# Patient Record
Sex: Female | Born: 1960 | Race: White | Hispanic: No | Marital: Married | Smoking: Never smoker
Health system: Southern US, Community
[De-identification: ages and names within clinical notes are randomized; demographics above are authoritative.]

## PROBLEM LIST (undated history)

## (undated) DIAGNOSIS — I428 Other cardiomyopathies: Secondary | ICD-10-CM

## (undated) DIAGNOSIS — K219 Gastro-esophageal reflux disease without esophagitis: Secondary | ICD-10-CM

## (undated) DIAGNOSIS — I5041 Acute combined systolic (congestive) and diastolic (congestive) heart failure: Secondary | ICD-10-CM

## (undated) DIAGNOSIS — Z9889 Other specified postprocedural states: Secondary | ICD-10-CM

## (undated) DIAGNOSIS — I472 Ventricular tachycardia: Secondary | ICD-10-CM

## (undated) DIAGNOSIS — I509 Heart failure, unspecified: Secondary | ICD-10-CM

## (undated) DIAGNOSIS — E119 Type 2 diabetes mellitus without complications: Secondary | ICD-10-CM

## (undated) DIAGNOSIS — T7840XA Allergy, unspecified, initial encounter: Secondary | ICD-10-CM

## (undated) DIAGNOSIS — I493 Ventricular premature depolarization: Secondary | ICD-10-CM

## (undated) HISTORY — DX: Allergy, unspecified, initial encounter: T78.40XA

## (undated) HISTORY — DX: Gastro-esophageal reflux disease without esophagitis: K21.9

## (undated) HISTORY — DX: Type 2 diabetes mellitus without complications: E11.9

## (undated) HISTORY — PX: TONSILLECTOMY: SUR1361

## (undated) HISTORY — DX: Ventricular premature depolarization: I49.3

---

## 1998-05-29 ENCOUNTER — Other Ambulatory Visit: Admission: RE | Admit: 1998-05-29 | Discharge: 1998-05-29 | Payer: Self-pay | Admitting: Obstetrics & Gynecology

## 1998-07-20 ENCOUNTER — Ambulatory Visit (HOSPITAL_COMMUNITY): Admission: RE | Admit: 1998-07-20 | Discharge: 1998-07-20 | Payer: Self-pay | Admitting: Obstetrics and Gynecology

## 1998-10-05 ENCOUNTER — Inpatient Hospital Stay (HOSPITAL_COMMUNITY): Admission: AD | Admit: 1998-10-05 | Discharge: 1998-10-05 | Payer: Self-pay | Admitting: Obstetrics and Gynecology

## 1998-11-06 ENCOUNTER — Encounter: Admission: RE | Admit: 1998-11-06 | Discharge: 1999-02-04 | Payer: Self-pay | Admitting: *Deleted

## 1998-11-18 ENCOUNTER — Ambulatory Visit (HOSPITAL_COMMUNITY): Admission: RE | Admit: 1998-11-18 | Discharge: 1998-11-18 | Payer: Self-pay | Admitting: Obstetrics and Gynecology

## 1998-12-16 ENCOUNTER — Inpatient Hospital Stay (HOSPITAL_COMMUNITY): Admission: AD | Admit: 1998-12-16 | Discharge: 1998-12-19 | Payer: Self-pay | Admitting: Obstetrics and Gynecology

## 2011-07-28 ENCOUNTER — Ambulatory Visit (INDEPENDENT_AMBULATORY_CARE_PROVIDER_SITE_OTHER): Payer: 59

## 2011-07-28 DIAGNOSIS — Z0000 Encounter for general adult medical examination without abnormal findings: Secondary | ICD-10-CM

## 2011-07-28 DIAGNOSIS — E559 Vitamin D deficiency, unspecified: Secondary | ICD-10-CM

## 2011-07-28 DIAGNOSIS — Z833 Family history of diabetes mellitus: Secondary | ICD-10-CM

## 2011-10-05 ENCOUNTER — Telehealth: Payer: Self-pay

## 2011-10-05 NOTE — Telephone Encounter (Signed)
CPE from Jul 28, 2011 mailed to patient.

## 2011-10-05 NOTE — Telephone Encounter (Signed)
Pt is requesting a copy of her complete pe be mailed to her for her records

## 2012-02-09 LAB — HM MAMMOGRAPHY

## 2012-04-10 ENCOUNTER — Telehealth: Payer: Self-pay | Admitting: Radiology

## 2012-04-10 DIAGNOSIS — R928 Other abnormal and inconclusive findings on diagnostic imaging of breast: Secondary | ICD-10-CM

## 2012-04-10 NOTE — Telephone Encounter (Signed)
Patient has failed to follow up with her abnormal mammogram. I have spoken to patient her insurance company has told her the mammogram she had was covered at 100% this was done through her husbands work on a mobile unit. Now she has gotten a bill for out of network charges from the mammogram. I have advised her to call insurance company about this find out who is in network so we can proceed with a diagnostic mammogram for her. Patient also needs to get copy of mammogram that was done and report. I will call her back about this, or she will call me. I have told her I will give her time to find out what her insurance company will cover.

## 2012-04-18 NOTE — Telephone Encounter (Signed)
I called patient again, about this and she wants me to proceed with the order for diagnostic Mammogram. She still has not worked out everything with [employer]. I have put in order for this, want to let you know. I ordered as diagnostic mammogram with [facility] if something is seen.

## 2012-04-23 ENCOUNTER — Telehealth: Payer: Self-pay

## 2012-04-23 NOTE — Telephone Encounter (Signed)
The patient called to request that [facility] be on the look out for her paperwork from [facility].  The patient would like to be notified when the paperwork comes through and would like a copy of the paperwork as well.  Please call the patient at [phone].

## 2012-04-23 NOTE — Telephone Encounter (Signed)
Spoke with patient. We have the mammogram report from [facility]. Will go in [place] [facility]'s box for review. [facility] will be coming in the mail some time this week as well.

## 2012-04-24 ENCOUNTER — Encounter: Payer: Self-pay | Admitting: Physician Assistant

## 2012-04-24 NOTE — Telephone Encounter (Signed)
It is fine to go ahead and give the patient a copy of her MMG report and then please put it back in my box.  Thanks, angela

## 2012-04-25 NOTE — Telephone Encounter (Signed)
Notified pt that we received the MMG report from [facility] and mailed pt a copy. Pt requests call when we receive the disc as well so that she can p/up and take to [facility].

## 2012-04-29 ENCOUNTER — Encounter: Payer: Self-pay | Admitting: Physician Assistant

## 2012-04-29 NOTE — Telephone Encounter (Signed)
We won't get a disc of it.  She will need to request that from [facility] where she got the mammogram done.

## 2012-04-30 NOTE — Telephone Encounter (Signed)
Called patient, we have gotten report and mailed to her. I told her normally we do not get discs but if we do I will let her know. She is going to call them back and have disc mailed to her.

## 2012-05-02 ENCOUNTER — Encounter: Payer: Self-pay | Admitting: Family Medicine

## 2012-05-03 ENCOUNTER — Encounter: Payer: Self-pay | Admitting: Physician Assistant

## 2012-05-04 ENCOUNTER — Other Ambulatory Visit: Payer: Self-pay | Admitting: Physician Assistant

## 2012-05-04 ENCOUNTER — Ambulatory Visit
Admission: RE | Admit: 2012-05-04 | Discharge: 2012-05-04 | Disposition: A | Discharge status: Home / Self Care | Payer: 59 | Source: Ambulatory Visit | Attending: Physician Assistant | Admitting: Physician Assistant

## 2012-05-04 DIAGNOSIS — R928 Other abnormal and inconclusive findings on diagnostic imaging of breast: Secondary | ICD-10-CM

## 2012-06-27 ENCOUNTER — Encounter: Payer: 59 | Admitting: Family Medicine

## 2012-07-27 ENCOUNTER — Ambulatory Visit (INDEPENDENT_AMBULATORY_CARE_PROVIDER_SITE_OTHER): Payer: 59 | Admitting: Family Medicine

## 2012-07-27 ENCOUNTER — Encounter: Payer: Self-pay | Admitting: Family Medicine

## 2012-07-27 VITALS — BP 120/76 | HR 85 | Temp 98.5°F | Resp 16 | Ht 59.5 in | Wt 154.0 lb

## 2012-07-27 DIAGNOSIS — E7800 Pure hypercholesterolemia, unspecified: Secondary | ICD-10-CM

## 2012-07-27 DIAGNOSIS — Z0000 Encounter for general adult medical examination without abnormal findings: Secondary | ICD-10-CM

## 2012-07-27 DIAGNOSIS — Z8249 Family history of ischemic heart disease and other diseases of the circulatory system: Secondary | ICD-10-CM

## 2012-07-27 DIAGNOSIS — Z8349 Family history of other endocrine, nutritional and metabolic diseases: Secondary | ICD-10-CM

## 2012-07-27 LAB — POCT URINALYSIS DIPSTICK
Bilirubin, UA: NEGATIVE
Blood, UA: NEGATIVE
Glucose, UA: NEGATIVE
Nitrite, UA: NEGATIVE
Urobilinogen, UA: 0.2

## 2012-07-27 LAB — BASIC METABOLIC PANEL
BUN: 12 mg/dL (ref 6–23)
Chloride: 103 mEq/L (ref 96–112)
Potassium: 4.1 mEq/L (ref 3.5–5.3)

## 2012-07-27 LAB — LDL CHOLESTEROL, DIRECT: Direct LDL: 129 mg/dL — ABNORMAL HIGH

## 2012-07-27 LAB — TSH: TSH: 0.938 u[IU]/mL (ref 0.350–4.500)

## 2012-07-27 LAB — T3, FREE: T3, Free: 3.2 pg/mL (ref 2.3–4.2)

## 2012-07-27 NOTE — Patient Instructions (Signed)
Keeping You Healthy  Get These Tests  Blood Pressure- Have your blood pressure checked by your healthcare provider at least once a year.  Normal blood pressure is 120/80.  Weight- Have your body mass index (BMI) calculated to screen for obesity.  BMI is a measure of body fat based on height and weight.  You can calculate your own BMI at https://www.west-esparza.com/  Cholesterol- Have your cholesterol checked every year.  Diabetes- Have your blood sugar checked every year if you have high blood pressure, high cholesterol, a family history of diabetes or if you are overweight.  Pap Smear- Have a pap smear every 1 to 3 years if you have been sexually active.  If you are older than 65 and recent pap smears have been normal you may not need additional pap smears.  In addition, if you have had a hysterectomy  For benign disease additional pap smears are not necessary.  Mammogram-Yearly mammograms are essential for early detection of breast cancer  Screening for Colon Cancer- Colonoscopy starting at age 67. Screening may begin sooner depending on your family history and other health conditions.  Follow up colonoscopy as directed by your Gastroenterologist.  Screening for Osteoporosis- Screening begins at age 58 with bone density scanning, sooner if you are at higher risk for developing Osteoporosis.  Get these medicines  Calcium with Vitamin D- Your body requires 1200-1500 mg of Calcium a day and [phone] IU of Vitamin D a day.  You can only absorb 500 mg of Calcium at a time therefore Calcium must be taken in 2 or 3 separate doses throughout the day.  Hormones- Hormone therapy has been associated with increased risk for certain cancers and heart disease.  Talk to your healthcare provider about if you need relief from menopausal symptoms.  Aspirin- Ask your healthcare provider about taking Aspirin to prevent Heart Disease and Stroke.  Get these Immuniztions  Flu shot- Every fall  Pneumonia  shot- Once after the age of 91; if you are younger ask your healthcare provider if you need a pneumonia shot.  Tetanus- Every ten years. You are allergic to this vaccine.  Zostavax- Once after the age of 26 to prevent shingles.  Take these steps  Don't smoke- Your healthcare provider can help you quit. For tips on how to quit, ask your healthcare provider or go to www.smokefree.gov or call 1-800 QUIT-NOW.  Be physically active- Exercise 5 days a week for a minimum of 30 minutes.  If you are not already physically active, start slow and gradually work up to 30 minutes of moderate physical activity.  Try walking, dancing, bike riding, swimming, etc.  Eat a healthy diet- Eat a variety of healthy foods such as fruits, vegetables, whole grains, low fat milk, low fat cheeses, yogurt, lean meats, chicken, fish, eggs, dried beans, tofu, etc.  For more information go to www.thenutritionsource.org  Dental visit- Brush and floss teeth twice daily; visit your dentist twice a year.  Eye exam- Visit your Optometrist or Ophthalmologist yearly.  Drink alcohol in moderation- Limit alcohol intake to one drink or less a day.  Never drink and drive.  Depression- Your emotional health is as important as your physical health.  If you're feeling down or losing interest in things you normally enjoy, please talk to your healthcare provider.  Seat Belts- can save your life; always wear one  Smoke/Carbon Monoxide detectors- These detectors need to be installed on the appropriate level of your home.  Replace batteries at least once  a year.  Violence- If anyone is threatening or hurting you, please tell your healthcare provider.  Living Will/ Health care power of attorney- Discuss with your healthcare provider and family.   ECG (EKG) today is normal.

## 2012-08-01 ENCOUNTER — Encounter: Payer: Self-pay | Admitting: Family Medicine

## 2012-08-01 DIAGNOSIS — E7800 Pure hypercholesterolemia, unspecified: Secondary | ICD-10-CM | POA: Insufficient documentation

## 2012-08-01 DIAGNOSIS — Z8349 Family history of other endocrine, nutritional and metabolic diseases: Secondary | ICD-10-CM | POA: Insufficient documentation

## 2012-08-01 DIAGNOSIS — E119 Type 2 diabetes mellitus without complications: Secondary | ICD-10-CM | POA: Insufficient documentation

## 2012-08-01 DIAGNOSIS — Z8249 Family history of ischemic heart disease and other diseases of the circulatory system: Secondary | ICD-10-CM | POA: Insufficient documentation

## 2012-08-01 NOTE — Progress Notes (Signed)
  Subjective:    Patient ID: Emily [name], female    DOB: 1960/11/08, 52 y.o.   MRN: [id number]  HPI This 52 y.o. Cauc female is here for CPE. She takes not prescription medications. Last PAP  was 2 years ago. MMG was 02/08/2012 (normal). She had Bone Density study > 12 years ago.   PMHx is notable for Gestational Diabetes.     Review of Systems  HENT: Positive for neck pain and sinus pressure.   All other systems reviewed and are negative.       Objective:   Physical Exam  Nursing note and vitals reviewed. Constitutional: She is oriented to person, place, and time. Vital signs are normal. She appears well-developed and well-nourished.  HENT:  Head: Normocephalic and atraumatic.  Right Ear: Hearing, tympanic membrane, external ear and ear canal normal.  Left Ear: Hearing, tympanic membrane, external ear and ear canal normal.  Nose: No nasal deformity or septal deviation. Right sinus exhibits no maxillary sinus tenderness and no frontal sinus tenderness. Left sinus exhibits no maxillary sinus tenderness and no frontal sinus tenderness.  Mouth/Throat: Uvula is midline, oropharynx is clear and moist and mucous membranes are normal. No oral lesions. Normal dentition. No dental caries.  Eyes: Conjunctivae normal, EOM and lids are normal. Pupils are equal, round, and reactive to light. No scleral icterus.  Fundoscopic exam:      The right eye shows no arteriolar narrowing, no AV nicking and no papilledema. The right eye shows red reflex.      The left eye shows no arteriolar narrowing, no AV nicking and no papilledema. The left eye shows red reflex. Neck: Normal range of motion. Neck supple. No thyromegaly present.  Cardiovascular: Normal rate, regular rhythm, normal heart sounds and intact distal pulses.  Exam reveals no gallop and no friction rub.   No murmur heard. Pulmonary/Chest: Effort normal and breath sounds normal. No respiratory distress. She exhibits no tenderness.  Abdominal:  Soft. Normal appearance and bowel sounds are normal. She exhibits no distension, no pulsatile liver, no abdominal bruit, no pulsatile midline mass and no mass. There is no hepatosplenomegaly. There is no tenderness. There is no guarding and no CVA tenderness.  Genitourinary: Rectum normal. Rectal exam shows no fissure, no mass, no tenderness and anal tone normal. Guaiac negative stool. No breast swelling, tenderness, discharge or bleeding. There is no rash or lesion on the right labia. There is no rash or lesion on the left labia.       NEFG; Pelvic/PAP not performed.  Musculoskeletal: Normal range of motion. She exhibits no edema.  Lymphadenopathy:    She has no cervical adenopathy.  Neurological: She is alert and oriented to person, place, and time. She has normal reflexes. No cranial nerve deficit. She exhibits normal muscle tone. Coordination normal.  Skin: Skin is warm and dry. No rash noted. No erythema. No pallor.  Psychiatric: She has a normal mood and affect. Her behavior is normal. Judgment and thought content normal.    ECG: NSR; normal ECG.      Assessment & Plan:   1. Annual physical exam   Basic metabolic panel, IFOBT POC (occult bld, rslt in office)  2. Family history of early CAD  EKG 12-Lead  3. Elevated LDL cholesterol level  LDL Cholesterol, Direct  4. Family history of thyroid disorder  TSH, T3, Free

## 2012-08-06 ENCOUNTER — Encounter: Payer: Self-pay | Admitting: Family Medicine

## 2012-09-15 ENCOUNTER — Other Ambulatory Visit: Payer: Self-pay

## 2013-06-06 ENCOUNTER — Other Ambulatory Visit: Payer: Self-pay

## 2013-07-30 ENCOUNTER — Ambulatory Visit (INDEPENDENT_AMBULATORY_CARE_PROVIDER_SITE_OTHER): Payer: 59 | Admitting: Family Medicine

## 2013-07-30 ENCOUNTER — Encounter: Payer: Self-pay | Admitting: Family Medicine

## 2013-07-30 VITALS — BP 120/82 | HR 85 | Temp 98.3°F | Resp 16 | Ht 59.5 in | Wt 144.2 lb

## 2013-07-30 DIAGNOSIS — E559 Vitamin D deficiency, unspecified: Secondary | ICD-10-CM

## 2013-07-30 DIAGNOSIS — Z0000 Encounter for general adult medical examination without abnormal findings: Secondary | ICD-10-CM

## 2013-07-30 DIAGNOSIS — L989 Disorder of the skin and subcutaneous tissue, unspecified: Secondary | ICD-10-CM

## 2013-07-30 DIAGNOSIS — Z01419 Encounter for gynecological examination (general) (routine) without abnormal findings: Secondary | ICD-10-CM

## 2013-07-30 LAB — LIPID PANEL
Cholesterol: 193 mg/dL (ref 0–200)
LDL Cholesterol: 117 mg/dL — ABNORMAL HIGH (ref 0–99)
Total CHOL/HDL Ratio: 3.9 Ratio
VLDL: 27 mg/dL (ref 0–40)

## 2013-07-30 LAB — COMPLETE METABOLIC PANEL WITH GFR
ALT: 18 U/L (ref 0–35)
AST: 19 U/L (ref 0–37)
Albumin: 4.5 g/dL (ref 3.5–5.2)
Alkaline Phosphatase: 58 U/L (ref 39–117)
BUN: 16 mg/dL (ref 6–23)
Calcium: 9.4 mg/dL (ref 8.4–10.5)
Chloride: 105 mEq/L (ref 96–112)
Potassium: 4.9 mEq/L (ref 3.5–5.3)
Sodium: 142 mEq/L (ref 135–145)
Total Bilirubin: 0.4 mg/dL (ref 0.3–1.2)
Total Protein: 6.9 g/dL (ref 6.0–8.3)

## 2013-07-30 LAB — CBC WITH DIFFERENTIAL/PLATELET
Eosinophils Absolute: 0.1 10*3/uL (ref 0.0–0.7)
Lymphocytes Relative: 34 % (ref 12–46)
Lymphs Abs: 1.7 10*3/uL (ref 0.7–4.0)
MCH: 30 pg (ref 26.0–34.0)
MCV: 85.5 fL (ref 78.0–100.0)
Monocytes Absolute: 0.3 10*3/uL (ref 0.1–1.0)
Neutro Abs: 2.9 10*3/uL (ref 1.7–7.7)
Neutrophils Relative %: 56 % (ref 43–77)
Platelets: 293 10*3/uL (ref 150–400)
RBC: 4.96 MIL/uL (ref 3.87–5.11)
WBC: 5.1 10*3/uL (ref 4.0–10.5)

## 2013-07-30 LAB — IFOBT (OCCULT BLOOD): IFOBT: NEGATIVE

## 2013-07-30 NOTE — Patient Instructions (Addendum)
Keeping You Healthy  Get These Tests  Blood Pressure- Have your blood pressure checked by your healthcare provider at least once a year.  Normal blood pressure is 120/80.  Weight- Have your body mass index (BMI) calculated to screen for obesity.  BMI is a measure of body fat based on height and weight.  You can calculate your own BMI at https://www.west-esparza.com/  Cholesterol- Have your cholesterol checked every year.  Diabetes- Have your blood sugar checked every year if you have high blood pressure, high cholesterol, a family history of diabetes or if you are overweight.  Pap Smear- Have a pap smear every 1 to 3 years if you have been sexually active.  If you are older than 65 and recent pap smears have been normal you may not need additional pap smears.  In addition, if you have had a hysterectomy  For benign disease additional pap smears are not necessary.  Mammogram-Yearly mammograms are essential for early detection of breast cancer  Screening for Colon Cancer- Colonoscopy starting at age 72. Screening may begin sooner depending on your family history and other health conditions.  Follow up colonoscopy as directed by your Gastroenterologist. Your stool specimen today was "negative" but I encourage you to have a colonoscopy; the initial office visit is for consultation with the GI specialist.  Screening for Osteoporosis- Screening begins at age 14 with bone density scanning, sooner if you are at higher risk for developing Osteoporosis.  Get these medicines  Calcium with Vitamin D- Your body requires 1200-1500 mg of Calcium a day and [phone] IU of Vitamin D a day.  You can only absorb 500 mg of Calcium at a time therefore Calcium must be taken in 2 or 3 separate doses throughout the day.  Hormones- Hormone therapy has been associated with increased risk for certain cancers and heart disease.  Talk to your healthcare provider about if you need relief from menopausal symptoms.  Aspirin-  Ask your healthcare provider about taking Aspirin to prevent Heart Disease and Stroke.  Get these Immuniztions  Flu shot- Every fall  Pneumonia shot- Once after the age of 70; if you are younger ask your healthcare provider if you need a pneumonia shot.  Tetanus- Every ten years. You are allergic to Tetanus.  Zostavax- Once after the age of 27 to prevent shingles.  Take these steps  Don't smoke- Your healthcare provider can help you quit. For tips on how to quit, ask your healthcare provider or go to www.smokefree.gov or call 1-800 QUIT-NOW.  Be physically active- Exercise 5 days a week for a minimum of 30 minutes.  If you are not already physically active, start slow and gradually work up to 30 minutes of moderate physical activity.  Try walking, dancing, bike riding, swimming, etc.  Eat a healthy diet- Eat a variety of healthy foods such as fruits, vegetables, whole grains, low fat milk, low fat cheeses, yogurt, lean meats, chicken, fish, eggs, dried beans, tofu, etc.  For more information go to www.thenutritionsource.org  Dental visit- Brush and floss teeth twice daily; visit your dentist twice a year.  Eye exam- Visit your Optometrist or Ophthalmologist yearly.  Drink alcohol in moderation- Limit alcohol intake to one drink or less a day.  Never drink and drive.  Depression- Your emotional health is as important as your physical health.  If you're feeling down or losing interest in things you normally enjoy, please talk to your healthcare provider.  Seat Belts- can save your life; always wear  one  Smoke/Carbon Clinical cytogeneticist- These detectors need to be installed on the appropriate level of your home.  Replace batteries at least once a year.  Violence- If anyone is threatening or hurting you, please tell your healthcare provider.  Living Will/ Health care power of attorney- Discuss with your healthcare provider and family.     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 Although the condition is often called tennis or golfer's elbow, it is caused by any repetitive action performed by your elbow. HOME CARE INSTRUCTIONS  If the condition has been short lived, rest may be the only treatment required. Using your opposite hand or arm to perform the task may help. Even changing your grip may help rest the extremity. These may even prevent the condition from recurring.  Longer standing problems, however, will often be relieved faster by:  Using anti-inflammatory agents.  Applying ice packs for 30 minutes at the end of the working day, at bed time, or when activities are finished.  Your caregiver may also have you wear a splint or sling. This will allow the inflamed tendon to heal. At times, steroid injections aided with a local anesthetic will be required along with splinting for 1 to 2 weeks. Two to three steroid injections will often solve the problem. In some long standing cases, the inflamed tendon does not respond to conservative (non-surgical) therapy. Then surgery may be required to repair it. MAKE SURE YOU:   Understand these instructions.  Will watch your condition.  Will get help right away if you are not doing well or get worse. Document Released: 07/18/2005 Document Revised: 10/10/2011 Document Reviewed: 03/05/2008 [facility] Patient Information 2014 [place], [place].   I have placed an order for refferal to Dr. Theotis Barrio office - [facility]. A staff member will contact you with details about the appointment which may be sometime in late January or February 2015.  As I discussed with you, there was an abnormality seen on the cervix; if your PAP results show any irregularities or abnormalities, then I will suggest you see a GYN specialist for further  evaluation.

## 2013-07-30 NOTE — Progress Notes (Signed)
Subjective:    Patient ID: Emily [name], female    DOB: [birth year], 52 y.o.   MRN: [id number]  HPI  This 52 y.o. Cauc female is here for CPE w/ PAP. She is having perimenopausal symptoms with irregular menses, night sweats and mental fogginess.  She has some R elbow pain w/ distant hx of a fall earlier this year; she is R-handed.  HCM:  MMG- Current.            Colonoscopy- Declined at this time.            Vision- Current; wears corrective lenses.             IMM- Current but allergic to Tetanus.  Patient Active Problem List   Diagnosis Date Noted  . Family history of early CAD 08/01/2012  . Elevated LDL cholesterol level 08/01/2012  . Family history of thyroid disorder 08/01/2012  . History of gestational diabetes mellitus, not pregnant 08/01/2012   PMHx, Soc and Fam Hx reviewed. Pt takes no prescription medications.   Review of Systems  Constitutional: Positive for diaphoresis and fatigue. Negative for activity change, appetite change and unexpected weight change.  HENT: Positive for sinus pressure.   Eyes: Negative.   Respiratory: Negative.   Cardiovascular: Negative.   Gastrointestinal: Negative.   Endocrine: Positive for polyuria. Negative for polydipsia and polyphagia.  Genitourinary: Negative.   Musculoskeletal: Positive for arthralgias.  Skin: Negative.   Allergic/Immunologic: Positive for environmental allergies.  Neurological: Negative.   Hematological: Negative.   Psychiatric/Behavioral: Negative.        Objective:   Physical Exam  Nursing note and vitals reviewed. Constitutional: She is oriented to person, place, and time. Vital signs are normal. She appears well-developed and well-nourished. No distress.  HENT:  Head: Normocephalic and atraumatic.  Right Ear: Hearing, tympanic membrane, external ear and ear canal normal.  Left Ear: Hearing, tympanic membrane, external ear and ear canal normal.  Nose: Nose normal. No mucosal edema, nasal deformity or  septal deviation.  Mouth/Throat: Uvula is midline, oropharynx is clear and moist and mucous membranes are normal. She does not have dentures. No oral lesions. No uvula swelling or dental caries.  Eyes: Conjunctivae, EOM and lids are normal. Pupils are equal, round, and reactive to light. No scleral icterus.  Fundoscopic exam:      The right eye shows no arteriolar narrowing, no AV nicking and no papilledema. The right eye shows red reflex.       The left eye shows no arteriolar narrowing, no AV nicking and no papilledema. The left eye shows red reflex.  Neck: Trachea normal, normal range of motion and full passive range of motion without pain. Neck supple. No JVD present. No spinous process tenderness and no muscular tenderness present. No mass and no thyromegaly present.  Cardiovascular: Normal rate, regular rhythm, S1 normal, S2 normal, normal heart sounds, intact distal pulses and normal pulses.   No extrasystoles are present. PMI is not displaced.  Exam reveals no gallop and no friction rub.   No murmur heard. Pulmonary/Chest: Effort normal and breath sounds normal. No respiratory distress. She has no decreased breath sounds. She has no wheezes. Right breast exhibits no inverted nipple, no mass, no nipple discharge, no skin change and no tenderness. Left breast exhibits no inverted nipple, no mass, no nipple discharge, no skin change and no tenderness. Breasts are symmetrical.  Abdominal: Soft. Normal appearance and bowel sounds are normal. She exhibits no distension, no pulsatile midline mass  and no mass. There is no hepatosplenomegaly. There is no tenderness. There is no guarding and no CVA tenderness. No hernia.  Genitourinary: Rectum normal, vagina normal and uterus normal. Rectal exam shows no external hemorrhoid, no fissure, no mass, no tenderness and anal tone normal. Guaiac negative stool. There is no rash, tenderness or lesion on the right labia. There is no rash, tenderness or lesion on the  left labia. Uterus is not deviated, not enlarged and not fixed. Cervix exhibits no discharge and no friability. Right adnexum displays no mass, no tenderness and no fullness. Left adnexum displays tenderness. Left adnexum displays no mass and no fullness.  Cervical discoloration and lesions @ 11-12 o'clock- beige cystic lesions; polypoid mass @ L side of os.  Musculoskeletal: Normal range of motion. She exhibits no edema and no tenderness.       Right elbow: She exhibits normal range of motion, no swelling, no effusion and no deformity. No radial head and no medial epicondyle tenderness noted.  Lymphadenopathy:       Head (right side): No submental, no submandibular, no tonsillar, no posterior auricular and no occipital adenopathy present.       Head (left side): No submental, no submandibular, no tonsillar, no posterior auricular and no occipital adenopathy present.    She has no cervical adenopathy.    She has no axillary adenopathy.       Right: No inguinal, no supraclavicular and no epitrochlear adenopathy present.       Left: No inguinal, no supraclavicular and no epitrochlear adenopathy present.  Neurological: She is alert and oriented to person, place, and time. She has normal strength and normal reflexes. She displays no atrophy and no tremor. No cranial nerve deficit or sensory deficit. She exhibits normal muscle tone. Coordination and gait normal. She displays no Babinski's sign on the right side. She displays no Babinski's sign on the left side.  Skin: Skin is warm, dry and intact. Lesion noted. No bruising, no ecchymosis and no rash noted. She is not diaphoretic. No cyanosis. No pallor. Nails show no clubbing.  Multiple lesions- skin tags, seb k's, nevi (red and brown).  Psychiatric: She has a normal mood and affect. Her speech is normal and behavior is normal. Judgment and thought content normal. Cognition and memory are normal.    Results for orders placed in visit on 07/30/13  IFOBT  (OCCULT BLOOD)      Result Value Range   IFOBT Negative        Assessment & Plan:  Routine general medical examination at a health care facility - Plan: IFOBT POC (occult bld, rslt in office), COMPLETE METABOLIC PANEL WITH GFR, Lipid panel, CBC with Differential  Encounter for cervical Pap smear with pelvic exam - Plan: Pap IG w/ reflex to HPV when ASC-U Pt advised that cervical lesions may warrant GYN evaluation.  Unspecified vitamin D deficiency - Plan: Vit D  25 hydroxy (rtn osteoporosis monitoring)  Skin lesions, generalized - Plan: Ambulatory referral to Dermatology  (Pt would like copy of lab results mailed to her).

## 2013-07-30 NOTE — Progress Notes (Deleted)
   Subjective:    Patient ID: Emily [name], female    DOB: 08-24-1960, 52 y.o.   MRN: [id number]  HPI    Review of Systems  Constitutional: Positive for diaphoresis and fatigue.  HENT: Positive for sinus pressure.   Eyes: Negative.   Respiratory: Negative.   Cardiovascular: Negative.   Gastrointestinal: Negative.   Endocrine: Positive for polyuria.  Genitourinary: Negative.   Musculoskeletal: Positive for arthralgias.  Skin: Negative.   Allergic/Immunologic: Positive for environmental allergies.  Neurological: Negative.   Hematological: Negative.   Psychiatric/Behavioral: Negative.        Objective:   Physical Exam        Assessment & Plan:

## 2013-07-31 LAB — PAP IG W/ RFLX HPV ASCU

## 2013-07-31 LAB — VITAMIN D 25 HYDROXY (VIT D DEFICIENCY, FRACTURES): Vit D, 25-Hydroxy: 35 ng/mL (ref 30–89)

## 2013-08-04 ENCOUNTER — Encounter: Payer: Self-pay | Admitting: Family Medicine

## 2014-07-04 ENCOUNTER — Encounter: Payer: Self-pay | Admitting: Family Medicine

## 2014-07-04 ENCOUNTER — Ambulatory Visit (INDEPENDENT_AMBULATORY_CARE_PROVIDER_SITE_OTHER): Payer: 59 | Admitting: Family Medicine

## 2014-07-04 VITALS — BP 130/74 | HR 88 | Temp 98.5°F | Resp 16 | Ht 60.25 in | Wt 153.0 lb

## 2014-07-04 DIAGNOSIS — G5602 Carpal tunnel syndrome, left upper limb: Secondary | ICD-10-CM

## 2014-07-04 DIAGNOSIS — G5601 Carpal tunnel syndrome, right upper limb: Secondary | ICD-10-CM

## 2014-07-04 DIAGNOSIS — R7309 Other abnormal glucose: Secondary | ICD-10-CM

## 2014-07-04 DIAGNOSIS — Z1383 Encounter for screening for respiratory disorder NEC: Secondary | ICD-10-CM

## 2014-07-04 DIAGNOSIS — Z130 Encounter for screening for diseases of the blood and blood-forming organs and certain disorders involving the immune mechanism: Secondary | ICD-10-CM

## 2014-07-04 DIAGNOSIS — Z1239 Encounter for other screening for malignant neoplasm of breast: Secondary | ICD-10-CM

## 2014-07-04 DIAGNOSIS — E559 Vitamin D deficiency, unspecified: Secondary | ICD-10-CM

## 2014-07-04 DIAGNOSIS — Z1389 Encounter for screening for other disorder: Secondary | ICD-10-CM

## 2014-07-04 DIAGNOSIS — R7303 Prediabetes: Secondary | ICD-10-CM

## 2014-07-04 DIAGNOSIS — G5603 Carpal tunnel syndrome, bilateral upper limbs: Secondary | ICD-10-CM

## 2014-07-04 DIAGNOSIS — Z0000 Encounter for general adult medical examination without abnormal findings: Secondary | ICD-10-CM

## 2014-07-04 DIAGNOSIS — Z136 Encounter for screening for cardiovascular disorders: Secondary | ICD-10-CM

## 2014-07-04 DIAGNOSIS — Z1329 Encounter for screening for other suspected endocrine disorder: Secondary | ICD-10-CM

## 2014-07-04 DIAGNOSIS — Z1211 Encounter for screening for malignant neoplasm of colon: Secondary | ICD-10-CM

## 2014-07-04 LAB — COMPREHENSIVE METABOLIC PANEL
ALBUMIN: 4.3 g/dL (ref 3.5–5.2)
ALT: 26 U/L (ref 0–35)
AST: 21 U/L (ref 0–37)
Alkaline Phosphatase: 65 U/L (ref 39–117)
BUN: 16 mg/dL (ref 6–23)
CHLORIDE: 102 meq/L (ref 96–112)
CO2: 28 meq/L (ref 19–32)
Calcium: 9.2 mg/dL (ref 8.4–10.5)
Creat: 0.66 mg/dL (ref 0.50–1.10)
Glucose, Bld: 119 mg/dL — ABNORMAL HIGH (ref 70–99)
Potassium: 4 mEq/L (ref 3.5–5.3)
Sodium: 137 mEq/L (ref 135–145)
TOTAL PROTEIN: 7 g/dL (ref 6.0–8.3)
Total Bilirubin: 0.8 mg/dL (ref 0.2–1.2)

## 2014-07-04 LAB — CBC WITH DIFFERENTIAL/PLATELET
BASOS ABS: 0 10*3/uL (ref 0.0–0.1)
BASOS PCT: 0 % (ref 0–1)
Eosinophils Absolute: 0.1 10*3/uL (ref 0.0–0.7)
Eosinophils Relative: 2 % (ref 0–5)
HCT: 41 % (ref 36.0–46.0)
HEMOGLOBIN: 14.3 g/dL (ref 12.0–15.0)
LYMPHS ABS: 1.7 10*3/uL (ref 0.7–4.0)
Lymphocytes Relative: 28 % (ref 12–46)
MCH: 29.7 pg (ref 26.0–34.0)
MCHC: 34.9 g/dL (ref 30.0–36.0)
MCV: 85.1 fL (ref 78.0–100.0)
MPV: 8.6 fL — AB (ref 9.4–12.4)
Monocytes Absolute: 0.4 10*3/uL (ref 0.1–1.0)
Monocytes Relative: 7 % (ref 3–12)
NEUTROS PCT: 63 % (ref 43–77)
Neutro Abs: 3.7 10*3/uL (ref 1.7–7.7)
PLATELETS: 260 10*3/uL (ref 150–400)
RBC: 4.82 MIL/uL (ref 3.87–5.11)
RDW: 13.4 % (ref 11.5–15.5)
WBC: 5.9 10*3/uL (ref 4.0–10.5)

## 2014-07-04 LAB — LIPID PANEL
Cholesterol: 210 mg/dL — ABNORMAL HIGH (ref 0–200)
HDL: 57 mg/dL (ref >39)
LDL CALC: 134 mg/dL — AB (ref 0–99)
Total CHOL/HDL Ratio: 3.7 Ratio
Triglycerides: 97 mg/dL (ref <150)
VLDL: 19 mg/dL (ref 0–40)

## 2014-07-04 LAB — VITAMIN D 25 HYDROXY (VIT D DEFICIENCY, FRACTURES): VIT D 25 HYDROXY: 36 ng/mL (ref 30–100)

## 2014-07-04 LAB — TSH: TSH: 1.018 u[IU]/mL (ref 0.350–4.500)

## 2014-07-04 LAB — POCT GLYCOSYLATED HEMOGLOBIN (HGB A1C): HEMOGLOBIN A1C: 5.9

## 2014-07-04 NOTE — Progress Notes (Addendum)
Subjective:  This chart was scribed for Emily Knapp, MD by Lowella Petties, ED Scribe. The patient was seen in room 26. Patient's care was started at 9:39 AM.   Patient ID: Emily [name], female    DOB: Dec 26, 1960, 53 y.o.   MRN: [id number] Chief Complaint  Patient presents with  . Annual Exam    no pap    HPI HPI Comments: Emily [name] is a 53 y.o. female who presents to the [facility] for a routine checkup. She was seen here one year previously for a routine physical. She is allergic to Tetanus vaccine.   She had a pap smear done at that time and it was normal. She will have her pap smear repeated in December 2017.   Her last mammogram was in October 2013 and was normal. She agrees to have another one done when her insurance will cover it.  She had routine lab work done last year with normal lipid panel, vitamin D, CBC, and negative hemacult. However, her glucose was mildly elevated at 118. She states that she takes a complete multi-vitamin which includes vitamin B. She states that she takes 2000 mg of vitamin D, which was increased from 1000 mg last year.  She has not had a prior colonoscopy, but negative Hemosures annually. She denies colonoscopy and agrees to repeat a Hemosure at home.   Today she reports a new concern about tingling in her hands bilaterally that is exacerbated by typing or writing and is worst at night. She states that she occasionally drops things as well, and that this might be due to increased hand weakness bilaterally. She reports taking Tylenol with minimal relief.    She reports a new allergy to wasp stings that does not include difficulty breathing. She reports concern about her family history stating that her sister has a history of graves disease. She states that her mother died of heart failure at age 76 and her sister had a postpartum MI at age 83 due to a postpartum. She agrees to try MegaRed at home.   No past medical history on  file.   Current Outpatient Prescriptions on File Prior to Visit  Medication Sig Dispense Refill  . aspirin 81 MG tablet Take 81 mg by mouth daily.    . cholecalciferol (VITAMIN D) 1000 UNITS tablet Take 1,000 Units by mouth daily.    . Multiple Vitamins-Minerals (MULTIVITAMIN WITH MINERALS) tablet Take 1 tablet by mouth daily.    Marland Kitchen thiamine (VITAMIN B-1) 100 MG tablet Take 100 mg by mouth daily.     No current facility-administered medications on file prior to visit.   Allergies  Allergen Reactions  . Tetanus Toxoids     Big red rash and swelling in the muscle   Past Surgical History  Procedure Laterality Date  . Tonsillectomy      when she was 3   History   Social History  . Marital Status: Married    Spouse Name: N/A    Number of Children: N/A  . Years of Education: N/A   Social History Main Topics  . Smoking status: Never Smoker   . Smokeless tobacco: Never Used  . Alcohol Use: No  . Drug Use: No  . Sexual Activity: None   Other Topics Concern  . None   Social History Narrative   Married.  Education: [employer].         Family History  Problem Relation Age of Onset  .  Heart disease Mother   . COPD Mother   . Heart disease Father   . Diabetes Father   . COPD Father   . Vision loss Father   . Cancer Father     skin  . Hyperlipidemia Father   . Heart disease Sister   . Graves' disease Sister   . Diabetes Sister   . Hyperlipidemia Sister   . Heart disease Brother   . Hyperlipidemia Brother   . Cancer Paternal Grandmother      Review of Systems  Constitutional: Negative for fever, chills, appetite change and fatigue.  HENT: Positive for congestion.   Respiratory: Negative for chest tightness.   Cardiovascular: Negative for chest pain.  Gastrointestinal: Negative for nausea, vomiting, abdominal pain and diarrhea.  Genitourinary: Negative for dysuria, urgency, frequency and vaginal discharge.  Neurological: Positive for numbness (tingling in hands  bilaterally).    BP 130/74 mmHg  Pulse 88  Temp(Src) 98.5 F (36.9 C) (Oral)  Resp 16  Ht 5' 0.25" (1.53 m)  Wt 153 lb (69.4 kg)  BMI 29.65 kg/m2  SpO2 98%  Objective:  Physical Exam  Constitutional: She is oriented to person, place, and time. She appears well-developed and well-nourished. No distress.  HENT:  Head: Normocephalic and atraumatic.  Left Ear: Tympanic membrane normal.  Mouth/Throat: Oropharynx is clear and moist. No oropharyngeal exudate.  Right TM w/ cerumen  Eyes: Conjunctivae and EOM are normal. Pupils are equal, round, and reactive to light.  Neck: Neck supple. No tracheal deviation present. No thyroid mass and no thyromegaly present.  Cardiovascular: Normal rate, regular rhythm and normal heart sounds.   No murmur heard. Pulmonary/Chest: Effort normal and breath sounds normal. No respiratory distress. She has no wheezes. She has no rales.  Abdominal: Soft. Bowel sounds are normal. There is no tenderness.  Musculoskeletal: Normal range of motion.  Negative Tinel's sign on the left and positive on the right. Positive Phalen's and reverse Phalen's bilaterally, right worse than left. Strength reduced to 4+/5 on the right.   Neurological: She is alert and oriented to person, place, and time.  Skin: Skin is warm and dry.  Psychiatric: She has a normal mood and affect. Her behavior is normal.  Nursing note and vitals reviewed.  [facility] reading (PRIMARY) by  Dr. Brigitte Pulse. EKG: NSR, no acute ischemic changes. Assessment & Plan:  Routine medical exam - Plan: EKG 12-Lead, IFOBT POC (occult bld, rslt in office)  Elevated glucose - Plan: POCT glycosylated hemoglobin (Hb A1C)  Screening for cardiovascular, respiratory, and genitourinary diseases - Plan: Comprehensive metabolic panel, Lipid panel, EKG 12-Lead - LDL mildly elev but HDL excellent so ASCVD risk1.9% - cont tlc to control cholesterol.  Screening for deficiency anemia - Plan: CBC with Differential  Screening for  thyroid disorder - Plan: TSH  Bilateral carpal tunnel syndrome - start wrist braces at night - if no sig improvement after sev wks will refer to hand surg  Vitamin D deficiency - Plan: Vit D  25 hydroxy (rtn osteoporosis monitoring)  Special screening for malignant neoplasms, colon  Screening for breast cancer - Plan: MM Digital Screening  Prediabetes - a1c 5.9   I personally performed the services described in this documentation, which was scribed in my presence. The recorded information has been reviewed and considered, and addended by me as needed.  Delman Cheadle, MD MPH  Results for orders placed or performed in visit on 07/04/14  CBC with Differential  Result Value Ref Range   WBC 5.9  4.0 - 10.5 K/uL   RBC 4.82 3.87 - 5.11 MIL/uL   Hemoglobin 14.3 12.0 - 15.0 g/dL   HCT 41.0 36.0 - 46.0 %   MCV 85.1 78.0 - 100.0 fL   MCH 29.7 26.0 - 34.0 pg   MCHC 34.9 30.0 - 36.0 g/dL   RDW 13.4 11.5 - 15.5 %   Platelets 260 150 - 400 K/uL   MPV 8.6 (L) 9.4 - 12.4 fL   Neutrophils Relative % 63 43 - 77 %   Neutro Abs 3.7 1.7 - 7.7 K/uL   Lymphocytes Relative 28 12 - 46 %   Lymphs Abs 1.7 0.7 - 4.0 K/uL   Monocytes Relative 7 3 - 12 %   Monocytes Absolute 0.4 0.1 - 1.0 K/uL   Eosinophils Relative 2 0 - 5 %   Eosinophils Absolute 0.1 0.0 - 0.7 K/uL   Basophils Relative 0 0 - 1 %   Basophils Absolute 0.0 0.0 - 0.1 K/uL   Smear Review Criteria for review not met   Comprehensive metabolic panel  Result Value Ref Range   Sodium 137 135 - 145 mEq/L   Potassium 4.0 3.5 - 5.3 mEq/L   Chloride 102 96 - 112 mEq/L   CO2 28 19 - 32 mEq/L   Glucose, Bld 119 (H) 70 - 99 mg/dL   BUN 16 6 - 23 mg/dL   Creat 0.66 0.50 - 1.10 mg/dL   Total Bilirubin 0.8 0.2 - 1.2 mg/dL   Alkaline Phosphatase 65 39 - 117 U/L   AST 21 0 - 37 U/L   ALT 26 0 - 35 U/L   Total Protein 7.0 6.0 - 8.3 g/dL   Albumin 4.3 3.5 - 5.2 g/dL   Calcium 9.2 8.4 - 10.5 mg/dL  TSH  Result Value Ref Range   TSH 1.018 0.350 - 4.500  uIU/mL  Lipid panel  Result Value Ref Range   Cholesterol 210 (H) 0 - 200 mg/dL   Triglycerides 97 <150 mg/dL   HDL 57 >39 mg/dL   Total CHOL/HDL Ratio 3.7 Ratio   VLDL 19 0 - 40 mg/dL   LDL Cholesterol 134 (H) 0 - 99 mg/dL  Vit D  25 hydroxy (rtn osteoporosis monitoring)  Result Value Ref Range   Vit D, 25-Hydroxy 36 30 - 100 ng/mL  POCT glycosylated hemoglobin (Hb A1C)  Result Value Ref Range   Hemoglobin A1C 5.9   IFOBT POC (occult bld, rslt in office)  Result Value Ref Range   IFOBT Negative

## 2014-07-04 NOTE — Patient Instructions (Addendum)
Carpal Tunnel Syndrome The carpal tunnel is a narrow area located on the palm side of your wrist. The tunnel is formed by the wrist bones and ligaments. Nerves, blood vessels, and tendons pass through the carpal tunnel. Repeated wrist motion or certain diseases may cause swelling within the tunnel. This swelling pinches the main nerve in the wrist (median nerve) and causes the painful hand and arm condition called carpal tunnel syndrome. CAUSES   Repeated wrist motions.  Wrist injuries.  Certain diseases like arthritis, diabetes, alcoholism, hyperthyroidism, and kidney failure.  Obesity.  Pregnancy. SYMPTOMS   A "pins and needles" feeling in your fingers or hand, especially in your thumb, index and middle fingers.  Tingling or numbness in your fingers or hand.  An aching feeling in your entire arm, especially when your wrist and elbow are bent for long periods of time.  Wrist pain that goes up your arm to your shoulder.  Pain that goes down into your palm or fingers.  A weak feeling in your hands. DIAGNOSIS  Your health care provider will take your history and perform a physical exam. An electromyography test may be needed. This test measures electrical signals sent out by your nerves into the muscles. The electrical signals are usually slowed by carpal tunnel syndrome. You may also need X-rays. TREATMENT  Carpal tunnel syndrome may clear up by itself. Your health care provider may recommend a wrist splint or medicine such as a nonsteroidal anti-inflammatory medicine. Cortisone injections may help. Sometimes, surgery may be needed to free the pinched nerve.  HOME CARE INSTRUCTIONS   Take all medicine as directed by your health care provider. Only take over-the-counter or prescription medicines for pain, discomfort, or fever as directed by your health care provider.  If you were given a splint to keep your wrist from bending, wear it as directed. It is important to wear the splint at  night. Wear the splint for as long as you have pain or numbness in your hand, arm, or wrist. This may take 1 to 2 months.  Rest your wrist from any activity that may be causing your pain. If your symptoms are work-related, you may need to talk to your employer about changing to a job that does not require using your wrist.  Put ice on your wrist after long periods of wrist activity.  Put ice in a plastic bag.  Place a towel between your skin and the bag.  Leave the ice on for 15-20 minutes, 03-04 times a day.  Keep all follow-up visits as directed by your health care provider. This includes any orthopedic referrals, physical therapy, and rehabilitation. Any delay in getting necessary care could result in a delay or failure of your condition to heal. SEEK IMMEDIATE MEDICAL CARE IF:   You have new, unexplained symptoms.  Your symptoms get worse and are not helped or controlled with medicines. MAKE SURE YOU:   Understand these instructions.  Will watch your condition.  Will get help right away if you are not doing well or get worse. Document Released: 07/15/2000 Document Revised: 12/02/2013 Document Reviewed: 06/03/2011 [facility] Patient Information 2015 [place], [place]. This information is not intended to replace advice given to you by your health care provider. Make sure you discuss any questions you have with your health care provider. Carpal Tunnel Syndrome Carpal tunnel syndrome is a disorder of the nervous system in the wrist that causes pain, hand weakness, and/or loss of feeling. Carpal tunnel syndrome is caused by the compression,  stretching, or irritation of the median nerve at the wrist joint. Athletes who experience carpal tunnel syndrome may notice a decrease in their performance to the condition, especially for sports that require strong hand or wrist action.  SYMPTOMS   Tingling, numbness, or burning pain in the hand or fingers.  Inability to sleep due to pain in the  hand.  Sharp pains that shoot from the wrist up the arm or to the fingers, especially at night.  Morning stiffness or cramping of the hand.  Thumb weakness, resulting in difficulty holding objects or making a fist.  Shiny, dry skin on the hand.  Reduced performance in any sport requiring a strong grip. CAUSES   Median nerve damage at the wrist is caused by pressure due to swelling, inflammation, or scarred tissue.  Sources of pressure include:  Repetitive gripping or squeezing that causes inflammation of the tendon sheaths.  Scarring or shortening of the ligament that covers the median nerve.  Traumatic injury to the wrist or forearm such as fracture, sprain, or dislocation.  Prolonged hyperextension (wrist bent backward) or hyperflexion (wrist bent downward) of the wrist. RISK INCREASES WITH:  Diabetes mellitus.  Menopause or amenorrhea.  Rheumatoid arthritis.  Raynaud disease.  Pregnancy.  Gout.  Kidney disease.  Ganglion cyst.  Repetitive hand or wrist action.  Hypothyroidism (underactive thyroid gland).  Repetitive jolting or shaking of the hands or wrist.  Prolonged forceful weight-bearing on the hands. PREVENTION  Bracing the hand and wrist straight during activities that involve repetitive grasping.  For activities that require prolonged extension of the wrist (bending towards the top of the forearm) periodically change the position of your wrists.  Learn and use proper technique in activities that result in the wrist position in neutral to slight extension.  Avoid bending the wrist into full extension or flexion (up or down).  Keep the wrist in a straight (neutral) position. To keep the wrist in this position, wear a splint.  Avoid repetitive hand and wrist motions.  When possible avoid prolonged grasping of items (steering wheel of a car, a pen, a vacuum cleaner, or a rake).  Loosen your grip for activities that require prolonged grasping of  items.  Place keyboards and writing surfaces at the correct height as to decrease strain on the wrist and hand.  Alternate work tasks to avoid prolonged wrist flexion.  Avoid pinching activities (needlework and writing) as they may irritate your carpal tunnel syndrome.  If these activities are necessary, complete them for shorter periods of time.  When writing, use a felt tip or rollerball pen and/or build up the grip on a pen to decrease the forces required for writing. PROGNOSIS  Carpal tunnel syndrome is usually curable with appropriate conservative treatment and sometimes resolves spontaneously. For some cases, surgery is necessary, especially if muscle wasting or nerve changes have developed.  RELATED COMPLICATIONS   Permanent numbness and a weak thumb or fingers in the affected hand.  Permanent paralysis of a portion of the hand and fingers. TREATMENT  Treatment initially consists of stopping activities that aggravate the symptoms as well as medication and ice to reduce inflammation. A wrist splint is often recommended for wear during activities of repetitive motion as well as at night. It is also important to learn and use proper technique when performing activities that typically cause pain. On occasion, a corticosteroid injection may be given. If symptoms persist despite conservative treatment, surgery may be an option. Surgical techniques free the pinched or compressed  nerve. Carpal tunnel surgery is usually performed on an outpatient basis, meaning you go home the same day as surgery. These procedures provide almost complete relief of all symptoms in 95% of patients. Expect at least 2 weeks for healing after surgery. For cases that are the result of repeated jolting or shaking of the hand or wrist or prolonged hyperextension, surgery is not usually recommended because stretching of the median nerve, not compression, is usually the cause of carpal tunnel syndrome in these  cases. MEDICATION   If pain medication is necessary, nonsteroidal anti-inflammatory medications, such as aspirin and ibuprofen, or other minor pain relievers, such as acetaminophen, are often recommended.  Do not take pain medication for 7 days before surgery.  Prescription pain relievers are usually only prescribed after surgery. Use only as directed and only as much as you need.  Corticosteroid injections may be given to reduce inflammation. However, they are not always recommended.  Vitamin B6 (pyridoxine) may reduce symptoms; use only if prescribed for your disorder. SEEK MEDICAL CARE IF:   Symptoms get worse or do not improve in 2 weeks despite treatment.  You also have a current or recent history of neck or shoulder injury that has resulted in pain or tingling elsewhere in your arm. Document Released: 07/18/2005 Document Revised: 12/02/2013 Document Reviewed: 10/30/2008 [facility] Patient Information 2015 [place], [place]. This information is not intended to replace advice given to you by your health care provider. Make sure you discuss any questions you have with your health care provider.  Keeping You Healthy  Get These Tests  Blood Pressure- Have your blood pressure checked by your healthcare provider at least once a year.  Normal blood pressure is 120/80.  Weight- Have your body mass index (BMI) calculated to screen for obesity.  BMI is a measure of body fat based on height and weight.  You can calculate your own BMI at [employer]  Cholesterol- Have your cholesterol checked every year.  Diabetes- Have your blood sugar checked every year if you have high blood pressure, high cholesterol, a family history of diabetes or if you are overweight.  Pap Smear- Have a pap smear every 1 to 3 years if you have been sexually active.  If you are older than 65 and recent pap smears have been normal you may not need additional pap smears.  In addition, if you have had a  hysterectomy  For benign disease additional pap smears are not necessary.  Mammogram-Yearly mammograms are essential for early detection of breast cancer  Screening for Colon Cancer- Colonoscopy starting at age 39. Screening may begin sooner depending on your family history and other health conditions.  Follow up colonoscopy as directed by your Gastroenterologist.  Screening for Osteoporosis- Screening begins at age 10 with bone density scanning, sooner if you are at higher risk for developing Osteoporosis.  Get these medicines  Calcium with Vitamin D- Your body requires 1200-1500 mg of Calcium a day and [phone] IU of Vitamin D a day.  You can only absorb 500 mg of Calcium at a time therefore Calcium must be taken in 2 or 3 separate doses throughout the day.  Hormones- Hormone therapy has been associated with increased risk for certain cancers and heart disease.  Talk to your healthcare provider about if you need relief from menopausal symptoms.  Aspirin- Ask your healthcare provider about taking Aspirin to prevent Heart Disease and Stroke.  Get these Immuniztions  Flu shot- Every fall  Pneumonia shot- Once after the age  of 33; if you are younger ask your healthcare provider if you need a pneumonia shot.  Tetanus- Every ten years.  Zostavax- Once after the age of 68 to prevent shingles.  Take these steps  Don't smoke- Your healthcare provider can help you quit. For tips on how to quit, ask your healthcare provider or go to www.smokefree.gov or call 1-800 QUIT-NOW.  Be physically active- Exercise 5 days a week for a minimum of 30 minutes.  If you are not already physically active, start slow and gradually work up to 30 minutes of moderate physical activity.  Try walking, dancing, bike riding, swimming, etc.  Eat a healthy diet- Eat a variety of healthy foods such as fruits, vegetables, whole grains, low fat milk, low fat cheeses, yogurt, lean meats, chicken, fish, eggs, dried beans,  tofu, etc.  For more information go to www.thenutritionsource.org  Dental visit- Brush and floss teeth twice daily; visit your dentist twice a year.  Eye exam- Visit your Optometrist or Ophthalmologist yearly.  Drink alcohol in moderation- Limit alcohol intake to one drink or less a day.  Never drink and drive.  Depression- Your emotional health is as important as your physical health.  If you're feeling down or losing interest in things you normally enjoy, please talk to your healthcare provider.  Seat Belts- can save your life; always wear one  Smoke/Carbon Monoxide detectors- These detectors need to be installed on the appropriate level of your home.  Replace batteries at least once a year.  Violence- If anyone is threatening or hurting you, please tell your healthcare provider.  Living Will/ Health care power of attorney- Discuss with your healthcare provider and family.  Diabetes Mellitus and Food It is important for you to manage your blood sugar (glucose) level. Your blood glucose level can be greatly affected by what you eat. Eating healthier foods in the appropriate amounts throughout the day at about the same time each day will help you control your blood glucose level. It can also help slow or prevent worsening of your diabetes mellitus. Healthy eating may even help you improve the level of your blood pressure and reach or maintain a healthy weight.  HOW CAN FOOD AFFECT ME? Carbohydrates Carbohydrates affect your blood glucose level more than any other type of food. Your dietitian will help you determine how many carbohydrates to eat at each meal and teach you how to count carbohydrates. Counting carbohydrates is important to keep your blood glucose at a healthy level, especially if you are using insulin or taking certain medicines for diabetes mellitus. Alcohol Alcohol can cause sudden decreases in blood glucose (hypoglycemia), especially if you use insulin or take certain  medicines for diabetes mellitus. Hypoglycemia can be a life-threatening condition. Symptoms of hypoglycemia (sleepiness, dizziness, and disorientation) are similar to symptoms of having too much alcohol.  If your health care provider has given you approval to drink alcohol, do so in moderation and use the following guidelines:  Women should not have more than one drink per day, and men should not have more than two drinks per day. One drink is equal to:  12 oz of beer.  5 oz of wine.  1 oz of hard liquor.  Do not drink on an empty stomach.  Keep yourself hydrated. Have water, diet soda, or unsweetened iced tea.  Regular soda, juice, and other mixers might contain a lot of carbohydrates and should be counted. WHAT FOODS ARE NOT RECOMMENDED? As you make food choices, it is  important to remember that all foods are not the same. Some foods have fewer nutrients per serving than other foods, even though they might have the same number of calories or carbohydrates. It is difficult to get your body what it needs when you eat foods with fewer nutrients. Examples of foods that you should avoid that are high in calories and carbohydrates but low in nutrients include:  Trans fats (most processed foods list trans fats on the Nutrition Facts label).  Regular soda.  Juice.  Candy.  Sweets, such as cake, pie, doughnuts, and cookies.  Fried foods. WHAT FOODS CAN I EAT? Have nutrient-rich foods, which will nourish your body and keep you healthy. The food you should eat also will depend on several factors, including:  The calories you need.  The medicines you take.  Your weight.  Your blood glucose level.  Your blood pressure level.  Your cholesterol level. You also should eat a variety of foods, including:  Protein, such as meat, poultry, fish, tofu, nuts, and seeds (lean animal proteins are best).  Fruits.  Vegetables.  Dairy products, such as milk, cheese, and yogurt (low fat is  best).  Breads, grains, pasta, cereal, rice, and beans.  Fats such as olive oil, trans fat-free margarine, canola oil, avocado, and olives. DOES EVERYONE WITH DIABETES MELLITUS HAVE THE SAME MEAL PLAN? Because every person with diabetes mellitus is different, there is not one meal plan that works for everyone. It is very important that you meet with a dietitian who will help you create a meal plan that is just right for you. Document Released: 04/14/2005 Document Revised: 07/23/2013 Document Reviewed: 06/14/2013 [facility] Patient Information 2015 [place], [place]. This information is not intended to replace advice given to you by your health care provider. Make sure you discuss any questions you have with your health care provider. Diabetes, Eating Away From Home Sometimes, you might eat in a restaurant or have meals that are prepared by someone else. You can enjoy eating out. However, the portions in restaurants may be much larger than needed. Listed below are some ideas to help you choose foods that will keep your blood glucose (sugar) in better control.  TIPS FOR EATING OUT  Know your meal plan and how many carbohydrate servings you should have at each meal. You may wish to carry a copy of your meal plan in your purse or wallet. Learn the foods included in each food group.  Make a list of restaurants near you that offer healthy choices. Take a copy of the carry-out menus to see what they offer. Then, you can plan what you will order ahead of time.  Become familiar with serving sizes by practicing them at home using measuring cups and spoons. Once you learn to recognize portion sizes, you will be able to correctly estimate the amount of total carbohydrate you are allowed to eat at the restaurant. Ask for a takeout box if the portion is more than you should have. When your food comes, leave the amount you should have on the plate, and put the rest in the takeout box before you start eating.  Plan  ahead if your mealtime will be different from usual. Check with your caregiver to find out how to time meals and medicine if you are taking insulin.  Avoid high-fat foods, such as fried foods, cream sauces, high-fat salad dressings, or any added butter or margarine.  Do not be afraid to ask questions. Ask your server about the portion  size, cooking methods, ingredients and if items can be substituted. Restaurants do not list all available items on the menu. You can ask for your main entree to be prepared using skim milk, oil instead of butter or margarine, and without gravy or sauces. Ask your waiter or waitress to serve salad dressings, gravy, sauces, margarine, and sour cream on the side. You can then add the amount your meal plan suggests.  Add more vegetables whenever possible.  Avoid items that are labeled "jumbo," "giant," "deluxe," or "supersized."  You may want to split an entre with someone and order an extra side salad.  Watch for hidden calories in foods like croutons, bacon, or cheese.  Ask your server to take away the bread basket or chips from your table.  Order a dinner salad as an appetizer. You can eat most foods served in a restaurant. Some foods are better choices than others. Breads and Starches  Recommended: All kinds of bread (wheat, rye, white, oatmeal, [place], [place], raisin), hard or soft dinner rolls, frankfurter or hamburger buns, small bagels, small corn or whole-wheat flour tortillas.  Avoid: Frosted or glazed breads, butter rolls, egg or cheese breads, croissants, sweet rolls, pastries, coffee cake, glazed or frosted doughnuts, muffins. Crackers  Recommended: Animal crackers, graham, rye, saltine, oyster, and matzoth crackers. Bread sticks, melba toast, rusks, pretzels, popcorn (without fat), zwieback toast.  Avoid: High-fat snack crackers or chips. Buttered popcorn. Cereals  Recommended: Hot and cold cereals. Whole grains such as oatmeal or shredded  wheat are good choices.  Avoid: Sugar-coated or granola type cereals. Potatoes/Pasta/Rice/Beans  Recommended: Order baked, boiled, or mashed potatoes, rice or noodles without added fat, whole beans. Order gravies, butter, margarine, or sauces on the side so you can control the amount you add.  Avoid: Hash browns or fried potatoes. Potatoes, pasta, or rice prepared with cream or cheese sauce. Potato or pasta salads prepared with large amounts of dressing. Fried beans or fried rice. Vegetables  Recommended: Order steamed, baked, boiled, or stewed vegetables without sauces or extra fat. Ask that sauce be served on the side. If vegetables are not listed on the menu, ask what is available.  Avoid: Vegetables prepared with cream, butter, or cheese sauce. Fried vegetables. Salad Bars  Recommended: Many of the vegetables at a salad bar are considered "free." Use lemon juice, vinegar, or low-calorie salad dressing (fewer than 20 calories per serving) as "free" dressings for your salad. Look for salad bar ingredients that have no added fat or sugar such as tomatoes, lettuce, cucumbers, broccoli, carrots, onions, and mushrooms.  Avoid: Prepared salads with large amounts of dressing, such as coleslaw, caesar salad, macaroni salad, bean salad, or carrot salad. Fruit  Recommended: Eat fresh fruit or fresh fruit salad without added dressing. A salad bar often offers fresh fruit choices, but canned fruit at a restaurant is usually packed in sugar or syrup.  Avoid: Sweetened canned or frozen fruits, plain or sweetened fruit juice. Fruit salads with dressing, sour cream, or sugar added to them. Meat and Meat Substitutes  Recommended: Order broiled, baked, roasted, or grilled meat, poultry, or fish. Trim off all visible fat. Do not eat the skin of poultry. The size stated on the menu is the raw weight. Meat shrinks by  in cooking (for example, 4 oz raw equals 3 oz cooked meat).  Avoid: Deep-fat fried meat,  poultry, or fish. Breaded meats. Eggs  Recommended: Order soft, hard-cooked, poached, or scrambled eggs. Omelets may be okay, depending on what  ingredients are added. Egg substitutes are also a good choice.  Avoid: Fried eggs, eggs prepared with cream or cheese sauce. Milk  Recommended: Order low-fat or fat-free milk according to your meal plan. Plain, nonfat yogurt or flavored yogurt with no sugar added may be used as a substitute for milk. Soy milk may also be used.  Avoid: Milk shakes or sweetened milk beverages. Soups and Combination Foods  Recommended: Clear broth or consomm are "free" foods and may be used as an appetizer. Broth-based soups with fat removed count as a starch serving and are preferred over cream soups. Soups made with beans or split peas may be eaten but count as a starch.  Avoid: Fatty soups, soup made with cream, cheese soup. Combination foods prepared with excessive amounts of fat or with cream or cheese sauces. Desserts and Sweets  Recommended: Ask for fresh fruit. Sponge or angel food cake without icing, ice milk, no sugar added ice cream, sherbet, or frozen yogurt may fit into your meal plan occasionally.  Avoid: Pastries, puddings, pies, cakes with icing, custard, gelatin desserts. Fats and Oils  Recommended: Choose healthy fats such as olive oil, canola oil, or tub margarine, reduced fat or fat-free sour cream, cream cheese, avocado, or nuts.  Avoid: Any fats in excess of your allowed portion. Deep-fried foods or any food with a large amount of fat. Note: Ask for all fats to be served on the side, and limit your portion sizes according to your meal plan. Document Released: 07/18/2005 Document Revised: 10/10/2011 Document Reviewed: 10/15/2013 [facility] Patient Information 2015 [place], [place]. This information is not intended to replace advice given to you by your health care provider. Make sure you discuss any questions you have with your health care  provider.

## 2014-07-08 LAB — IFOBT (OCCULT BLOOD): IFOBT: NEGATIVE

## 2014-07-29 ENCOUNTER — Other Ambulatory Visit: Payer: Self-pay | Admitting: Family Medicine

## 2014-07-29 ENCOUNTER — Ambulatory Visit: Payer: 59

## 2014-07-29 ENCOUNTER — Ambulatory Visit
Admission: RE | Admit: 2014-07-29 | Discharge: 2014-07-29 | Disposition: A | Discharge status: Home / Self Care | Payer: 59 | Source: Ambulatory Visit | Attending: Family Medicine | Admitting: Family Medicine

## 2014-07-29 DIAGNOSIS — Z1231 Encounter for screening mammogram for malignant neoplasm of breast: Secondary | ICD-10-CM

## 2014-07-31 ENCOUNTER — Other Ambulatory Visit: Payer: Self-pay | Admitting: Family Medicine

## 2014-07-31 DIAGNOSIS — R928 Other abnormal and inconclusive findings on diagnostic imaging of breast: Secondary | ICD-10-CM

## 2014-08-13 ENCOUNTER — Other Ambulatory Visit: Payer: Self-pay | Admitting: Family Medicine

## 2014-08-13 ENCOUNTER — Ambulatory Visit
Admission: RE | Admit: 2014-08-13 | Discharge: 2014-08-13 | Disposition: A | Discharge status: Home / Self Care | Payer: 59 | Source: Ambulatory Visit | Attending: Family Medicine | Admitting: Family Medicine

## 2014-08-13 DIAGNOSIS — R928 Other abnormal and inconclusive findings on diagnostic imaging of breast: Secondary | ICD-10-CM

## 2015-03-23 ENCOUNTER — Ambulatory Visit (INDEPENDENT_AMBULATORY_CARE_PROVIDER_SITE_OTHER): Payer: 59

## 2015-03-23 ENCOUNTER — Ambulatory Visit (INDEPENDENT_AMBULATORY_CARE_PROVIDER_SITE_OTHER): Payer: 59 | Admitting: Internal Medicine

## 2015-03-23 VITALS — BP 138/78 | HR 86 | Temp 98.1°F | Resp 16 | Ht 60.0 in | Wt 153.6 lb

## 2015-03-23 DIAGNOSIS — R5383 Other fatigue: Secondary | ICD-10-CM

## 2015-03-23 DIAGNOSIS — R002 Palpitations: Secondary | ICD-10-CM

## 2015-03-23 DIAGNOSIS — R700 Elevated erythrocyte sedimentation rate: Secondary | ICD-10-CM | POA: Diagnosis not present

## 2015-03-23 DIAGNOSIS — R9431 Abnormal electrocardiogram [ECG] [EKG]: Secondary | ICD-10-CM | POA: Diagnosis not present

## 2015-03-23 DIAGNOSIS — R059 Cough, unspecified: Secondary | ICD-10-CM

## 2015-03-23 DIAGNOSIS — R1013 Epigastric pain: Secondary | ICD-10-CM

## 2015-03-23 DIAGNOSIS — Z5329 Procedure and treatment not carried out because of patient's decision for other reasons: Secondary | ICD-10-CM

## 2015-03-23 DIAGNOSIS — R05 Cough: Secondary | ICD-10-CM | POA: Diagnosis not present

## 2015-03-23 DIAGNOSIS — Z5320 Procedure and treatment not carried out because of patient's decision for unspecified reasons: Secondary | ICD-10-CM

## 2015-03-23 LAB — COMPREHENSIVE METABOLIC PANEL
ALBUMIN: 4.4 g/dL (ref 3.6–5.1)
ALT: 21 U/L (ref 6–29)
AST: 21 U/L (ref 10–35)
Alkaline Phosphatase: 69 U/L (ref 33–130)
BUN: 19 mg/dL (ref 7–25)
CO2: 26 mmol/L (ref 20–31)
Calcium: 9.4 mg/dL (ref 8.6–10.4)
Chloride: 104 mmol/L (ref 98–110)
Creat: 0.7 mg/dL (ref 0.50–1.05)
GLUCOSE: 117 mg/dL — AB (ref 65–99)
POTASSIUM: 5.1 mmol/L (ref 3.5–5.3)
Sodium: 142 mmol/L (ref 135–146)
Total Bilirubin: 0.5 mg/dL (ref 0.2–1.2)
Total Protein: 7.3 g/dL (ref 6.1–8.1)

## 2015-03-23 LAB — POCT CBC
Granulocyte percent: 68.6 %G (ref 37–80)
HCT, POC: 44.1 % (ref 37.7–47.9)
HEMOGLOBIN: 14.5 g/dL (ref 12.2–16.2)
Lymph, poc: 1.8 (ref 0.6–3.4)
MCH: 28.4 pg (ref 27–31.2)
MCHC: 32.9 g/dL (ref 31.8–35.4)
MCV: 86.4 fL (ref 80–97)
MID (CBC): 0.5 (ref 0–0.9)
MPV: 6 fL (ref 0–99.8)
POC GRANULOCYTE: 4.9 (ref 2–6.9)
POC LYMPH PERCENT: 24.8 %L (ref 10–50)
POC MID %: 6.6 % (ref 0–12)
Platelet Count, POC: 317 10*3/uL (ref 142–424)
RBC: 5.1 M/uL (ref 4.04–5.48)
RDW, POC: 13.5 %
WBC: 7.2 10*3/uL (ref 4.6–10.2)

## 2015-03-23 LAB — TSH: TSH: 1.103 u[IU]/mL (ref 0.350–4.500)

## 2015-03-23 LAB — POCT SEDIMENTATION RATE: POCT SED RATE: 60 mm/hr — AB (ref 0–22)

## 2015-03-23 LAB — T4, FREE: FREE T4: 1.1 ng/dL (ref 0.80–1.80)

## 2015-03-23 NOTE — Patient Instructions (Signed)
Zyrtec 10 daily for 2 weeks Zantac 150(ranitidine) twice a day for 2 weeks

## 2015-03-23 NOTE — Progress Notes (Signed)
Subjective:    Patient ID: [name], female    DOB: [birth year], 54 y.o.   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 Negative for fever, chills, appetite change and unexpected weight change.  HENT: Positive for rhinorrhea. Negative for trouble swallowing.   Respiratory: Positive for cough.        [place]/nonproduc  Gastrointestinal: Negative for diarrhea and constipation.       Quaesy//occas indiges-reflux  Genitourinary: Negative for difficulty urinating.  Musculoskeletal: Positive for myalgias. Negative for back pain, joint swelling, gait problem, neck pain and neck stiffness.  Neurological: Positive for headaches.       Bitemp/freq--no assoc sxt  Psychiatric/Behavioral: Positive for sleep disturbance. The patient is not nervous/anxious.        No snoring or gasping for air at night       Objective:   Physical Exam BP 138/78 mmHg  Pulse 86  Temp(Src) 98.1 F (36.7 C) (Oral)  Resp 16  Ht 5' (1.524 m)  Wt 153 lb 9.6 oz (69.673 kg)  BMI 30.00 kg/m2  SpO2 99%  LMP 10/14/2014 Alert in no acute distress HEENT ENT clear except mild rhinorrhea No thyromegaly or nodules Chest clear to auscultation Heart regular without murmur click Abdomen soft nontenderwithout organomegaly or masses Spine straight with negative straight leg raise Skin clear Hair is not course Extremities without edema/full peripheral pulses Cranial nerves II through XII intact Deep tendon reflexes symmetrical Gait normal She does cough intermittently throughout the exam  Results for orders placed or performed in visit on 03/23/15  POCT CBC   Result Value Ref Range   WBC 7.2 4.6 - 10.2 K/uL   Lymph, poc 1.8 0.6 - 3.4   POC LYMPH PERCENT 24.8 10 - 50 %L   MID (cbc) 0.5 0 - 0.9   POC MID % 6.6 0 - 12 %M   POC Granulocyte 4.9 2 - 6.9   Granulocyte percent 68.6 37 - 80 %G   RBC 5.10 4.04 - 5.48 M/uL   Hemoglobin 14.5 12.2 - 16.2 g/dL   HCT, POC 44.1 37.7 - 47.9 %   MCV 86.4 80 - 97 fL   MCH, POC 28.4 27 - 31.2 pg   MCHC 32.9 31.8 - 35.4 g/dL   RDW, POC 13.5 %   Platelet Count, POC 317 142 - 424 K/uL   MPV 6.0 0 - 99.8 fL     [facility] reading (PRIMARY) by  Dr.Stevi Hollinshead=NAD--- radiology review suggest mild bibasilar atelectasis low lung volumes. EKG=low volt--borderline pr(11.5)  Assessment & Plan:  Other fatigue -w/ daytime somnolence  Cough -   Palpitations - ? Short PR syndrome  Rhinorrhea  occas dyspepsia  Unclear etiology. Will call with lab results and plan.   Addendum lab results 03/24/2015 with everything normal septae very slightly decreased PR interval and an elevated sedimentation rate with by basilar atelectasis, her symptoms would suggest cardiology evaluation the next step--then pulmonary. He was also reported that she has not had a colonoscopy or endoscopy and should have her routine care for age with colonoscopy, consideration of endoscopy because of her dyspepsia  Results for orders  placed or performed in visit on 03/23/15  Comprehensive metabolic panel  Result Value Ref Range   Sodium 142 135 - 146 mmol/L   Potassium 5.1 3.5 - 5.3 mmol/L   Chloride 104 98 - 110 mmol/L   CO2 26 20 - 31 mmol/L   Glucose, Bld 117 (H) 65 - 99 mg/dL   BUN 19 7 - 25 mg/dL   Creat 0.70 0.50 - 1.05 mg/dL   Total Bilirubin 0.5 0.2 - 1.2 mg/dL   Alkaline Phosphatase 69 33 - 130 U/L   AST 21 10 - 35 U/L   ALT 21 6 - 29 U/L   Total Protein 7.3 6.1 - 8.1 g/dL   Albumin 4.4 3.6 - 5.1 g/dL   Calcium 9.4 8.6 - 10.4 mg/dL  TSH  Result Value Ref Range   TSH 1.103 0.350 - 4.500 uIU/mL  T4, free  Result Value Ref Range    Free T4 1.10 0.80 - 1.80 ng/dL  POCT CBC  Result Value Ref Range   WBC 7.2 4.6 - 10.2 K/uL   Lymph, poc 1.8 0.6 - 3.4   POC LYMPH PERCENT 24.8 10 - 50 %L   MID (cbc) 0.5 0 - 0.9   POC MID % 6.6 0 - 12 %M   POC Granulocyte 4.9 2 - 6.9   Granulocyte percent 68.6 37 - 80 %G   RBC 5.10 4.04 - 5.48 M/uL   Hemoglobin 14.5 12.2 - 16.2 g/dL   HCT, POC 44.1 37.7 - 47.9 %   MCV 86.4 80 - 97 fL   MCH, POC 28.4 27 - 31.2 pg   MCHC 32.9 31.8 - 35.4 g/dL   RDW, POC 13.5 %   Platelet Count, POC 317 142 - 424 K/uL   MPV 6.0 0 - 99.8 fL  POCT SEDIMENTATION RATE  Result Value Ref Range   POCT SED RATE 60 (A) 0 - 22 mm/hr   she will start Zantac 150 twice a day in Zyrtec 10 daily

## 2015-04-01 ENCOUNTER — Other Ambulatory Visit: Payer: Self-pay | Admitting: Family Medicine

## 2015-04-01 ENCOUNTER — Encounter: Payer: Self-pay | Admitting: Gastroenterology

## 2015-04-01 DIAGNOSIS — N6489 Other specified disorders of breast: Secondary | ICD-10-CM

## 2015-04-09 ENCOUNTER — Ambulatory Visit
Admission: RE | Admit: 2015-04-09 | Discharge: 2015-04-09 | Disposition: A | Discharge status: Home / Self Care | Payer: 59 | Source: Ambulatory Visit | Attending: Family Medicine | Admitting: Family Medicine

## 2015-04-09 DIAGNOSIS — N6489 Other specified disorders of breast: Secondary | ICD-10-CM

## 2015-04-27 ENCOUNTER — Ambulatory Visit: Payer: 59 | Admitting: Cardiovascular Disease

## 2015-05-03 NOTE — Progress Notes (Signed)
Cardiology Office Note   Date:  05/04/2015   ID:  Emily [name], DOB Aug 10, 1960, MRN [id number]  PCP:  Delman Cheadle, MD  Cardiologist:   Sharol Harness, MD   Chief Complaint  Patient presents with  . New Evaluation    pt c/o heart racing       History of Present Illness: Emily [name] is a 54 y.o. female with hyperlipidemia and family history of premature CAD who presents for an evaluation of palpitations.  These symptoms have been ongoing for several months. She reports chest congestion and heaviness that occurs almost daily. She cannot associated with any particular activity. It occurs both at rest and with exertion. The symptoms last up to 2 days at a time and INR of 3-4 out of 10 in severity. There is no radiation occurs substernally. There is associated shortness of breath, nausea, and diaphoresis, though she is unable to determine if it's worse when she's having chest pressure. She notes that she is also perimenopausal and is unsure whether the symptoms may be related to this.  [name] denies lower extremity edema, orthopnea, or PND. She does note palpitations and that her resting heart rate has been over 100 at times. This is different, as in the past her heart rate was in the 70s to 80's. This occurs almost daily and is associated with chest pressure and lightheadedness. She denies syncope. [name] also notes extreme fatigue and feeling as though she needs to lay down at the end of the day. These symptoms have improved somewhat over the last month, but still persist.  [name] was evaluated by her PCP, Dr. Laney Pastor, on 03/23/15. At that appointment ECG showed sinus rhythm with borderline short PR interval.  Basic metabolic panel, CBC, and thyroid function were within normal limits. She was noted to have a elevated sedimentation rate at 60.  Dr. Laney Pastor recommended that she take allergy and stomach medicine, which helped somewhat, but did not alleviate the symptoms  completely.  Ms. Schwake mother died of a myocardial infarction at age 42. Her sister had peripartum cardiopathy at age 57.  She does not get any formal exercise but she cares for her 50 and 68 month old grandchildren all day.  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  She cooks most of her meals but notes that her diet could be better.  She is trying to increase her fiber and vegetable intake.  She drinks water, unsweetened tea and one cup of coffee daily.    No past medical history on file.  Past Surgical History  Procedure Laterality Date  . Tonsillectomy      when she was 3     Current Outpatient Prescriptions  Medication Sig Dispense Refill  . acetaminophen (TYLENOL) 500 MG tablet Take 500 mg by mouth every 6 (six) hours as needed for moderate pain.    Marland Kitchen aspirin 81 MG tablet Take 81 mg by mouth daily.    . cholecalciferol (VITAMIN D) 1000 UNITS tablet Take 1,000 Units by mouth daily.    Marland Kitchen ibuprofen (ADVIL,MOTRIN) 200 MG tablet Take 200 mg by mouth every 6 (six) hours as needed for mild pain.    . Multiple Vitamins-Minerals (MULTIVITAMIN WITH MINERALS) tablet Take 1 tablet by mouth daily.    Marland Kitchen thiamine (VITAMIN B-1) 100 MG tablet Take 100 mg by mouth daily.     No current facility-administered medications for this visit.    Allergies:   Tetanus toxoids  Social History:  The patient  reports that she has never smoked. She has never used smokeless tobacco. She reports that she does not drink alcohol or use illicit drugs.   Family History:  [name] family history includes COPD in her father and mother; Cancer in her father and paternal grandmother; Diabetes in her father and sister; [name]' disease in her sister; Heart disease in her brother, father, mother, and sister; Hyperlipidemia in her brother, father, and sister; Vision loss in her father.    ROS:  Please see the history of present illness.   Otherwise, review of systems are positive for none.   All other  systems are reviewed and negative.    PHYSICAL EXAM: VS:  BP 126/74 mmHg  Pulse 91  Ht 4' 11.5" (1.511 m)  Wt 70.353 kg (155 lb 1.6 oz)  BMI 30.81 kg/m2 , BMI Body mass index is 30.81 kg/(m^2). GENERAL:  Well appearing HEENT:  Pupils equal round and reactive, fundi not visualized, oral mucosa unremarkable NECK:  No jugular venous distention, waveform within normal limits, carotid upstroke brisk and symmetric, no bruits, no thyromegaly LYMPHATICS:  No cervical adenopathy LUNGS:  Clear to auscultation bilaterally HEART:  RRR.  PMI not displaced or sustained,S1 and S2 within normal limits, no S3, no S4, no clicks, no rubs, no murmurs ABD:  Flat, positive bowel sounds normal in frequency in pitch, no bruits, no rebound, no guarding, no midline pulsatile mass, no hepatomegaly, no splenomegaly EXT:  2 plus pulses throughout, no edema, no cyanosis no clubbing SKIN:  No rashes no nodules NEURO:  Cranial nerves II through XII grossly intact, motor grossly intact throughout PSYCH:  Cognitively intact, oriented to person place and time    EKG:  EKG is ordered today. The ekg ordered today demonstrates sinus rhythm at 91 bpm.   Recent Labs: 07/04/2014: Platelets 260 03/23/2015: ALT 21; BUN 19; Creat 0.70; Hemoglobin 14.5; Potassium 5.1; Sodium 142; TSH 1.103    Lipid Panel    Component Value Date/Time   CHOL 210* 07/04/2014 1022   TRIG 97 07/04/2014 1022   HDL 57 07/04/2014 1022   CHOLHDL 3.7 07/04/2014 1022   VLDL 19 07/04/2014 1022   LDLCALC 134* 07/04/2014 1022   LDLDIRECT 129* 07/27/2012 0809      Wt Readings from Last 3 Encounters:  05/04/15 70.353 kg (155 lb 1.6 oz)  03/23/15 69.673 kg (153 lb 9.6 oz)  07/04/14 69.4 kg (153 lb)      Other studies Reviewed: Additional studies/ records that were reviewed today include: . Review of the above records demonstrates:  Please see elsewhere in the note.     ASSESSMENT AND PLAN:  # Atypical chest pain: Ms. was chest pain is  atypical in that it lasts for days at a time and is not associated with exertion. However she does have a family history of cardiac disease. Also, given her known to present atypically. We will obtain a treadmill stress test to evaluate for ischemia. She is to continue aspirin 81 mg daily for now. Her ASCVD 10 year risk is 2%.  Therefore, we do not recommend statin therapy at this time.  # Palpitations: Ms. Carlucci reports palpitations and a fast resting heart rate.  Thyroid function, CBC and BMP were unremarkable.  We will obtain a 48 hour Holter to determine the underlying rhythm associated with these symptoms.      Current medicines are reviewed at length with the patient today.  The patient does not have concerns  regarding medicines.  The following changes have been made:  no change  Labs/ tests ordered today include:   Orders Placed This Encounter  Procedures  . Exercise Tolerance Test  . Holter monitor - 48 hour  . EKG 12-Lead     Disposition:   FU with Netanya Yazdani C. Oval Linsey, MD in 1 year.    Signed, Sharol Harness, MD  05/04/2015 8:35 AM    [facility]

## 2015-05-04 ENCOUNTER — Encounter: Payer: Self-pay | Admitting: Cardiovascular Disease

## 2015-05-04 ENCOUNTER — Ambulatory Visit (INDEPENDENT_AMBULATORY_CARE_PROVIDER_SITE_OTHER): Payer: 59 | Admitting: Cardiovascular Disease

## 2015-05-04 ENCOUNTER — Encounter (INDEPENDENT_AMBULATORY_CARE_PROVIDER_SITE_OTHER): Payer: 59

## 2015-05-04 VITALS — BP 126/74 | HR 91 | Ht 59.5 in | Wt 155.1 lb

## 2015-05-04 DIAGNOSIS — R002 Palpitations: Secondary | ICD-10-CM

## 2015-05-04 DIAGNOSIS — R072 Precordial pain: Secondary | ICD-10-CM

## 2015-05-04 NOTE — Patient Instructions (Signed)
Your physician has recommended that you wear a holter monitor. Holter monitors are medical devices that record the heart's electrical activity. Doctors most often use these monitors to diagnose arrhythmias. Arrhythmias are problems with the speed or rhythm of the heartbeat. The monitor is a small, portable device. You can wear one while you do your normal daily activities. This is usually used to diagnose what is causing palpitations/syncope (passing out).  Your physician has requested that you have an exercise tolerance test. For further information please visit [email or web address]. Please also follow instruction sheet, as given.  Dr Oval Linsey recommends that you schedule a follow-up appointment in 1 year. You will receive a reminder letter in the mail two months in advance. If you don't receive a letter, please call our office to schedule the follow-up appointment.

## 2015-05-08 ENCOUNTER — Telehealth: Payer: Self-pay | Admitting: Cardiovascular Disease

## 2015-05-08 NOTE — Telephone Encounter (Signed)
Pt would like to know if her monitor results are ready? °

## 2015-05-08 NOTE — Telephone Encounter (Signed)
Spoke with pt, aware monitor results not available yet.

## 2015-05-12 ENCOUNTER — Telehealth: Payer: Self-pay | Admitting: Cardiovascular Disease

## 2015-05-12 NOTE — Telephone Encounter (Signed)
Pt called in stating that she had a 48 hours monitor placed on 10/3 and she would like to know if those results were available . Please f/u with her  Thanks

## 2015-05-12 NOTE — Telephone Encounter (Signed)
Patient called and notified that report is in our system and the MD will review and she will be notified of monitor results - likely within next 2 days. She voiced understanding.

## 2015-05-14 ENCOUNTER — Telehealth: Payer: Self-pay | Admitting: *Deleted

## 2015-05-14 DIAGNOSIS — R9431 Abnormal electrocardiogram [ECG] [EKG]: Secondary | ICD-10-CM

## 2015-05-14 MED ORDER — NITROGLYCERIN 0.4 MG SL SUBL: 0.4000 mg | SUBLINGUAL_TABLET | SUBLINGUAL | Status: DC | PRN

## 2015-05-14 NOTE — Telephone Encounter (Signed)
LEFT MESSAGE WITH FAMILY MEMBER TO CALL BACK

## 2015-05-14 NOTE — Telephone Encounter (Signed)
Spoke to patient. Result given . Verbalized understanding Prescription -e-sent  instruction given to patient on how to take NTG. ECHO  ORDER. SCHEDULER AWARE TO DO BOTH TESTS SOONER

## 2015-05-14 NOTE — Telephone Encounter (Signed)
-----   Message from Skeet Latch, MD sent at 05/12/2015 10:39 PM EDT ----- Monitor showed frequent extra beats from the bottom chamber of the heart.  This can be a sign that areas of the heart are not getting adequate blood flow.  Please order an echo.  Also, can we get her stress test sooner than 10/27?

## 2015-05-15 ENCOUNTER — Ambulatory Visit (HOSPITAL_COMMUNITY)
Admission: RE | Admit: 2015-05-15 | Discharge: 2015-05-15 | Disposition: A | Discharge status: Home / Self Care | Payer: 59 | Source: Ambulatory Visit | Attending: Cardiovascular Disease | Admitting: Cardiovascular Disease

## 2015-05-15 DIAGNOSIS — R072 Precordial pain: Secondary | ICD-10-CM

## 2015-05-22 ENCOUNTER — Telehealth: Payer: Self-pay | Admitting: *Deleted

## 2015-05-22 ENCOUNTER — Telehealth: Payer: Self-pay | Admitting: Cardiovascular Disease

## 2015-05-22 LAB — EXERCISE TOLERANCE TEST
CHL CUP MPHR: 165 {beats}/min
CSEPED: 6 min
CSEPPHR: 171 {beats}/min
Estimated workload: 7.7 METS
Exercise duration (sec): 30 s
Percent HR: 103 %
RPE: 17
Rest HR: 106 {beats}/min

## 2015-05-22 NOTE — Telephone Encounter (Signed)
Spoke to patient. Result given . Verbalized understanding  

## 2015-05-22 NOTE — Telephone Encounter (Signed)
Explained results still pending signature by provider - pt voiced understanding.

## 2015-05-22 NOTE — Telephone Encounter (Signed)
Pt called in stating that she had a Stress test done on 10/14 and she wanted to know if those results were available. Please f/u with her  Thanks

## 2015-05-22 NOTE — Telephone Encounter (Signed)
-----   Message from Skeet Latch, MD sent at 05/22/2015  5:56 PM EDT ----- Normal stress test.

## 2015-05-25 ENCOUNTER — Ambulatory Visit (INDEPENDENT_AMBULATORY_CARE_PROVIDER_SITE_OTHER): Payer: 59 | Admitting: Gastroenterology

## 2015-05-25 ENCOUNTER — Encounter: Payer: Self-pay | Admitting: Gastroenterology

## 2015-05-25 VITALS — BP 110/74 | HR 80 | Wt 153.0 lb

## 2015-05-25 DIAGNOSIS — R059 Cough, unspecified: Secondary | ICD-10-CM

## 2015-05-25 DIAGNOSIS — K219 Gastro-esophageal reflux disease without esophagitis: Secondary | ICD-10-CM | POA: Diagnosis not present

## 2015-05-25 DIAGNOSIS — R05 Cough: Secondary | ICD-10-CM

## 2015-05-25 MED ORDER — OMEPRAZOLE 40 MG PO CPDR: 40.0000 mg | DELAYED_RELEASE_CAPSULE | Freq: Every day | ORAL | Status: DC

## 2015-05-25 NOTE — Progress Notes (Signed)
HPI: This is a   very pleasant 54 year old woman   who was referred to me by Leandrew Koyanagi, MD  to evaluate  cough, GERD .    Chief complaint is cough, GERD  Has had elevated HR, hacking cough.  Generally fatigued.  Has felt queazy; awoke with burning in throat several weeks ago.  She was asked to take zantac.  Has never really had GERD issues.  Overall her weight has been stable.  No vomiting.  NO overt GI bleeding.  PRN tylenol, PRN NSAIDs not even daily.  No vomiting.  No abd pains.  Her dad had ulcers.  No colon cancer in the family.  Labs 03/2015: cbc, cmet were normal FOBT negative 2015.  Has been negative for several years per patient  General queziness is new for past several months.  No real bowel changes.   Review of systems: Pertinent positive and negative review of systems were noted in the above HPI section. Complete review of systems was performed and was otherwise normal.   Past Medical History  Diagnosis Date  . GERD (gastroesophageal reflux disease)     Past Surgical History  Procedure Laterality Date  . Tonsillectomy      when she was 3    Current Outpatient Prescriptions  Medication Sig Dispense Refill  . acetaminophen (TYLENOL) 500 MG tablet Take 500 mg by mouth every 6 (six) hours as needed for moderate pain.    Marland Kitchen aspirin 81 MG tablet Take 81 mg by mouth daily.    . cholecalciferol (VITAMIN D) 1000 UNITS tablet Take 1,000 Units by mouth daily.    Marland Kitchen ibuprofen (ADVIL,MOTRIN) 200 MG tablet Take 200 mg by mouth every 6 (six) hours as needed for mild pain.    . Multiple Vitamins-Minerals (MULTIVITAMIN WITH MINERALS) tablet Take 1 tablet by mouth daily.    . nitroGLYCERIN (NITROSTAT) 0.4 MG SL tablet Place 1 tablet (0.4 mg total) under the tongue every 5 (five) minutes as needed for chest pain. 25 tablet 6  . ranitidine (ZANTAC) 75 MG tablet Take 75 mg by mouth once.    . thiamine (VITAMIN B-1) 100 MG tablet Take 100 mg by mouth daily.     No  current facility-administered medications for this visit.    Allergies as of 05/25/2015 - Review Complete 05/25/2015  Allergen Reaction Noted  . Tetanus toxoids Other (See Comments) 07/27/2012    Family History  Problem Relation Age of Onset  . Heart disease Mother   . COPD Mother   . Emphysema Mother   . Heart disease Father   . Diabetes Father   . COPD Father   . Vision loss Father   . Skin cancer Father     skin  . Hyperlipidemia Father   . Hypertension Father   . Heart disease Sister   . Graves' disease Sister   . Diabetes Sister   . Hyperlipidemia Sister   . Heart failure Sister 15  . Heart disease Brother   . Hyperlipidemia Brother   . Cancer Paternal Grandmother     unknown  . Prostate cancer Paternal Grandfather     Prostate cancer  . Cancer Mother     oral    Social History   Social History  . Marital Status: Married    Spouse Name: N/A  . Number of Children: 5  . Years of Education: N/A   Occupational History  . mother    Social History Main Topics  . Smoking status: Never  Smoker   . Smokeless tobacco: Never Used  . Alcohol Use: No  . Drug Use: No  . Sexual Activity: Not on file   Other Topics Concern  . Not on file   Social History Narrative   Married.  Education: [employer].           Physical Exam: BP 110/74 mmHg  Pulse 80  Wt 153 lb (69.4 kg) Constitutional: generally well-appearing Psychiatric: alert and oriented x3 Eyes: extraocular movements intact Mouth: oral pharynx moist, no lesions Neck: supple no lymphadenopathy Cardiovascular: heart regular rate and rhythm Lungs: clear to auscultation bilaterally Abdomen: soft, nontender, nondistended, no obvious ascites, no peritoneal signs, normal bowel sounds Extremities: no lower extremity edema bilaterally Skin: no lesions on visible extremities   Assessment and plan: 54 y.o. female with  cough, mild GERD  Cough can be caused, exacerbated by GERD at times. She really has mild  typical GERD symptoms however. She has no alarm symptoms that warrant upper endoscopy. I recommended she change from H2 blocker to daily proton pump inhibitor and she will return after 3 months of therapy. I did explain to her that if her cough is in part due to GERD it can take 2-3 months of good antiacid therapy to notice improvement.  She prefers annual Hemoccult testing for her colon cancer screening and she knows to continue that.   Owens Loffler, MD [facility] 05/25/2015, 9:23 AM  Cc: Leandrew Koyanagi, MD

## 2015-05-25 NOTE — Patient Instructions (Signed)
Start omeprazole (40mg  pill, take 20-30 min before BF meal). Please return to see Dr. Ardis Hughs in 3 months. Continue colon cancer screening as you have been doing.

## 2015-05-27 ENCOUNTER — Other Ambulatory Visit: Payer: Self-pay

## 2015-05-27 ENCOUNTER — Ambulatory Visit (HOSPITAL_COMMUNITY): Payer: 59 | Attending: Cardiology

## 2015-05-27 DIAGNOSIS — I358 Other nonrheumatic aortic valve disorders: Secondary | ICD-10-CM | POA: Diagnosis not present

## 2015-05-27 DIAGNOSIS — I340 Nonrheumatic mitral (valve) insufficiency: Secondary | ICD-10-CM | POA: Diagnosis not present

## 2015-05-27 DIAGNOSIS — R9431 Abnormal electrocardiogram [ECG] [EKG]: Secondary | ICD-10-CM | POA: Insufficient documentation

## 2015-05-27 DIAGNOSIS — Z8249 Family history of ischemic heart disease and other diseases of the circulatory system: Secondary | ICD-10-CM | POA: Insufficient documentation

## 2015-05-28 ENCOUNTER — Telehealth: Payer: Self-pay | Admitting: *Deleted

## 2015-05-28 ENCOUNTER — Inpatient Hospital Stay (HOSPITAL_COMMUNITY): Admission: RE | Admit: 2015-05-28 | Payer: 59 | Source: Ambulatory Visit

## 2015-05-28 NOTE — Telephone Encounter (Signed)
-----   Message from Skeet Latch, MD sent at 05/28/2015  8:05 AM EDT ----- Echo was mildly abnormal.  Please schedule follow up to discuss.

## 2015-05-28 NOTE — Telephone Encounter (Signed)
Spoke to patient. Result given . Verbalized understanding appt schedule 06/11/15 4 pm

## 2015-06-11 ENCOUNTER — Ambulatory Visit (INDEPENDENT_AMBULATORY_CARE_PROVIDER_SITE_OTHER): Payer: 59 | Admitting: Family Medicine

## 2015-06-11 ENCOUNTER — Encounter: Payer: Self-pay | Admitting: Cardiovascular Disease

## 2015-06-11 ENCOUNTER — Encounter: Payer: Self-pay | Admitting: Family Medicine

## 2015-06-11 ENCOUNTER — Ambulatory Visit (INDEPENDENT_AMBULATORY_CARE_PROVIDER_SITE_OTHER): Payer: 59 | Admitting: Cardiovascular Disease

## 2015-06-11 VITALS — BP 135/87 | HR 89 | Ht 59.5 in | Wt 152.8 lb

## 2015-06-11 VITALS — BP 137/84 | HR 99 | Temp 97.9°F | Resp 16 | Ht 60.5 in | Wt 152.0 lb

## 2015-06-11 DIAGNOSIS — Z113 Encounter for screening for infections with a predominantly sexual mode of transmission: Secondary | ICD-10-CM

## 2015-06-11 DIAGNOSIS — Z1389 Encounter for screening for other disorder: Secondary | ICD-10-CM | POA: Diagnosis not present

## 2015-06-11 DIAGNOSIS — R700 Elevated erythrocyte sedimentation rate: Secondary | ICD-10-CM | POA: Diagnosis not present

## 2015-06-11 DIAGNOSIS — R931 Abnormal findings on diagnostic imaging of heart and coronary circulation: Secondary | ICD-10-CM | POA: Diagnosis not present

## 2015-06-11 DIAGNOSIS — Z1211 Encounter for screening for malignant neoplasm of colon: Secondary | ICD-10-CM | POA: Diagnosis not present

## 2015-06-11 DIAGNOSIS — Z0000 Encounter for general adult medical examination without abnormal findings: Secondary | ICD-10-CM

## 2015-06-11 DIAGNOSIS — Z8249 Family history of ischemic heart disease and other diseases of the circulatory system: Secondary | ICD-10-CM | POA: Diagnosis not present

## 2015-06-11 DIAGNOSIS — I493 Ventricular premature depolarization: Secondary | ICD-10-CM

## 2015-06-11 DIAGNOSIS — R350 Frequency of micturition: Secondary | ICD-10-CM

## 2015-06-11 DIAGNOSIS — R5383 Other fatigue: Secondary | ICD-10-CM | POA: Diagnosis not present

## 2015-06-11 DIAGNOSIS — Z01818 Encounter for other preprocedural examination: Secondary | ICD-10-CM

## 2015-06-11 DIAGNOSIS — D689 Coagulation defect, unspecified: Secondary | ICD-10-CM | POA: Diagnosis not present

## 2015-06-11 DIAGNOSIS — Z136 Encounter for screening for cardiovascular disorders: Secondary | ICD-10-CM

## 2015-06-11 DIAGNOSIS — E7800 Pure hypercholesterolemia, unspecified: Secondary | ICD-10-CM

## 2015-06-11 DIAGNOSIS — R195 Other fecal abnormalities: Secondary | ICD-10-CM

## 2015-06-11 DIAGNOSIS — Z8349 Family history of other endocrine, nutritional and metabolic diseases: Secondary | ICD-10-CM

## 2015-06-11 DIAGNOSIS — Z1383 Encounter for screening for respiratory disorder NEC: Secondary | ICD-10-CM | POA: Diagnosis not present

## 2015-06-11 DIAGNOSIS — R7303 Prediabetes: Secondary | ICD-10-CM | POA: Diagnosis not present

## 2015-06-11 DIAGNOSIS — I5032 Chronic diastolic (congestive) heart failure: Secondary | ICD-10-CM

## 2015-06-11 HISTORY — DX: Ventricular premature depolarization: I49.3

## 2015-06-11 LAB — HIV ANTIBODY (ROUTINE TESTING W REFLEX): HIV: NONREACTIVE

## 2015-06-11 LAB — POCT GLYCOSYLATED HEMOGLOBIN (HGB A1C): HEMOGLOBIN A1C: 6.1

## 2015-06-11 LAB — POC MICROSCOPIC URINALYSIS (UMFC): Mucus: ABSENT

## 2015-06-11 LAB — POCT URINALYSIS DIP (MANUAL ENTRY)
Bilirubin, UA: NEGATIVE
Glucose, UA: NEGATIVE
Ketones, POC UA: NEGATIVE
Leukocytes, UA: NEGATIVE
Nitrite, UA: NEGATIVE
PROTEIN UA: NEGATIVE
Spec Grav, UA: 1.02
UROBILINOGEN UA: 0.2
pH, UA: 5.5

## 2015-06-11 LAB — LIPID PANEL
Cholesterol: 221 mg/dL — ABNORMAL HIGH (ref 125–200)
HDL: 53 mg/dL (ref >=46)
LDL Cholesterol: 139 mg/dL — ABNORMAL HIGH (ref <130)
TRIGLYCERIDES: 144 mg/dL (ref <150)
Total CHOL/HDL Ratio: 4.2 Ratio (ref <=5.0)
VLDL: 29 mg/dL (ref <30)

## 2015-06-11 LAB — GLUCOSE, POCT (MANUAL RESULT ENTRY): POC Glucose: 132 mg/dl — AB (ref 70–99)

## 2015-06-11 LAB — HEMOGLOBIN A1C: HEMOGLOBIN A1C: 6.1 % — AB (ref 4.0–6.0)

## 2015-06-11 LAB — VITAMIN B12: VITAMIN B 12: 1051 pg/mL — AB (ref 211–911)

## 2015-06-11 LAB — HEPATITIS C ANTIBODY: HCV Ab: NEGATIVE

## 2015-06-11 LAB — C-REACTIVE PROTEIN

## 2015-06-11 MED ORDER — METOPROLOL TARTRATE 25 MG PO TABS: 12.5000 mg | ORAL_TABLET | Freq: Two times a day (BID) | ORAL | Status: DC

## 2015-06-11 NOTE — Progress Notes (Signed)
Subjective:     Patient ID: Emily [name], female    DOB: 05-14-1961, 54 y.o.   MRN: [id number] Chief Complaint  Patient presents with  . Annual Exam     HPI  HPI Comments: Emily [name] is a 54 y.o. female who presents to the [facility] for a completed physical. I last saw her 1 yr prev for a routine physical..   Pt saw my partner Dr. Laney Pastor 10 wks prior for fatigue and episodic palpitations/chest pressure for several weeks prior. She was referred to cardiology for further workup and had a 48 hr Holter monitor and and exercise tolerance test last mo. ETT was normal though echo was mildly abnml and pt has appt sched with cardiology this afternoon to review. She was instructed to cont asa 41m/d. She had an elev ESR at 60 at that time but nml cbc, cmp nml other than elev gluc 117-119 over past 2 yrs, nml tsh and free t4.  She was also referred to GI - saw Dr. JArdis Hughs- who recommended that pt step up therapy to ppi from H2 blocker to see if improvement in cough and CP and recheck after 3 mos with GO   Imm:  She is allergic to Tetanus vaccine. flu shot - pt declines  PAP: UTD. Pt reports no h/o abnormal pap.  Normal pap here 07/2013 - due for repeat in December 2017. No vaginal bleeding since March 2016  Mam: UTD. done 07/2014 with Rt breast asymetry so had [place] then repeated in f/u 6 mos later in 04/2015 which was unchanged so pt is recommended to have bilateral diagnostic mammogram in Jan 2017 to show 1 yr of stability in the abnormality  Colonoscopy: she has refused prior.  She has not had a prior colonoscopy, but negative Hemosures annually. Discussed this with GI last mo.  Labs: Chol elevated prior but good HDL so work on tlc as ASCVD risk was 1.9% last yr. Pt did have pre-DM with a1c 5.9. Nml vit D 1 yr prior at 357 362the yr prior. Is taking vit D 108mqd and vit B complex but no calcium.  She does get an adequat amount of calcium in her diet.   Vitamins/Supp:  She states that she takes a complete multi-vitamin which includes vitamin B. She states that she takes 2000 mg of vitamin D.  Fish oil upsets her stomach. Uses olive oil and canola oil but not a lot of other good sources of HDL.   family history is significant for her sister with a history of graves disease. She states that her mother died of heart failure at age 6447nd her sister had a postpartum MI at age 37108ue to a postpartum.   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  Past Medical History  Diagnosis Date  . GERD (gastroesophageal reflux disease)      Current Outpatient Prescriptions on File Prior to Visit  Medication Sig Dispense Refill  . acetaminophen (TYLENOL) 500 MG tablet Take 500 mg by mouth every 6 (six) hours as needed for moderate pain.    . Marland Kitchenspirin 81 MG tablet Take 81 mg by mouth daily.    . cholecalciferol (VITAMIN D) 1000 UNITS tablet Take 1,000 Units by mouth daily.    . Marland Kitchenbuprofen (ADVIL,MOTRIN) 200 MG tablet Take 200 mg by mouth every 6 (six) hours as needed for mild pain.    . Multiple Vitamins-Minerals (MULTIVITAMIN  WITH MINERALS) tablet Take 1 tablet by mouth daily.    . nitroGLYCERIN (NITROSTAT) 0.4 MG SL tablet Place 1 tablet (0.4 mg total) under the tongue every 5 (five) minutes as needed for chest pain. 25 tablet 6  . omeprazole (PRILOSEC) 40 MG capsule Take 1 capsule (40 mg total) by mouth daily. 90 capsule 3  . thiamine (VITAMIN B-1) 100 MG tablet Take 100 mg by mouth daily.     No current facility-administered medications on file prior to visit.   Allergies  Allergen Reactions  . Tetanus Toxoids Other (See Comments)    Big red rash and swelling in the muscle   Past Surgical History  Procedure Laterality Date  . Tonsillectomy      when she was 3   Social History   Social History  . Marital Status: Married    Spouse Name: N/A  . Number of Children: 5  . 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 Smoking status: Never Smoker   . Smokeless tobacco: Never Used  . Alcohol Use: No  . Drug Use: No  . Sexual Activity: Not on file   Other Topics Concern  . Not on file   Social History Narrative   Married.  Education: [employer].         Family History  Problem Relation Age of Onset  . Heart disease Mother   . COPD Mother   . Emphysema Mother   . Heart disease Father   . Diabetes Father   . COPD Father   . Vision loss Father   . Skin cancer Father     skin  . Hyperlipidemia Father   . Hypertension Father   . Heart disease Sister   . Graves' disease Sister   . Diabetes Sister   . Hyperlipidemia Sister   . Heart failure Sister 11  . Heart disease Brother   . Hyperlipidemia Brother   . Cancer Paternal Grandmother     unknown  . Prostate cancer Paternal Grandfather     Prostate cancer  . Cancer Mother     oral     Review of Systems  Constitutional: Negative for fever, chills, appetite change and fatigue.  Respiratory: Negative for chest tightness.   Cardiovascular: Negative for chest pain.  Gastrointestinal: Negative for nausea, vomiting, abdominal pain and diarrhea.  Genitourinary: Negative for dysuria, urgency, frequency and vaginal discharge.  All other systems reviewed and are negative.   Objective:  Physical Exam  Constitutional: She is oriented to person, place, and time. She appears well-developed and well-nourished. No distress.  HENT:  Head: Normocephalic and atraumatic.  Right Ear: Tympanic membrane, external ear and ear canal normal.  Left Ear: Tympanic membrane, external ear and ear canal normal.  Nose: Nose normal. No mucosal edema or rhinorrhea.  Mouth/Throat: Uvula is midline, oropharynx is clear and moist and mucous membranes are normal. No posterior oropharyngeal erythema.  Eyes: Conjunctivae and EOM are normal. Pupils are equal, round, and reactive to light. Right eye exhibits no discharge. Left eye exhibits no discharge. No  scleral icterus.  Neck: Normal range of motion. Neck supple. No thyromegaly present.  Cardiovascular: Normal rate, regular rhythm, normal heart sounds and intact distal pulses.   Pulmonary/Chest: Effort normal and breath sounds normal. No respiratory distress.  Abdominal: Soft. Bowel sounds are normal. There is no tenderness.  Genitourinary: No breast swelling, tenderness, discharge or bleeding.  Musculoskeletal: She exhibits no  edema.  Lymphadenopathy:    She has no cervical adenopathy.  Neurological: She is alert and oriented to person, place, and time. She has normal reflexes.  Skin: Skin is warm and dry. She is not diaphoretic. No erythema.  Psychiatric: She has a normal mood and affect. Her behavior is normal.   BP 137/84 mmHg  Pulse 99  Temp(Src) 97.9 F (36.6 C)  Resp 16  Ht 5' 0.5" (1.537 m)  Wt 152 lb (68.947 kg)  BMI 29.19 kg/m2   Assessment & Plan:   1. Annual physical exam   2. Family history of early CAD   3. Family history of thyroid disorder   4. Elevated LDL cholesterol level   5. Prediabetes - 5.9 -> 6.1 today  6. Routine screening for STI (sexually transmitted infection)   7. Screening for cardiovascular, respiratory, and genitourinary diseases   8. Other fatigue   9. Elevated sedimentation rate - while feeling fatigued and tachycardic - appears to have now resolved.  10. Special screening for malignant neoplasms, colon - pt elects to do annual FOBT so home hemasure given.  11. Urinary frequency     Orders Placed This Encounter  Procedures  . Urine culture  . HIV antibody  . Hepatitis C Antibody  . Lipid panel    Order Specific Question:  Has the patient fasted?    Answer:  Yes  . C-reactive protein  . Sedimentation rate  . Vitamin B12  . POCT glycosylated hemoglobin (Hb A1C)  . IFOBT POC (occult bld, rslt in office)  . POCT urinalysis dipstick  . POCT Microscopic Urinalysis ([facility])  . POCT glucose (manual entry)     Delman Cheadle, MD  [facility]  Results for orders placed or performed in visit on 06/11/15  HIV antibody  Result Value Ref Range   HIV 1&2 Ab, 4th Generation NONREACTIVE NONREACTIVE  Hepatitis C Antibody  Result Value Ref Range   HCV Ab NEGATIVE NEGATIVE  Lipid panel  Result Value Ref Range   Cholesterol 221 (H) 125 - 200 mg/dL   Triglycerides 144 <150 mg/dL   HDL 53 >=46 mg/dL   Total CHOL/HDL Ratio 4.2 <=5.0 Ratio   VLDL 29 <30 mg/dL   LDL Cholesterol 139 (H) <130 mg/dL  C-reactive protein  Result Value Ref Range   CRP <0.5 <0.60 mg/dL  Sedimentation rate  Result Value Ref Range   Sed Rate 7 0 - 30 mm/hr  Vitamin B12  Result Value Ref Range   Vitamin B-12 1051 (H) 211 - 911 pg/mL  POCT glycosylated hemoglobin (Hb A1C)  Result Value Ref Range   Hemoglobin A1C 6.1   POCT urinalysis dipstick  Result Value Ref Range   Color, UA other (A) yellow   Clarity, UA cloudy (A) clear   Glucose, UA negative negative   Bilirubin, UA negative negative   Ketones, POC UA negative negative   Spec Grav, UA 1.020    Blood, UA trace-intact (A) negative   pH, UA 5.5    Protein Ur, POC negative negative   Urobilinogen, UA 0.2    Nitrite, UA Negative Negative   Leukocytes, UA Negative Negative  POCT Microscopic Urinalysis ([facility])  Result Value Ref Range   WBC,UR,HPF,POC None None WBC/hpf   RBC,UR,HPF,POC Few (A) None RBC/hpf   Bacteria None None, Too numerous to count   Mucus Absent Absent   Epithelial Cells, UR Per Microscopy Few (A) None, Too numerous to count cells/hpf  POCT glucose (manual entry)  Result Value Ref  Range   POC Glucose 132 (A) 70 - 99 mg/dl

## 2015-06-11 NOTE — Progress Notes (Signed)
Cardiology Office Note   Date:  06/11/2015   ID:  Emily [name], DOB [birth year], MRN [id number]  PCP:  Emily Cheadle, MD  Cardiologist:   Emily Harness, MD   Chief Complaint  Patient presents with  . Follow-up    mildly abnormal echo// pt c/o licht chest discomfort and SOB/wheezing     Patient ID: Emily [name] is a 54 y.o. female with hyperlipidemia and family history of premature CAD here for follow up on chest pain, shortness of breath and palpitations.  Fore the last couple of day she has been feeling tired.  She has some chest tightness now  She had some wheezingduring some family stress.  No LE edema  L foot pain Coughing today  Stacking pillows at least 2 pillows Wakes up coughing  She hasn't increased her exercise.  She take care of her 2 grandchildren each day.  10 mo and 2 years.  Extra steps  Lipids pending   History of Present Illness hese symptoms have been ongoing for several months. She reports chest congestion and heaviness that occurs almost daily. She cannot associated with any particular activity. It occurs both at rest and with exertion. The symptoms last up to 2 days at a time and INR of 3-4 out of 10 in severity. There is no radiation occurs substernally. There is associated shortness of breath, nausea, and diaphoresis, though she is unable to determine if it's worse when she's having chest pressure. She notes that she is also perimenopausal and is unsure whether the symptoms may be related to this.  Emily [name] denies lower extremity edema, orthopnea, or PND. She does note palpitations and that her resting heart rate has been over 100 at times. This is different, as in the past her heart rate was in the 70s to 80's. This occurs almost daily and is associated with chest pressure and lightheadedness. She denies syncope. Emily [name] also notes extreme fatigue and feeling as though she needs to lay down at the end of the day. These symptoms have improved somewhat  over the last month, but still persist.  Emily [name] was evaluated by her PCP, Dr. Laney [name], on 03/23/15. At that appointment ECG showed sinus rhythm with borderline short PR interval.  Basic metabolic panel, CBC, and thyroid function were within normal limits. She was noted to have a elevated sedimentation rate at 60.  Dr. Laney [name] recommended that she take allergy and stomach medicine, which helped somewhat, but did not alleviate the symptoms completely.  Emily [name] mother died of a myocardial infarction at age 38. Her sister had peripartum cardiopathy at age 28.  She does not get any formal exercise but she cares for her 59 and 61 month old grandchildren all day.  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  She cooks most of her meals but notes that her diet could be better.  She is trying to increase her fiber and vegetable intake.  She drinks water, unsweetened tea and one cup of coffee daily.    Past Medical History  Diagnosis Date  . GERD (gastroesophageal reflux disease)     Past Surgical History  Procedure Laterality Date  . Tonsillectomy      when she was 3     Current Outpatient Prescriptions  Medication Sig Dispense Refill  . acetaminophen (TYLENOL) 500 MG tablet Take 500 mg by mouth every 6 (six) hours as needed for moderate pain.    Marland Kitchen aspirin 81 MG tablet Take  81 mg by mouth daily.    . cholecalciferol (VITAMIN D) 1000 UNITS tablet Take 1,000 Units by mouth daily.    Marland Kitchen ibuprofen (ADVIL,MOTRIN) 200 MG tablet Take 200 mg by mouth every 6 (six) hours as needed for mild pain.    . Multiple Vitamins-Minerals (MULTIVITAMIN WITH MINERALS) tablet Take 1 tablet by mouth daily.    . nitroGLYCERIN (NITROSTAT) 0.4 MG SL tablet Place 1 tablet (0.4 mg total) under the tongue every 5 (five) minutes as needed for chest pain. 25 tablet 6  . omeprazole (PRILOSEC) 40 MG capsule Take 1 capsule (40 mg total) by mouth daily. 90 capsule 3  . thiamine (VITAMIN B-1) 100 MG tablet Take 100 mg by  mouth daily.     No current facility-administered medications for this visit.    Allergies:   Hornet venom and Tetanus toxoids    Social History:  The patient  reports that she has never smoked. She has never used smokeless tobacco. She reports that she does not drink alcohol or use illicit drugs.   Family History:  [name] family history includes COPD in her father and mother; Cancer in her mother and paternal grandmother; Diabetes in her father and sister; Emphysema in her mother; [name]' disease in her sister; Heart disease in her brother, father, mother, and sister; Heart failure (age of onset: 27) in her sister; Hyperlipidemia in her brother, father, and sister; Hypertension in her father; Prostate cancer in her paternal grandfather; Skin cancer in her father; Vision loss in her father.    ROS:  Please see the history of present illness.   Otherwise, review of systems are positive for none.   All other systems are reviewed and negative.    PHYSICAL EXAM: VS:  BP 135/87 mmHg  Pulse 89  Ht 4' 11.5" (1.511 m)  Wt 69.31 kg (152 lb 12.8 oz)  BMI 30.36 kg/m2 , BMI Body mass index is 30.36 kg/(m^2). GENERAL:  Well appearing HEENT:  Pupils equal round and reactive, fundi not visualized, oral mucosa unremarkable NECK:  No jugular venous distention, waveform within normal limits, carotid upstroke brisk and symmetric, no bruits, no thyromegaly LYMPHATICS:  No cervical adenopathy LUNGS:  Clear to auscultation bilaterally HEART:  RRR.  PMI not displaced or sustained,S1 and S2 within normal limits, no S3, no S4, no clicks, no rubs, no murmurs ABD:  Flat, positive bowel sounds normal in frequency in pitch, no bruits, no rebound, no guarding, no midline pulsatile mass, no hepatomegaly, no splenomegaly EXT:  2 plus pulses throughout, no edema, no cyanosis no clubbing SKIN:  No rashes no nodules NEURO:  Cranial nerves II through XII grossly intact, motor grossly intact throughout PSYCH:   Cognitively intact, oriented to person place and time    EKG:  EKG is ordered today. The ekg ordered today demonstrates sinus rhythm at 91 bpm.   Recent Labs: 07/04/2014: Platelets 260 03/23/2015: ALT 21; BUN 19; Creat 0.70; Hemoglobin 14.5; Potassium 5.1; Sodium 142; TSH 1.103    Lipid Panel    Component Value Date/Time   CHOL 221* 06/11/2015 1054   TRIG 144 06/11/2015 1054   HDL 53 06/11/2015 1054   CHOLHDL 4.2 06/11/2015 1054   VLDL 29 06/11/2015 1054   LDLCALC 139* 06/11/2015 1054   LDLDIRECT 129* 07/27/2012 0809      Wt Readings from Last 3 Encounters:  06/11/15 69.31 kg (152 lb 12.8 oz)  06/11/15 68.947 kg (152 lb)  05/25/15 69.4 kg (153 lb)  Other studies Reviewed: Additional studies/ records that were reviewed today include: . Review of the above records demonstrates:  Please see elsewhere in the note.     ASSESSMENT AND PLAN:  # Chest pain concerning for angina: Emily [name]'s chest pain is atypical.  However, her Holter showed frequent PVCs and short runs of NSVT. Her treadmill stress test was negative for ischemia. However her echo showed inferior and inferolateral hypokinesis with this constellation of findings, we'll pursue cardiac catheterization to rule out obstructive coronary disease. Reviewed the option of cardiac CT angiography. However she would prefer a more definitive and possibly therapeutic strategy.    # CV Disease Prevention: Her ASCVD 10 year risk is 2%.  Therefore, we do not recommend statin therapy at this time.  This could change pending the results of her cardiac catheterization.  # PVCs: Heart catheterization pending as above.  Thyroid function, CBC and basic metabolic panel have been unremarkable.  # Chronic diastolic dysfunction: Emily [name] echo revealed grade 1 diastolic dysfunction.  She may benefit from thank you lowering her heart rate to improve ventricular filling. Hopefully this will also improve her shortness of breath.     Current medicines are reviewed at length with the patient today.  The patient does not have concerns regarding medicines.  The following changes have been made: metoprolol 12.5 mg every 12 hours.   Labs/ tests ordered today include:   No orders of the defined types were placed in this encounter.     Disposition:   FU with Emily Herrada C. Oval Linsey, MD  after cardiac catheterization   Signed, Emily Harness, MD  06/11/2015 4:35 PM    [place]

## 2015-06-11 NOTE — Patient Instructions (Addendum)
Your physician recommends that you schedule a follow-up appointment  After cath--  2 weeks Start metoprolol tart 12.5 mg ( 1/2 tablet to of 25 mg ) take twice a day.  Schedule with Dr Ellyn Hack- RIGHT RADIAL--LEFT HEART CATHYour physician has requested that you have a cardiac catheterization. Cardiac catheterization is used to diagnose and/or treat various heart conditions. Doctors may recommend this procedure for a number of different reasons. The most common reason is to evaluate chest pain. Chest pain can be a symptom of coronary artery disease (CAD), and cardiac catheterization can show whether plaque is narrowing or blocking your heart's arteries. This procedure is also used to evaluate the valves, as well as measure the blood flow and oxygen levels in different parts of your heart. For further information please visit [email or web address]. Please follow instruction sheet, as given.  Labs- cbc,bmp ,pt,ptt   Coronary Angiogram (Heart catheterization)  A coronary angiogram, also called coronary angiography, is an X-ray procedure used to look at the arteries in the heart. In this procedure, a dye (contrast dye) is injected through a long, hollow tube (catheter). The catheter is about the size of a piece of cooked spaghetti and is inserted through your groin, wrist, or arm. The dye is injected into each artery, and X-rays are then taken to show if there is a blockage in the arteries of your heart. LET [facility] CARE PROVIDER KNOW ABOUT:  Any allergies you have, including allergies to shellfish or contrast dye.   All medicines you are taking, including vitamins, herbs, eye drops, creams, and over-the-counter medicines.   Previous problems you or members of your family have had with the use of anesthetics.   Any blood disorders you have.   Previous surgeries you have had.  History of kidney problems or failure.   Other medical conditions you have. RISKS AND COMPLICATIONS  Generally, a  coronary angiogram is a safe procedure. However, problems can occur and include:  Allergic reaction to the dye.  Bleeding from the access site or other locations.  Kidney injury, especially in people with impaired kidney function.  Stroke (rare).  Heart attack (rare). BEFORE THE PROCEDURE   Do not eat or drink anything after midnight the night before the procedure or as directed by your health care provider.   Ask your health care provider about changing or stopping your regular medicines. This is especially important if you are taking diabetes medicines or blood thinners. PROCEDURE  You may be given a medicine to help you relax (sedative) before the procedure. This medicine is given through an intravenous (IV) access tube that is inserted into one of your veins.   The area where the catheter will be inserted will be washed and shaved. This is usually done in the groin but may be done in the fold of your arm (near your elbow) or in the wrist.   A medicine will be given to numb the area where the catheter will be inserted (local anesthetic).   The health care provider will insert the catheter into an artery. The catheter will be guided by using a special type of X-ray (fluoroscopy) of the blood vessel being examined.   A special dye will then be injected into the catheter, and X-rays will be taken. The dye will help to show where any narrowing or blockages are located in the heart arteries.  AFTER THE PROCEDURE   If the procedure is done through the leg, you will be kept in bed lying flat for  several hours. You will be instructed to not bend or cross your legs.  The insertion site will be checked frequently.   The pulse in your feet or wrist will be checked frequently.   Additional blood tests, X-rays, and an electrocardiogram may be done.    This information is not intended to replace advice given to you by your health care provider. Make sure you discuss any questions  you have with your health care provider.   Document Released: 01/22/2003 Document Revised: 08/08/2014 Document Reviewed: 12/10/2012 Elsevier Interactive Patient Education [employer].

## 2015-06-11 NOTE — Patient Instructions (Signed)
Health Maintenance, Female Adopting a healthy lifestyle and getting preventive care can go a long way to promote health and wellness. Talk with your health care provider about what schedule of regular examinations is right for you. This is a good chance for you to check in with your provider about disease prevention and staying healthy. In between checkups, there are plenty of things you can do on your own. Experts have done a lot of research about which lifestyle changes and preventive measures are most likely to keep you healthy. Ask your health care provider for more information. WEIGHT AND DIET  Eat a healthy diet  Be sure to include plenty of vegetables, fruits, low-fat dairy products, and lean protein.  Do not eat a lot of foods high in solid fats, added sugars, or salt.  Get regular exercise. This is one of the most important things you can do for your health.  Most adults should exercise for at least 150 minutes each week. The exercise should increase your heart rate and make you sweat (moderate-intensity exercise).  Most adults should also do strengthening exercises at least twice a week. This is in addition to the moderate-intensity exercise.  Maintain a healthy weight  Body mass index (BMI) is a measurement that can be used to identify possible weight problems. It estimates body fat based on height and weight. Your health care provider can help determine your BMI and help you achieve or maintain a healthy weight.  For females 20 years of age and older:   A BMI below 18.5 is considered underweight.  A BMI of 18.5 to 24.9 is normal.  A BMI of 25 to 29.9 is considered overweight.  A BMI of 30 and above is considered obese.  Watch levels of cholesterol and blood lipids  You should start having your blood tested for lipids and cholesterol at 55 years of age, then have this test every 5 years.  You may need to have your cholesterol levels checked more often if:  Your lipid  or cholesterol levels are high.  You are older than 55 years of age.  You are at high risk for heart disease.  CANCER SCREENING   Lung Cancer  Lung cancer screening is recommended for adults 55-80 years old who are at high risk for lung cancer because of a history of smoking.  A yearly low-dose CT scan of the lungs is recommended for people who:  Currently smoke.  Have quit within the past 15 years.  Have at least a 30-pack-year history of smoking. A pack year is smoking an average of one pack of cigarettes a day for 1 year.  Yearly screening should continue until it has been 15 years since you quit.  Yearly screening should stop if you develop a health problem that would prevent you from having lung cancer treatment.  Breast Cancer  Practice breast self-awareness. This means understanding how your breasts normally appear and feel.  It also means doing regular breast self-exams. Let your health care provider know about any changes, no matter how small.  If you are in your 20s or 30s, you should have a clinical breast exam (CBE) by a health care provider every 1-3 years as part of a regular health exam.  If you are 40 or older, have a CBE every year. Also consider having a breast X-ray (mammogram) every year.  If you have a family history of breast cancer, talk to your health care provider about genetic screening.  If you   are at high risk for breast cancer, talk to your health care provider about having an MRI and a mammogram every year.  Breast cancer gene (BRCA) assessment is recommended for women who have family members with BRCA-related cancers. BRCA-related cancers include:  Breast.  Ovarian.  Tubal.  Peritoneal cancers.  Results of the assessment will determine the need for genetic counseling and BRCA1 and BRCA2 testing. Cervical Cancer Your health care provider may recommend that you be screened regularly for cancer of the pelvic organs (ovaries, uterus, and  vagina). This screening involves a pelvic examination, including checking for microscopic changes to the surface of your cervix (Pap test). You may be encouraged to have this screening done every 3 years, beginning at age 21.  For women ages 30-65, health care providers may recommend pelvic exams and Pap testing every 3 years, or they may recommend the Pap and pelvic exam, combined with testing for human papilloma virus (HPV), every 5 years. Some types of HPV increase your risk of cervical cancer. Testing for HPV may also be done on women of any age with unclear Pap test results.  Other health care providers may not recommend any screening for nonpregnant women who are considered low risk for pelvic cancer and who do not have symptoms. Ask your health care provider if a screening pelvic exam is right for you.  If you have had past treatment for cervical cancer or a condition that could lead to cancer, you need Pap tests and screening for cancer for at least 20 years after your treatment. If Pap tests have been discontinued, your risk factors (such as having a new sexual partner) need to be reassessed to determine if screening should resume. Some women have medical problems that increase the chance of getting cervical cancer. In these cases, your health care provider may recommend more frequent screening and Pap tests. Colorectal Cancer  This type of cancer can be detected and often prevented.  Routine colorectal cancer screening usually begins at 55 years of age and continues through 55 years of age.  Your health care provider may recommend screening at an earlier age if you have risk factors for colon cancer.  Your health care provider may also recommend using home test kits to check for hidden blood in the stool.  A small camera at the end of a tube can be used to examine your colon directly (sigmoidoscopy or colonoscopy). This is done to check for the earliest forms of colorectal  cancer.  Routine screening usually begins at age 50.  Direct examination of the colon should be repeated every 5-10 years through 55 years of age. However, you may need to be screened more often if early forms of precancerous polyps or small growths are found. Skin Cancer  Check your skin from head to toe regularly.  Tell your health care provider about any new moles or changes in moles, especially if there is a change in a mole's shape or color.  Also tell your health care provider if you have a mole that is larger than the size of a pencil eraser.  Always use sunscreen. Apply sunscreen liberally and repeatedly throughout the day.  Protect yourself by wearing long sleeves, pants, a wide-brimmed hat, and sunglasses whenever you are outside. HEART DISEASE, DIABETES, AND HIGH BLOOD PRESSURE   High blood pressure causes heart disease and increases the risk of stroke. High blood pressure is more likely to develop in:  People who have blood pressure in the high end   of the normal range (130-139/85-89 mm Hg).  People who are overweight or obese.  People who are African American.  If you are 38-23 years of age, have your blood pressure checked every 3-5 years. If you are 61 years of age or older, have your blood pressure checked every year. You should have your blood pressure measured twice--once when you are at a hospital or clinic, and once when you are not at a hospital or clinic. Record the average of the two measurements. To check your blood pressure when you are not at a hospital or clinic, you can use:  An automated blood pressure machine at a pharmacy.  A home blood pressure monitor.  If you are between 45 years and 39 years old, ask your health care provider if you should take aspirin to prevent strokes.  Have regular diabetes screenings. This involves taking a blood sample to check your fasting blood sugar level.  If you are at a normal weight and have a low risk for diabetes,  have this test once every three years after 55 years of age.  If you are overweight and have a high risk for diabetes, consider being tested at a younger age or more often. PREVENTING INFECTION  Hepatitis B  If you have a higher risk for hepatitis B, you should be screened for this virus. You are considered at high risk for hepatitis B if:  You were born in a country where hepatitis B is common. Ask your health care provider which countries are considered high risk.  Your parents were born in a high-risk country, and you have not been immunized against hepatitis B (hepatitis B vaccine).  You have HIV or AIDS.  You use needles to inject street drugs.  You live with someone who has hepatitis B.  You have had sex with someone who has hepatitis B.  You get hemodialysis treatment.  You take certain medicines for conditions, including cancer, organ transplantation, and autoimmune conditions. Hepatitis C  Blood testing is recommended for:  Everyone born from [birth year] through 1966.  Anyone with known risk factors for hepatitis C. Sexually transmitted infections (STIs)  You should be screened for sexually transmitted infections (STIs) including gonorrhea and chlamydia if:  You are sexually active and are younger than 55 years of age.  You are older than 55 years of age and your health care provider tells you that you are at risk for this type of infection.  Your sexual activity has changed since you were last screened and you are at an increased risk for chlamydia or gonorrhea. Ask your health care provider if you are at risk.  If you do not have HIV, but are at risk, it may be recommended that you take a prescription medicine daily to prevent HIV infection. This is called pre-exposure prophylaxis (PrEP). You are considered at risk if:  You are sexually active and do not regularly use condoms or know the HIV status of your partner(s).  You take drugs by injection.  You are sexually  active with a partner who has HIV. Talk with your health care provider about whether you are at high risk of being infected with HIV. If you choose to begin PrEP, you should first be tested for HIV. You should then be tested every 3 months for as long as you are taking PrEP.  PREGNANCY   If you are premenopausal and you may become pregnant, ask your health care provider about preconception counseling.  If you may  become pregnant, take 400 to 800 micrograms (mcg) of folic acid every day.  If you want to prevent pregnancy, talk to your health care provider about birth control (contraception). OSTEOPOROSIS AND MENOPAUSE   Osteoporosis is a disease in which the bones lose minerals and strength with aging. This can result in serious bone fractures. Your risk for osteoporosis can be identified using a bone density scan.  If you are 61 years of age or older, or if you are at risk for osteoporosis and fractures, ask your health care provider if you should be screened.  Ask your health care provider whether you should take a calcium or vitamin D supplement to lower your risk for osteoporosis.  Menopause may have certain physical symptoms and risks.  Hormone replacement therapy may reduce some of these symptoms and risks. Talk to your health care provider about whether hormone replacement therapy is right for you.  HOME CARE INSTRUCTIONS   Schedule regular health, dental, and eye exams.  Stay current with your immunizations.   Do not use any tobacco products including cigarettes, chewing tobacco, or electronic cigarettes.  If you are pregnant, do not drink alcohol.  If you are breastfeeding, limit how much and how often you drink alcohol.  Limit alcohol intake to no more than 1 drink per day for nonpregnant women. One drink equals 12 ounces of beer, 5 ounces of wine, or 1 ounces of hard liquor.  Do not use street drugs.  Do not share needles.  Ask your health care provider for help if  you need support or information about quitting drugs.  Tell your health care provider if you often feel depressed.  Tell your health care provider if you have ever been abused or do not feel safe at home.   This information is not intended to replace advice given to you by your health care provider. Make sure you discuss any questions you have with your health care provider.   Document Released: 01/31/2011 Document Revised: 08/08/2014 Document Reviewed: 06/19/2013 Elsevier Interactive Patient Education [employer].

## 2015-06-12 ENCOUNTER — Encounter: Payer: Self-pay | Admitting: Cardiology

## 2015-06-12 LAB — URINE CULTURE
Colony Count: NO GROWTH
Organism ID, Bacteria: NO GROWTH

## 2015-06-12 LAB — SEDIMENTATION RATE: Sed Rate: 7 mm/hr (ref 0–30)

## 2015-06-13 ENCOUNTER — Encounter (HOSPITAL_COMMUNITY): Payer: Self-pay | Admitting: Cardiology

## 2015-06-13 DIAGNOSIS — I472 Ventricular tachycardia: Secondary | ICD-10-CM

## 2015-06-13 DIAGNOSIS — I4729 Other ventricular tachycardia: Secondary | ICD-10-CM

## 2015-06-13 HISTORY — DX: Ventricular tachycardia: I47.2

## 2015-06-13 HISTORY — DX: Other ventricular tachycardia: I47.29

## 2015-06-15 ENCOUNTER — Other Ambulatory Visit: Payer: Self-pay | Admitting: *Deleted

## 2015-06-15 DIAGNOSIS — Z01818 Encounter for other preprocedural examination: Secondary | ICD-10-CM

## 2015-06-16 LAB — CBC
HEMATOCRIT: 40.1 % (ref 36.0–46.0)
Hemoglobin: 13.3 g/dL (ref 12.0–15.0)
MCH: 28.9 pg (ref 26.0–34.0)
MCHC: 33.2 g/dL (ref 30.0–36.0)
MCV: 87.2 fL (ref 78.0–100.0)
MPV: 8.7 fL (ref 8.6–12.4)
Platelets: 269 10*3/uL (ref 150–400)
RBC: 4.6 MIL/uL (ref 3.87–5.11)
RDW: 13.6 % (ref 11.5–15.5)
WBC: 6.9 10*3/uL (ref 4.0–10.5)

## 2015-06-16 LAB — BASIC METABOLIC PANEL
BUN: 14 mg/dL (ref 7–25)
CHLORIDE: 103 mmol/L (ref 98–110)
CO2: 30 mmol/L (ref 20–31)
CREATININE: 0.58 mg/dL (ref 0.50–1.05)
Calcium: 9.2 mg/dL (ref 8.6–10.4)
Glucose, Bld: 105 mg/dL — ABNORMAL HIGH (ref 65–99)
POTASSIUM: 4.2 mmol/L (ref 3.5–5.3)
Sodium: 141 mmol/L (ref 135–146)

## 2015-06-16 LAB — APTT: APTT: 29 s (ref 24–37)

## 2015-06-16 LAB — PROTIME-INR
INR: 1.01 (ref <1.50)
Prothrombin Time: 13.4 seconds (ref 11.6–15.2)

## 2015-06-19 ENCOUNTER — Ambulatory Visit (HOSPITAL_COMMUNITY)
Admission: RE | Admit: 2015-06-19 | Discharge: 2015-06-19 | Disposition: A | Discharge status: Home / Self Care | Payer: 59 | Source: Ambulatory Visit | Attending: Cardiology | Admitting: Cardiology

## 2015-06-19 ENCOUNTER — Encounter (HOSPITAL_COMMUNITY): Payer: Self-pay | Admitting: Cardiology

## 2015-06-19 ENCOUNTER — Encounter (HOSPITAL_COMMUNITY)
Admission: RE | Disposition: A | Discharge status: Home / Self Care | Payer: Self-pay | Source: Ambulatory Visit | Attending: Cardiology

## 2015-06-19 DIAGNOSIS — I472 Ventricular tachycardia: Secondary | ICD-10-CM | POA: Insufficient documentation

## 2015-06-19 DIAGNOSIS — Z7982 Long term (current) use of aspirin: Secondary | ICD-10-CM | POA: Diagnosis not present

## 2015-06-19 DIAGNOSIS — I5189 Other ill-defined heart diseases: Secondary | ICD-10-CM | POA: Insufficient documentation

## 2015-06-19 DIAGNOSIS — E785 Hyperlipidemia, unspecified: Secondary | ICD-10-CM | POA: Insufficient documentation

## 2015-06-19 DIAGNOSIS — R0609 Other forms of dyspnea: Secondary | ICD-10-CM | POA: Insufficient documentation

## 2015-06-19 DIAGNOSIS — Z8249 Family history of ischemic heart disease and other diseases of the circulatory system: Secondary | ICD-10-CM | POA: Insufficient documentation

## 2015-06-19 DIAGNOSIS — I4729 Other ventricular tachycardia: Secondary | ICD-10-CM

## 2015-06-19 DIAGNOSIS — Z01818 Encounter for other preprocedural examination: Secondary | ICD-10-CM

## 2015-06-19 DIAGNOSIS — R931 Abnormal findings on diagnostic imaging of heart and coronary circulation: Secondary | ICD-10-CM

## 2015-06-19 DIAGNOSIS — K219 Gastro-esophageal reflux disease without esophagitis: Secondary | ICD-10-CM | POA: Insufficient documentation

## 2015-06-19 DIAGNOSIS — R079 Chest pain, unspecified: Secondary | ICD-10-CM | POA: Diagnosis not present

## 2015-06-19 HISTORY — PX: CARDIAC CATHETERIZATION: SHX172

## 2015-06-19 HISTORY — DX: Ventricular tachycardia: I47.2

## 2015-06-19 SURGERY — LEFT HEART CATH AND CORONARY ANGIOGRAPHY
Anesthesia: LOCAL

## 2015-06-19 MED ORDER — VERAPAMIL HCL 2.5 MG/ML IV SOLN
INTRAVENOUS | Status: DC | PRN
  Administered 2015-06-19: 10 mL via INTRA_ARTERIAL

## 2015-06-19 MED ORDER — MORPHINE SULFATE (PF) 2 MG/ML IV SOLN: 2.0000 mg | INTRAVENOUS | Status: DC | PRN

## 2015-06-19 MED ORDER — LIDOCAINE HCL (PF) 1 % IJ SOLN
INTRAMUSCULAR | Status: AC
  Filled 2015-06-19: qty 30

## 2015-06-19 MED ORDER — VERAPAMIL HCL 2.5 MG/ML IV SOLN: INTRA_ARTERIAL | Status: DC | PRN

## 2015-06-19 MED ORDER — MIDAZOLAM HCL 2 MG/2ML IJ SOLN
INTRAMUSCULAR | Status: AC
  Filled 2015-06-19: qty 2

## 2015-06-19 MED ORDER — SODIUM CHLORIDE 0.9 % IJ SOLN: 3.0000 mL | Freq: Two times a day (BID) | INTRAMUSCULAR | Status: DC

## 2015-06-19 MED ORDER — HEPARIN SODIUM (PORCINE) 1000 UNIT/ML IJ SOLN
INTRAMUSCULAR | Status: AC
  Filled 2015-06-19: qty 1

## 2015-06-19 MED ORDER — ACETAMINOPHEN 325 MG PO TABS: 650.0000 mg | ORAL_TABLET | ORAL | Status: DC | PRN

## 2015-06-19 MED ORDER — NITROGLYCERIN 1 MG/10 ML FOR IR/CATH LAB
INTRA_ARTERIAL | Status: AC
  Filled 2015-06-19: qty 10

## 2015-06-19 MED ORDER — VERAPAMIL HCL 2.5 MG/ML IV SOLN
INTRAVENOUS | Status: AC
  Filled 2015-06-19: qty 2

## 2015-06-19 MED ORDER — SODIUM CHLORIDE 0.9 % IV SOLN
INTRAVENOUS | Status: DC
  Administered 2015-06-19: 08:00:00 via INTRAVENOUS
  Administered 2015-06-19: 1000 mL via INTRAVENOUS

## 2015-06-19 MED ORDER — HEPARIN SODIUM (PORCINE) 1000 UNIT/ML IJ SOLN
INTRAMUSCULAR | Status: DC | PRN
  Administered 2015-06-19: 3500 [IU] via INTRAVENOUS

## 2015-06-19 MED ORDER — SODIUM CHLORIDE 0.9 % WEIGHT BASED INFUSION: 3.0000 mL/kg/h | INTRAVENOUS | Status: DC

## 2015-06-19 MED ORDER — SODIUM CHLORIDE 0.9 % IV SOLN: 250.0000 mL | INTRAVENOUS | Status: DC | PRN

## 2015-06-19 MED ORDER — MIDAZOLAM HCL 2 MG/2ML IJ SOLN
INTRAMUSCULAR | Status: DC | PRN
  Administered 2015-06-19 (×2): 1 mg via INTRAVENOUS

## 2015-06-19 MED ORDER — LIDOCAINE HCL (PF) 1 % IJ SOLN
INTRAMUSCULAR | Status: DC | PRN
  Administered 2015-06-19: 5 mL

## 2015-06-19 MED ORDER — IOHEXOL 350 MG/ML SOLN
INTRAVENOUS | Status: DC | PRN
  Administered 2015-06-19: 90 mL via INTRA_ARTERIAL

## 2015-06-19 MED ORDER — FENTANYL CITRATE (PF) 100 MCG/2ML IJ SOLN
INTRAMUSCULAR | Status: DC | PRN
  Administered 2015-06-19 (×2): 25 ug via INTRAVENOUS

## 2015-06-19 MED ORDER — ONDANSETRON HCL 4 MG/2ML IJ SOLN: 4.0000 mg | Freq: Four times a day (QID) | INTRAMUSCULAR | Status: DC | PRN

## 2015-06-19 MED ORDER — SODIUM CHLORIDE 0.9 % IJ SOLN: 3.0000 mL | INTRAMUSCULAR | Status: DC | PRN

## 2015-06-19 MED ORDER — HEPARIN (PORCINE) IN NACL 2-0.9 UNIT/ML-% IJ SOLN
INTRAMUSCULAR | Status: AC
  Filled 2015-06-19: qty 1000

## 2015-06-19 MED ORDER — FENTANYL CITRATE (PF) 100 MCG/2ML IJ SOLN
INTRAMUSCULAR | Status: AC
  Filled 2015-06-19: qty 2

## 2015-06-19 SURGICAL SUPPLY — 10 items
CATH INFINITI 5FR ANG PIGTAIL (CATHETERS) ×2 IMPLANT
CATH OPTITORQUE TIG 4.0 5F (CATHETERS) ×2 IMPLANT
DEVICE RAD COMP TR BAND LRG (VASCULAR PRODUCTS) ×2 IMPLANT
GLIDESHEATH SLEND A-KIT 6F 22G (SHEATH) ×2 IMPLANT
KIT HEART LEFT (KITS) ×2 IMPLANT
PACK CARDIAC CATHETERIZATION (CUSTOM PROCEDURE TRAY) ×2 IMPLANT
SYR MEDRAD MARK V 150ML (SYRINGE) ×2 IMPLANT
TRANSDUCER W/STOPCOCK (MISCELLANEOUS) ×2 IMPLANT
TUBING CIL FLEX 10 FLL-RA (TUBING) ×2 IMPLANT
WIRE SAFE-T 1.5MM-J .035X260CM (WIRE) ×2 IMPLANT

## 2015-06-19 NOTE — Interval H&P Note (Signed)
History and Physical Interval Note:  06/19/2015 7:26 AM  [name]  has presented today for surgery, with the diagnosis of chest pain with Abnormal Echocardiogram.   The various methods of treatment have been discussed with the patient and family. After consideration of risks, benefits and other options for treatment, the patient has consented to  Procedure(s): Left Heart Cath and Coronary Angiography (N/A) with possible percutaneous coronary intervention  as a surgical intervention .  [name] history has been reviewed, patient examined, no change in status, stable for surgery.  I have reviewed the patient's chart and labs.  Questions were answered to the patient's satisfaction.     HARDING, DAVID W  AUC for Cath: CAD Assessment (Coronary Angiography With or Without Left Heart Catheterization and/or Left Ventriculography)  Patient Information:    Suspected CAD (No Prior PCI, No Prior CABG, and No Prior Angiogram Showing >= 50% Angiographic Stenosis)   Prior Noninvasive Testing: ECG Stress Testing   Intermediate-risk findings (e.g., Duke treadmill score 4 to -10)   Pretest Symptom Status:Symptomatic  AUC Score:   U (6)   Indication:   12 -- Not taking into consideration regional WMA on Echo & NSVT on Holter.  AUC for Intervention to be done pending Dx Cath findings.  HARDING, Leonie Green, MD  Cath Lab Visit (complete for each Cath Lab visit)  Clinical Evaluation Leading to the Procedure:   ACS: No.  Non-ACS:    Anginal Classification: CCS III  Anti-ischemic medical therapy: Minimal Therapy (1 class of medications)  Non-Invasive Test Results: Equivocal test results - normal ETT, but WMA on Echo &  NSVT on Holter.  Prior CABG: No previous CABG

## 2015-06-19 NOTE — Discharge Instructions (Signed)
Radial Site Care °Refer to this sheet in the next few weeks. These instructions provide you with information about caring for yourself after your procedure. Your health care provider may also give you more specific instructions. Your treatment has been planned according to current medical practices, but problems sometimes occur. Call your health care provider if you have any problems or questions after your procedure. °WHAT TO EXPECT AFTER THE PROCEDURE °After your procedure, it is typical to have the following: °· Bruising at the radial site that usually fades within 1-2 weeks. °· Blood collecting in the tissue (hematoma) that may be painful to the touch. It should usually decrease in size and tenderness within 1-2 weeks. °HOME CARE INSTRUCTIONS °· Take medicines only as directed by your health care provider. °· You may shower 24-48 hours after the procedure or as directed by your health care provider. Remove the bandage (dressing) and gently wash the site with plain soap and water. Pat the area dry with a clean towel. Do not rub the site, because this may cause bleeding. °· Do not take baths, swim, or use a hot tub until your health care provider approves. °· Check your insertion site every day for redness, swelling, or drainage. °· Do not apply powder or lotion to the site. °· Do not flex or bend the affected arm for 24 hours or as directed by your health care provider. °· Do not push or pull heavy objects with the affected arm for 24 hours or as directed by your health care provider. °· Do not lift over 10 lb (4.5 kg) for 5 days after your procedure or as directed by your health care provider. °· Ask your health care provider when it is okay to: °¨ Return to work or school. °¨ Resume usual physical activities or sports. °¨ Resume sexual activity. °· Do not drive home if you are discharged the same day as the procedure. Have someone else drive you. °· You may drive 24 hours after the procedure unless otherwise  instructed by your health care provider. °· Do not operate machinery or power tools for 24 hours after the procedure. °· If your procedure was done as an outpatient procedure, which means that you went home the same day as your procedure, a responsible adult should be with you for the first 24 hours after you arrive home. °· Keep all follow-up visits as directed by your health care provider. This is important. °SEEK MEDICAL CARE IF: °· You have a fever. °· You have chills. °· You have increased bleeding from the radial site. Hold pressure on the site. °SEEK IMMEDIATE MEDICAL CARE IF: °· You have unusual pain at the radial site. °· You have redness, warmth, or swelling at the radial site. °· You have drainage (other than a small amount of blood on the dressing) from the radial site. °· The radial site is bleeding, and the bleeding does not stop after 30 minutes of holding steady pressure on the site. °· Your arm or hand becomes pale, cool, tingly, or numb. °  °This information is not intended to replace advice given to you by your health care provider. Make sure you discuss any questions you have with your health care provider. °  °Document Released: 08/20/2010 Document Revised: 08/08/2014 Document Reviewed: 02/03/2014 °Elsevier Interactive Patient Education ©2016 [employer] ° °

## 2015-06-19 NOTE — H&P (View-Only) (Signed)
Cardiology Office Note   Date:  06/11/2015   ID:  Emily [name], DOB 1961/07/03, MRN [id number]  PCP:  Delman Cheadle, MD  Cardiologist:   Sharol Harness, MD   Chief Complaint  Patient presents with  . Follow-up    mildly abnormal echo// pt c/o licht chest discomfort and SOB/wheezing     Patient ID: Emily [name] is a 54 y.o. female with hyperlipidemia and family history of premature CAD here for follow up on chest pain, shortness of breath and palpitations.  Fore the last couple of day she has been feeling tired.  She has some chest tightness now  She had some wheezingduring some family stress.  No LE edema  L foot pain Coughing today  Stacking pillows at least 2 pillows Wakes up coughing  She hasn't increased her exercise.  She take care of her 2 grandchildren each day.  10 mo and 2 years.  Extra steps  Lipids pending   History of Present Illness hese symptoms have been ongoing for several months. She reports chest congestion and heaviness that occurs almost daily. She cannot associated with any particular activity. It occurs both at rest and with exertion. The symptoms last up to 2 days at a time and INR of 3-4 out of 10 in severity. There is no radiation occurs substernally. There is associated shortness of breath, nausea, and diaphoresis, though she is unable to determine if it's worse when she's having chest pressure. She notes that she is also perimenopausal and is unsure whether the symptoms may be related to this.  [name] denies lower extremity edema, orthopnea, or PND. She does note palpitations and that her resting heart rate has been over 100 at times. This is different, as in the past her heart rate was in the 70s to 80's. This occurs almost daily and is associated with chest pressure and lightheadedness. She denies syncope. [name] also notes extreme fatigue and feeling as though she needs to lay down at the end of the day. These symptoms have improved somewhat  over the last month, but still persist.  [name] was evaluated by her PCP, Dr. Laney Pastor, on 03/23/15. At that appointment ECG showed sinus rhythm with borderline short PR interval.  Basic metabolic panel, CBC, and thyroid function were within normal limits. She was noted to have a elevated sedimentation rate at 60.  Dr. Laney Pastor recommended that she take allergy and stomach medicine, which helped somewhat, but did not alleviate the symptoms completely.  [name] mother died of a myocardial infarction at age 31. Her sister had peripartum cardiopathy at age 24.  She does not get any formal exercise but she cares for her 63 and 22 month old grandchildren all day.  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  She cooks most of her meals but notes that her diet could be better.  She is trying to increase her fiber and vegetable intake.  She drinks water, unsweetened tea and one cup of coffee daily.    Past Medical History  Diagnosis Date  . GERD (gastroesophageal reflux disease)     Past Surgical History  Procedure Laterality Date  . Tonsillectomy      when she was 3     Current Outpatient Prescriptions  Medication Sig Dispense Refill  . acetaminophen (TYLENOL) 500 MG tablet Take 500 mg by mouth every 6 (six) hours as needed for moderate pain.    Marland Kitchen aspirin 81 MG tablet Take  81 mg by mouth daily.    . cholecalciferol (VITAMIN D) 1000 UNITS tablet Take 1,000 Units by mouth daily.    Marland Kitchen ibuprofen (ADVIL,MOTRIN) 200 MG tablet Take 200 mg by mouth every 6 (six) hours as needed for mild pain.    . Multiple Vitamins-Minerals (MULTIVITAMIN WITH MINERALS) tablet Take 1 tablet by mouth daily.    . nitroGLYCERIN (NITROSTAT) 0.4 MG SL tablet Place 1 tablet (0.4 mg total) under the tongue every 5 (five) minutes as needed for chest pain. 25 tablet 6  . omeprazole (PRILOSEC) 40 MG capsule Take 1 capsule (40 mg total) by mouth daily. 90 capsule 3  . thiamine (VITAMIN B-1) 100 MG tablet Take 100 mg by  mouth daily.     No current facility-administered medications for this visit.    Allergies:   Hornet venom and Tetanus toxoids    Social History:  The patient  reports that she has never smoked. She has never used smokeless tobacco. She reports that she does not drink alcohol or use illicit drugs.   Family History:  [name] family history includes COPD in her father and mother; Cancer in her mother and paternal grandmother; Diabetes in her father and sister; Emphysema in her mother; [name]' disease in her sister; Heart disease in her brother, father, mother, and sister; Heart failure (age of onset: 72) in her sister; Hyperlipidemia in her brother, father, and sister; Hypertension in her father; Prostate cancer in her paternal grandfather; Skin cancer in her father; Vision loss in her father.    ROS:  Please see the history of present illness.   Otherwise, review of systems are positive for none.   All other systems are reviewed and negative.    PHYSICAL EXAM: VS:  BP 135/87 mmHg  Pulse 89  Ht 4' 11.5" (1.511 m)  Wt 69.31 kg (152 lb 12.8 oz)  BMI 30.36 kg/m2 , BMI Body mass index is 30.36 kg/(m^2). GENERAL:  Well appearing HEENT:  Pupils equal round and reactive, fundi not visualized, oral mucosa unremarkable NECK:  No jugular venous distention, waveform within normal limits, carotid upstroke brisk and symmetric, no bruits, no thyromegaly LYMPHATICS:  No cervical adenopathy LUNGS:  Clear to auscultation bilaterally HEART:  RRR.  PMI not displaced or sustained,S1 and S2 within normal limits, no S3, no S4, no clicks, no rubs, no murmurs ABD:  Flat, positive bowel sounds normal in frequency in pitch, no bruits, no rebound, no guarding, no midline pulsatile mass, no hepatomegaly, no splenomegaly EXT:  2 plus pulses throughout, no edema, no cyanosis no clubbing SKIN:  No rashes no nodules NEURO:  Cranial nerves II through XII grossly intact, motor grossly intact throughout PSYCH:   Cognitively intact, oriented to person place and time    EKG:  EKG is ordered today. The ekg ordered today demonstrates sinus rhythm at 91 bpm.   Recent Labs: 07/04/2014: Platelets 260 03/23/2015: ALT 21; BUN 19; Creat 0.70; Hemoglobin 14.5; Potassium 5.1; Sodium 142; TSH 1.103    Lipid Panel    Component Value Date/Time   CHOL 221* 06/11/2015 1054   TRIG 144 06/11/2015 1054   HDL 53 06/11/2015 1054   CHOLHDL 4.2 06/11/2015 1054   VLDL 29 06/11/2015 1054   LDLCALC 139* 06/11/2015 1054   LDLDIRECT 129* 07/27/2012 0809      Wt Readings from Last 3 Encounters:  06/11/15 69.31 kg (152 lb 12.8 oz)  06/11/15 68.947 kg (152 lb)  05/25/15 69.4 kg (153 lb)  Other studies Reviewed: Additional studies/ records that were reviewed today include: . Review of the above records demonstrates:  Please see elsewhere in the note.     ASSESSMENT AND PLAN:  # Chest pain concerning for angina: Ms. was Casavant's chest pain is atypical.  However, her Holter showed frequent PVCs and short runs of NSVT. Her treadmill stress test was negative for ischemia. However her echo showed inferior and inferolateral hypokinesis with this constellation of findings, we'll pursue cardiac catheterization to rule out obstructive coronary disease. Reviewed the option of cardiac CT angiography. However she would prefer a more definitive and possibly therapeutic strategy.    # CV Disease Prevention: Her ASCVD 10 year risk is 2%.  Therefore, we do not recommend statin therapy at this time.  This could change pending the results of her cardiac catheterization.  # PVCs: Heart catheterization pending as above.  Thyroid function, CBC and basic metabolic panel have been unremarkable.  # Chronic diastolic dysfunction: Ms. Scantlebury echo revealed grade 1 diastolic dysfunction.  She may benefit from thank you lowering her heart rate to improve ventricular filling. Hopefully this will also improve her shortness of breath.     Current medicines are reviewed at length with the patient today.  The patient does not have concerns regarding medicines.  The following changes have been made: metoprolol 12.5 mg every 12 hours.   Labs/ tests ordered today include:   No orders of the defined types were placed in this encounter.     Disposition:   FU with Amiel Mccaffrey C. Oval Linsey, MD  after cardiac catheterization   Signed, Sharol Harness, MD  06/11/2015 4:35 PM    [place]

## 2015-06-19 NOTE — Research (Signed)
CADLAD Informed Consent   Subject Name: Emily [name]  Subject met inclusion and exclusion criteria.  The informed consent form, study requirements and expectations were reviewed with the subject and questions and concerns were addressed prior to the signing of the consent form.  The subject verbalized understanding of the trial requirements.  The subject agreed to participate in the CADLAD trial and signed the informed consent.  The informed consent was obtained prior to performance of any protocol-specific procedures for the subject.  A copy of the signed informed consent was given to the subject and a copy was placed in the subject's medical record.  Hugh Pruitt Jr. 06/19/2015 0735 am 

## 2015-06-24 LAB — IFOBT (OCCULT BLOOD): IFOBT: POSITIVE

## 2015-07-02 ENCOUNTER — Encounter: Payer: Self-pay | Admitting: Family Medicine

## 2015-07-06 ENCOUNTER — Telehealth: Payer: Self-pay | Admitting: Cardiovascular Disease

## 2015-07-06 NOTE — Telephone Encounter (Signed)
Pt had procedure on 06-19-15. She wants to know if she need a follow up visit or what is next please?

## 2015-07-06 NOTE — Telephone Encounter (Signed)
Pt had procedure on 06-19-15. She wants to know if she needs a follow up visit or what is next please?

## 2015-07-06 NOTE — Telephone Encounter (Signed)
Called and spoke to patient. Emily [name] had essentially normal cath on 11/18 by Dr. Ellyn Hack but noted Emily [name] had not received any f/u instructions or appt notifications - does Emily [name] need to be seen back here & if so how soon?  Emily [name] states still having occasional shortness of breath on exertion as had been noted prior to cath. O/w no complaints.  Routed to Dr. Oval Linsey to advise.

## 2015-07-07 NOTE — Telephone Encounter (Signed)
Cath was normal.  6 month follow up.

## 2015-07-09 NOTE — Telephone Encounter (Signed)
Spoke to patient .information given Recall appointment for 6 month placed

## 2015-09-07 ENCOUNTER — Encounter: Payer: Self-pay | Admitting: Gastroenterology

## 2015-09-07 ENCOUNTER — Ambulatory Visit (INDEPENDENT_AMBULATORY_CARE_PROVIDER_SITE_OTHER): Payer: 59 | Admitting: Gastroenterology

## 2015-09-07 VITALS — BP 110/68 | HR 84 | Ht 59.5 in | Wt 155.0 lb

## 2015-09-07 DIAGNOSIS — K921 Melena: Secondary | ICD-10-CM

## 2015-09-07 NOTE — Progress Notes (Signed)
HPI: This is a   very pleasant 55 year old woman whom I last saw for 5 months ago.  Chief complaint is Hemoccult-positive stool  I saw her 4 or 5 months ago in the office for cough. There was a question of GERD was contributing however her classic GERD symptoms were relatively minor. I recommended she change to proton pump inhibitor once daily and that she follow-up in several months. She start a proton pump inhibitor and her rare intermittent classic GERD symptoms have completely resolved and her cough has improved significantly as well.  Since then she has found to have Hemoccult-positive stool on annual screening exam.  She has hemorrhoids that she is bothered by intermittently. Sees no significant overt GI bleeding however. Her weight has been stable.    Past Medical History  Diagnosis Date  . GERD (gastroesophageal reflux disease)   . Chronic diastolic heart failure ([place]) 06/11/2015  . PVC's (premature ventricular contractions) 06/11/2015  . NSVT (nonsustained ventricular tachycardia) ([place]) 06/13/2015    Past Surgical History  Procedure Laterality Date  . Tonsillectomy      when she was 3  . Cardiac catheterization N/A 06/19/2015    Procedure: Left Heart Cath and Coronary Angiography;  Surgeon: Leonie Man, MD;  Location: [facility];  Service: Cardiovascular;  Laterality: N/A;    Current Outpatient Prescriptions  Medication Sig Dispense Refill  . acetaminophen (TYLENOL) 500 MG tablet Take 500 mg by mouth every 6 (six) hours as needed for moderate pain.    Marland Kitchen aspirin 81 MG tablet Take 81 mg by mouth daily.    . cholecalciferol (VITAMIN D) 1000 UNITS tablet Take 1,000 Units by mouth daily.    Marland Kitchen ibuprofen (ADVIL,MOTRIN) 200 MG tablet Take 200 mg by mouth every 6 (six) hours as needed for mild pain.    . metoprolol tartrate (LOPRESSOR) 25 MG tablet Take 0.5 tablets (12.5 mg total) by mouth 2 (two) times daily. 90 tablet 3  . Multiple Vitamins-Minerals (MULTIVITAMIN WITH  MINERALS) tablet Take 1 tablet by mouth daily.    . nitroGLYCERIN (NITROSTAT) 0.4 MG SL tablet Place 1 tablet (0.4 mg total) under the tongue every 5 (five) minutes as needed for chest pain. 25 tablet 6  . omeprazole (PRILOSEC) 40 MG capsule Take 1 capsule (40 mg total) by mouth daily. 90 capsule 3  . thiamine (VITAMIN B-1) 100 MG tablet Take 100 mg by mouth daily.     No current facility-administered medications for this visit.    Allergies as of 09/07/2015 - Review Complete 09/07/2015  Allergen Reaction Noted  . Hornet venom Swelling 06/11/2015  . Tetanus toxoids Other (See Comments) 07/27/2012    Family History  Problem Relation Age of Onset  . Heart disease Mother   . COPD Mother   . Emphysema Mother   . Heart disease Father   . Diabetes Father   . COPD Father   . Vision loss Father   . Skin cancer Father     skin  . Hyperlipidemia Father   . Hypertension Father   . Heart disease Sister   . Graves' disease Sister   . Diabetes Sister   . Hyperlipidemia Sister   . Heart failure Sister 28  . Heart disease Brother   . Hyperlipidemia Brother   . Cancer Paternal Grandmother     unknown  . Prostate cancer Paternal Grandfather     Prostate cancer  . Cancer Mother     oral    Social History  Social History  . Marital Status: Married    Spouse Name: N/A  . Number of Children: 5  . Years of Education: N/A   Occupational History  . mother    Social History Main Topics  . Smoking status: Never Smoker   . Smokeless tobacco: Never Used  . Alcohol Use: No  . Drug Use: No  . Sexual Activity: Yes   Other Topics Concern  . Not on file   Social History Narrative   Married.  Education: [employer].           Physical Exam: BP 110/68 mmHg  Pulse 84  Ht 4' 11.5" (1.511 m)  Wt 155 lb (70.308 kg)  BMI 30.79 kg/m2 Constitutional: generally well-appearing Psychiatric: alert and oriented x3 Abdomen: soft, nontender, nondistended, no obvious ascites, no peritoneal  signs, normal bowel sounds   Assessment and plan: 55 y.o. female with Hemoccult-positive stool  I recommended we proceed with colonoscopy at her soonest convenience she agreed and wishes to proceed.   Owens Loffler, MD [facility] 09/07/2015, 11:03 AM

## 2015-09-07 NOTE — Patient Instructions (Addendum)
You will be set up for a colonoscopy for hemocult positive stool ([place]). Continue on omeprazole 40mg  once daily.

## 2015-09-23 ENCOUNTER — Telehealth: Payer: Self-pay | Admitting: Cardiovascular Disease

## 2015-09-23 MED ORDER — METOPROLOL TARTRATE 25 MG PO TABS: 12.5000 mg | ORAL_TABLET | Freq: Two times a day (BID) | ORAL | Status: DC

## 2015-09-23 NOTE — Telephone Encounter (Signed)
Refills sent

## 2015-09-23 NOTE — Telephone Encounter (Signed)
New Message   *STAT* If patient is at the pharmacy, call can be transferred to refill team.   1. Which medications need to be refilled? (please list name of each medication and dose if known) metoprolol tartrate (LOPRESSOR) 25 MG tablet   2. Which pharmacy/location (including street and city if local pharmacy) is medication to be sent to? [facility]  3. Do they need a 30 day or 90 day supply? 30 day supply   Comments: Pt has follow up scheduled on 12/07/2015 With Oval Linsey

## 2015-10-12 ENCOUNTER — Telehealth: Payer: Self-pay | Admitting: Gastroenterology

## 2015-10-12 MED ORDER — NA SULFATE-K SULFATE-MG SULF 17.5-3.13-1.6 GM/177ML PO SOLN: 1.0000 | Freq: Once | ORAL | Status: DC

## 2015-10-12 NOTE — Telephone Encounter (Signed)
Prescription has been sent as requested.

## 2015-10-26 ENCOUNTER — Ambulatory Visit (AMBULATORY_SURGERY_CENTER): Payer: 59 | Admitting: Gastroenterology

## 2015-10-26 ENCOUNTER — Encounter: Payer: Self-pay | Admitting: Gastroenterology

## 2015-10-26 VITALS — BP 102/62 | HR 76 | Temp 97.8°F | Resp 12 | Ht 59.0 in | Wt 155.0 lb

## 2015-10-26 DIAGNOSIS — D123 Benign neoplasm of transverse colon: Secondary | ICD-10-CM

## 2015-10-26 DIAGNOSIS — D120 Benign neoplasm of cecum: Secondary | ICD-10-CM

## 2015-10-26 DIAGNOSIS — R195 Other fecal abnormalities: Secondary | ICD-10-CM

## 2015-10-26 DIAGNOSIS — K921 Melena: Secondary | ICD-10-CM

## 2015-10-26 MED ORDER — SODIUM CHLORIDE 0.9 % IV SOLN: 500.0000 mL | INTRAVENOUS | Status: DC

## 2015-10-26 NOTE — Op Note (Signed)
[facility] [place] Patient Name: Emily [name] Procedure Date: 10/26/2015 7:51 AM MRN: [id number] Endoscopist: Milus Banister , MD Age: 55 Referring MD:  Date of Birth: March 20, 1961 Gender: Female Procedure:                Colonoscopy Indications:              Heme positive stool Medicines:                Monitored Anesthesia Care Procedure:                Pre-Anesthesia Assessment:                           - Prior to the procedure, a History and Physical                            was performed, and patient medications and                            allergies were reviewed. [name] tolerance of                            previous anesthesia was also reviewed. The risks                            and benefits of the procedure and the sedation                            options and risks were discussed with the patient.                            All questions were answered, and informed consent                            was obtained. Prior Anticoagulants: The patient has                            taken no previous anticoagulant or antiplatelet                            agents. ASA Grade Assessment: II - A patient with                            mild systemic disease. After reviewing the risks                            and benefits, the patient was deemed in                            satisfactory condition to undergo the procedure.                           After obtaining informed consent, the colonoscope  was passed under direct vision. Throughout the                            procedure, the patient's blood pressure, pulse, and                            oxygen saturations were monitored continuously. The                            Model CF-HQ190L [id number]) scope was introduced                            through the anus and advanced to the the cecum,                            identified by appendiceal orifice and ileocecal            valve. The colonoscopy was performed without                            difficulty. The patient tolerated the procedure                            well. The quality of the bowel preparation was                            excellent. The ileocecal valve, appendiceal                            orifice, and rectum were photographed. Scope In: 8:02:33 AM Scope Out: 8:14:40 AM Scope Withdrawal Time: 0 hours 8 minutes 58 seconds  Total Procedure Duration: 0 hours 12 minutes 7 seconds  Findings:      Two sessile polyps were found in the descending colon and cecum. The       polyps were 2 to 5 mm in size. These polyps were removed with a cold       snare. Resection and retrieval were complete.      The entire examined colon appeared normal on direct and retroflexion       views. Complications:            No immediate complications. Estimated Blood Loss:     Estimated blood loss: none. Impression:               - Two 2 to 5 mm polyps in the descending colon and                            in the cecum, removed with a cold snare. Resected                            and retrieved.                           - The entire examined colon is normal on direct and  retroflexion views. Recommendation:           - Patient has a contact number available for                            emergencies. The signs and symptoms of potential                            delayed complications were discussed with the                            patient. Return to normal activities tomorrow.                            Written discharge instructions were provided to the                            patient.                           - Resume previous diet.                           - Continue present medications.                           You will receive a letter within 2-3 weeks with the                            pathology results and my final recommendations.                           If  the polyp(s) is proven to be 'pre-cancerous' on                            pathology, you will need repeat colonoscopy in 5                            years. If the polyp(s) is NOT 'precancerous' on                            pathology then you should repeat colon cancer                            screening in 10 years with colonoscopy without need                            for colon cancer screening by any method prior to                            then (including stool testing). Procedure Code(s):        --- Professional ---                           [phone], Colonoscopy, flexible; with removal of  tumor(s), polyp(s), or other lesion(s) by snare                            technique CPT copyright 2016 American Medical Association. All rights reserved. Milus Banister, MD 10/26/2015 8:17:22 AM This report has been signed electronically. Number of Addenda: 0 Referring MD:      Georgianne Fick And Louisa Second

## 2015-10-26 NOTE — Progress Notes (Signed)
To PACU  Awake and alert.  Report to RN 

## 2015-10-26 NOTE — Progress Notes (Signed)
Called to room to assist during endoscopic procedure.  Patient ID and intended procedure confirmed with present staff. Received instructions for my participation in the procedure from the performing physician.  

## 2015-10-26 NOTE — Patient Instructions (Signed)
YOU HAD AN ENDOSCOPIC PROCEDURE TODAY AT THE [facility]:   Refer to the procedure report that was given to you for any specific questions about what was found during the examination.  If the procedure report does not answer your questions, please call your gastroenterologist to clarify.  If you requested that your care partner not be given the details of your procedure findings, then the procedure report has been included in a sealed envelope for you to review at your convenience later.  YOU SHOULD EXPECT: Some feelings of bloating in the abdomen. Passage of more gas than usual.  Walking can help get rid of the air that was put into your GI tract during the procedure and reduce the bloating. If you had a lower endoscopy (such as a colonoscopy or flexible sigmoidoscopy) you may notice spotting of blood in your stool or on the toilet paper. If you underwent a bowel prep for your procedure, you may not have a normal bowel movement for a few days.  Please Note:  You might notice some irritation and congestion in your nose or some drainage.  This is from the oxygen used during your procedure.  There is no need for concern and it should clear up in a day or so.  SYMPTOMS TO REPORT IMMEDIATELY:   Following lower endoscopy (colonoscopy or flexible sigmoidoscopy):  Excessive amounts of blood in the stool  Significant tenderness or worsening of abdominal pains  Swelling of the abdomen that is new, acute  Fever of 100F or higher   For urgent or emergent issues, a gastroenterologist can be reached at any hour by calling [phone].   DIET: Your first meal following the procedure should be a small meal and then it is ok to progress to your normal diet. Heavy or fried foods are harder to digest and may make you feel nauseous or bloated.  Likewise, meals heavy in dairy and vegetables can increase bloating.  Drink plenty of fluids but you should avoid alcoholic beverages for 24  hours.  ACTIVITY:  You should plan to take it easy for the rest of today and you should NOT DRIVE or use heavy machinery until tomorrow (because of the sedation medicines used during the test).    FOLLOW UP: Our staff will call the number listed on your records the next business day following your procedure to check on you and address any questions or concerns that you may have regarding the information given to you following your procedure. If we do not reach you, we will leave a message.  However, if you are feeling well and you are not experiencing any problems, there is no need to return our call.  We will assume that you have returned to your regular daily activities without incident.  If any biopsies were taken you will be contacted by phone or by letter within the next 1-3 weeks.  Please call us at [phone] if you have not heard about the biopsies in 3 weeks.    SIGNATURES/CONFIDENTIALITY: You and/or your care partner have signed paperwork which will be entered into your electronic medical record.  These signatures attest to the fact that that the information above on your After Visit Summary has been reviewed and is understood.  Full responsibility of the confidentiality of this discharge information lies with you and/or your care-partner.  Polyps-handout given  Repeat colonoscopy will be determined by pathology.  

## 2015-10-27 ENCOUNTER — Telehealth: Payer: Self-pay

## 2015-10-27 NOTE — Telephone Encounter (Signed)
  Follow up Call-  Call back number 10/26/2015  Post procedure Call Back phone  # [phone]  Permission to leave phone message Yes     Patient questions:  Do you have a fever, pain , or abdominal swelling? No. Pain Score  0 *  Have you tolerated food without any problems? Yes.    Have you been able to return to your normal activities? Yes.    Do you have any questions about your discharge instructions: Diet   No. Medications  No. Follow up visit  No.  Do you have questions or concerns about your Care? No.  Actions: * If pain score is 4 or above: No action needed, pain <4.

## 2015-10-30 ENCOUNTER — Encounter: Payer: Self-pay | Admitting: Gastroenterology

## 2015-12-03 ENCOUNTER — Encounter: Payer: Self-pay | Admitting: Cardiovascular Disease

## 2015-12-03 ENCOUNTER — Ambulatory Visit (INDEPENDENT_AMBULATORY_CARE_PROVIDER_SITE_OTHER): Payer: 59 | Admitting: Cardiovascular Disease

## 2015-12-03 VITALS — BP 128/72 | HR 82 | Ht 59.5 in | Wt 156.7 lb

## 2015-12-03 DIAGNOSIS — I493 Ventricular premature depolarization: Secondary | ICD-10-CM

## 2015-12-03 DIAGNOSIS — R072 Precordial pain: Secondary | ICD-10-CM | POA: Diagnosis not present

## 2015-12-03 MED ORDER — PROPAFENONE HCL 150 MG PO TABS: 150.0000 mg | ORAL_TABLET | Freq: Three times a day (TID) | ORAL | Status: DC

## 2015-12-03 NOTE — Patient Instructions (Addendum)
Please go to our lab on the first floor for a basic metabolic panel and magnesium level.  Stop metoprolol and start propafenone 150 mg by mouth every 8 hours.  Your physician recommends that you schedule a follow-up appointment in: 1 month ov

## 2015-12-03 NOTE — Progress Notes (Signed)
Cardiology Office Note   Date:  12/03/2015   ID:  Emily [name], DOB [birth year], MRN [id number]  PCP:  Emily Cheadle, MD  Cardiologist:   Emily Latch, MD   Chief Complaint  Patient presents with  . Follow-up    6 months  pt c/o a couple episodes of chest discomfort--tightness once, and second time was twinge--towards left in middle of chest; occasional SOB; occasional dizziness when changing positions; occasional "puffiness" between toes  . Depression    pt states she has been feeling unusual--frequent crying, extra sensitivity, mood changes    Patient ID: Emily [name] is a 55 y.o. female with hyperlipidemia and family history of premature CAD here for follow up.  Emily [name] was initially seen 05/2015 for chest pain, shortness of breath and palpitations.  She had an ETT on 05/15/15 that was negative for ischemia.  Echo showed LVEF 55-60% with probably hypokinesis of the basal-mid inferolateral and inferior myocardium and grade 1 diastolic dysfunction. She subsequently underwent LHC, which showed no CAD.  Emily [name] also wore a 48 hour Holter that showed frequent PVCs and occasional PACs.  She was started on metoprolol.  Since then she notes that her heart rate fluctuates irradically.  She has a heart monitor that shows that her heart ranges from 80-120 and is mostly around 100 bpm.  She also reports feeling very depressed lately.  Nothing has changed in her life lately but she has been very tearful and sad.  These symptoms started after starting metoprolol.  She does not get any formal exercise but she cares for her two young grandchildren all day.  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    Past Medical History  Diagnosis Date  . GERD (gastroesophageal reflux disease)   . Chronic diastolic heart failure ([place]) 06/11/2015  . PVC's (premature ventricular contractions) 06/11/2015  . NSVT (nonsustained ventricular tachycardia) ([place]) 06/13/2015    Past Surgical History  Procedure  Laterality Date  . Tonsillectomy      when she was 3  . Cardiac catheterization N/A 06/19/2015    Procedure: Left Heart Cath and Coronary Angiography;  Surgeon: Leonie Man, MD;  Location: [facility];  Service: Cardiovascular;  Laterality: N/A;     Current Outpatient Prescriptions  Medication Sig Dispense Refill  . acetaminophen (TYLENOL) 500 MG tablet Take 500 mg by mouth every 6 (six) hours as needed for moderate pain.    Marland Kitchen aspirin 81 MG tablet Take 81 mg by mouth daily.    . cholecalciferol (VITAMIN D) 1000 UNITS tablet Take 1,000 Units by mouth daily.    Marland Kitchen ibuprofen (ADVIL,MOTRIN) 200 MG tablet Take 200 mg by mouth every 6 (six) hours as needed for mild pain. Reported on 10/26/2015    . metoprolol tartrate (LOPRESSOR) 25 MG tablet Take 0.5 tablets (12.5 mg total) by mouth 2 (two) times daily. 90 tablet 1  . Multiple Vitamins-Minerals (MULTIVITAMIN WITH MINERALS) tablet Take 1 tablet by mouth daily.    . nitroGLYCERIN (NITROSTAT) 0.4 MG SL tablet Place 1 tablet (0.4 mg total) under the tongue every 5 (five) minutes as needed for chest pain. 25 tablet 6  . omeprazole (PRILOSEC) 40 MG capsule Take 1 capsule (40 mg total) by mouth daily. 90 capsule 3  . thiamine (VITAMIN B-1) 100 MG tablet Take 100 mg by mouth daily.     No current facility-administered medications for this visit.    Allergies:   Hornet venom and Tetanus toxoids  Social History:  The patient  reports that she has never smoked. She has never used smokeless tobacco. She reports that she does not drink alcohol or use illicit drugs.   Family History:  [name] family history includes COPD in her father and mother; Cancer in her mother and paternal grandmother; Diabetes in her father and sister; Emphysema in her mother; [name]' disease in her sister; Heart disease in her brother, father, mother, and sister; Heart failure (age of onset: 73) in her sister; Hyperlipidemia in her brother, father, and sister;  Hypertension in her father; Prostate cancer in her paternal grandfather; Skin cancer in her father; Vision loss in her father.    ROS:  Please see the history of present illness.   Otherwise, review of systems are positive for none.   All other systems are reviewed and negative.    PHYSICAL EXAM: VS:  BP 128/72 mmHg  Pulse 82  Ht 4' 11.5" (1.511 m)  Wt 71.079 kg (156 lb 11.2 oz)  BMI 31.13 kg/m2  LMP 10/14/2014 , BMI Body mass index is 31.13 kg/(m^2). GENERAL:  Well appearing HEENT:  Pupils equal round and reactive, fundi not visualized, oral mucosa unremarkable NECK:  No jugular venous distention, waveform within normal limits, carotid upstroke brisk and symmetric, no bruits LYMPHATICS:  No cervical adenopathy LUNGS:  Clear to auscultation bilaterally HEART:  RRR.  PMI not displaced or sustained,S1 and S2 within normal limits, no S3, no S4, no clicks, no rubs, no murmurs ABD:  Flat, positive bowel sounds normal in frequency in pitch, no bruits, no rebound, no guarding, no midline pulsatile mass, no hepatomegaly, no splenomegaly EXT:  2 plus pulses throughout, no edema, no cyanosis no clubbing SKIN:  No rashes no nodules NEURO:  Cranial nerves II through XII grossly intact, motor grossly intact throughout PSYCH:  Cognitively intact, oriented to person place and time   EKG:  EKG is ordered today. The ekg ordered today demonstrates sinus rhythm at 89 bpm.  Echo 05/27/15: Study Conclusions  - Left ventricle: The cavity size was mildly dilated. Systolic  function was normal. The estimated ejection fraction was in the  range of 55% to 60%. Probable hypokinesis of the  basal-midinferolateral and inferior myocardium. There was an  increased relative contribution of atrial contraction to  ventricular filling. Doppler parameters are consistent with  abnormal left ventricular relaxation (grade 1 diastolic  dysfunction). - Aortic valve: Trileaflet; normal thickness, mildly  calcified  leaflets. - Mitral valve: There was mild regurgitation.  ETT 05/15/15: Negative for ischemia.  LHC 06/19/15: 1. Angiographically normal coronary arteries 2. There is possible mild left ventricular systolic dysfunction - difficult to assess due to ectopy  48 hour Holter 05/04/15:  Quality: Fair. Baseline artifact. Predominant rhythm: sinus Average heart rate: 92 bpm Max heart rate: 141 bpm Min heart rate: 59 bpm Pauses >2.5 seconds: 0 Ventricular ectopics: 1651 (1616 isolated, 4 couplets, 5 runs of NSVT, longest run 6 beats)  Percentage of ectopic beats: 0.65% Morphology: polymorphic Supraventricular ectopics: 18 Patient did not submit a symptom diary but triggered the monitor for one event, at which time sinus rhythm at 79 bpm was recorded.   Recent Labs: 03/23/2015: ALT 21; TSH 1.103 06/15/2015: BUN 14; Creat 0.58; Hemoglobin 13.3; Platelets 269; Potassium 4.2; Sodium 141    Lipid Panel    Component Value Date/Time   CHOL 221* 06/11/2015 1054   TRIG 144 06/11/2015 1054   HDL 53 06/11/2015 1054   CHOLHDL 4.2 06/11/2015 1054  VLDL 29 06/11/2015 1054   LDLCALC 139* 06/11/2015 1054   LDLDIRECT 129* 07/27/2012 0809      Wt Readings from Last 3 Encounters:  12/03/15 71.079 kg (156 lb 11.2 oz)  10/26/15 70.308 kg (155 lb)  09/07/15 70.308 kg (155 lb)      Other studies Reviewed: Additional studies/ records that were reviewed today include: . Review of the above records demonstrates:  Please see elsewhere in the note.     ASSESSMENT AND PLAN:  # Chest pain concerning for angina: Resolved.  Cath was negative for ischemia.  # PVCs: Heart catheterization was negative for ischemia and LVEF is >55%.  She continues to have symptomatic palpitations.  However, her mood has been depressed since starting metoprolol.  We discussed the options of trying a CCB, nebivool or an antiarrhythmic.  She prefers to try an antiarrhythmic so we will start propafenone  150mg  q8h.     Current medicines are reviewed at length with the patient today.  The patient does not have concerns regarding medicines.  The following changes have been made: Stop metoprolol and start propafenone 150 mg q8h.   Labs/ tests ordered today include:   No orders of the defined types were placed in this encounter.     Disposition:   FU with Saralee Bolick C. Oval Linsey, MD in 1 month.   Signed, Emily Latch, MD  12/03/2015 4:30 PM    [place]

## 2015-12-06 ENCOUNTER — Encounter: Payer: Self-pay | Admitting: Cardiovascular Disease

## 2015-12-07 ENCOUNTER — Ambulatory Visit: Payer: 59 | Admitting: Cardiovascular Disease

## 2015-12-08 LAB — BASIC METABOLIC PANEL
BUN: 17 mg/dL (ref 7–25)
CHLORIDE: 104 mmol/L (ref 98–110)
CO2: 29 mmol/L (ref 20–31)
Calcium: 9.7 mg/dL (ref 8.6–10.4)
Creat: 0.77 mg/dL (ref 0.50–1.05)
Glucose, Bld: 162 mg/dL — ABNORMAL HIGH (ref 65–99)
POTASSIUM: 5.1 mmol/L (ref 3.5–5.3)
SODIUM: 140 mmol/L (ref 135–146)

## 2015-12-08 LAB — MAGNESIUM: MAGNESIUM: 2.1 mg/dL (ref 1.5–2.5)

## 2016-01-10 NOTE — Progress Notes (Signed)
Cardiology Office Note   Date:  01/11/2016   ID:  Emily Emily [name], DOB 1961-05-08, MRN [id number]  PCP:  Delman Cheadle, MD  Cardiologist:   Skeet Latch, MD   Chief Complaint  Emily [name] presents with  . Follow-up    1 month  pt c/o mild chest tightness/pressure yesterday on exertion; swelling in hands    Emily [name] ID: Emily Emily [name] is a 55 y.o. female with hyperlipidemia and family history of premature CAD here for follow up.  Emily Emily [name] was initially seen 05/2015 for chest pain, shortness of breath and palpitations.  She had an ETT on 05/15/15 that was negative for ischemia.  Echo showed LVEF 55-60% with probably hypokinesis of Emily basal-mid inferolateral and inferior myocardium and grade 1 diastolic dysfunction. She subsequently underwent LHC, which showed no CAD.  Emily Emily [name] also wore a 48 hour Holter that showed frequent PVCs and occasional PACs.  She was started on metoprolol but reported feeling depressed at her follow up appointment.  Metoprolol was discontinued and she was started on propafenone..  Since stopping metoprolol she feels like her depression has significantly improved.  She continues to have some palpitations but it has improved as well.  Last week she was having in her yard and noted some increased palpitations. Her rate monitor noted that she was transitioned into Emily 160.  Overall it has been much better controlled. She denies any chest pain or shortness of breath. She also has not noted any lower extremity edema, orthopnea or PND. Emily Emily [name] has been very stressed lately. Her brother in law is very ill.  She does not get any formal exercise but she cares for her two young grandchildren all day.  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    Past Medical History  Diagnosis Date  . GERD (gastroesophageal reflux disease)   . Chronic diastolic heart failure ([place]) 06/11/2015  . PVC's (premature ventricular contractions) 06/11/2015  . NSVT (nonsustained ventricular tachycardia)  ([place]) 06/13/2015    Past Surgical History  Procedure Laterality Date  . Tonsillectomy      when she was 3  . Cardiac catheterization N/A 06/19/2015    Procedure: Left Heart Cath and Coronary Angiography;  Surgeon: Leonie Man, MD;  Location: [facility];  Service: Cardiovascular;  Laterality: N/A;     Current Outpatient Prescriptions  Medication Sig Dispense Refill  . acetaminophen (TYLENOL) 500 MG tablet Take 500 mg by mouth every 6 (six) hours as needed for moderate pain.    Marland Kitchen aspirin 81 MG tablet Take 81 mg by mouth daily.    . cholecalciferol (VITAMIN D) 1000 UNITS tablet Take 1,000 Units by mouth daily.    Marland Kitchen ibuprofen (ADVIL,MOTRIN) 200 MG tablet Take 200 mg by mouth every 6 (six) hours as needed for mild pain. Reported on 10/26/2015    . Multiple Vitamins-Minerals (MULTIVITAMIN WITH MINERALS) tablet Take 1 tablet by mouth daily.    . nitroGLYCERIN (NITROSTAT) 0.4 MG SL tablet Place 1 tablet (0.4 mg total) under Emily tongue every 5 (five) minutes as needed for chest pain. 25 tablet 6  . omeprazole (PRILOSEC) 40 MG capsule Take 1 capsule (40 mg total) by mouth daily. 90 capsule 3  . propafenone (RYTHMOL) 150 MG tablet Take 1 tablet (150 mg total) by mouth every 8 (eight) hours. 90 tablet 11  . thiamine (VITAMIN B-1) 100 MG tablet Take 100 mg by mouth daily.     No current facility-administered medications for this visit.  Allergies:   Hornet venom and Tetanus toxoids    Social History:  Emily Emily [name]  reports that she has never smoked. She has never used smokeless tobacco. She reports that she does not drink alcohol or use illicit drugs.   Family History:  Emily Emily [name]'s family history includes COPD in her father and mother; Cancer in her mother and paternal grandmother; Diabetes in her father and sister; Emphysema in her mother; [name]' disease in her sister; Heart disease in her brother, father, mother, and sister; Heart failure (age of onset: 2) in her sister;  Hyperlipidemia in her brother, father, and sister; Hypertension in her father; Prostate cancer in her paternal grandfather; Skin cancer in her father; Vision loss in her father.    ROS:  Please see Emily history of present illness.   Otherwise, review of systems are positive for none.   All other systems are reviewed and negative. Problem Dr. Orene Desanctis  PHYSICAL EXAM: VS:  BP 120/74 mmHg  Pulse 93  Ht 4' 11.5" (1.511 m)  Wt 159 lb 3.2 oz (72.213 kg)  BMI 31.63 kg/m2  LMP 10/14/2014 , BMI Body mass index is 31.63 kg/(m^2). GENERAL:  Well appearing HEENT:  Pupils equal round and reactive, fundi not visualized, oral mucosa unremarkable NECK:  No jugular venous distention, waveform within normal limits, carotid upstroke brisk and symmetric, no bruits LYMPHATICS:  No cervical adenopathy LUNGS:  Clear to auscultation bilaterally HEART:  RRR.  PMI not displaced or sustained,S1 and S2 within normal limits, no S3, no S4, no clicks, no rubs, no murmurs ABD:  Flat, positive bowel sounds normal in frequency in pitch, no bruits, no rebound, no guarding, no midline pulsatile mass, no hepatomegaly, no splenomegaly EXT:  2 plus pulses throughout, no edema, no cyanosis no clubbing SKIN:  No rashes no nodules NEURO:  Cranial nerves II through XII grossly intact, motor grossly intact throughout PSYCH:  Cognitively intact, oriented to person place and time   EKG:  EKG is ordered today. Emily ekg ordered today demonstrates sinus rhythm at 93 bpm.  Echo 05/27/15: Study Conclusions  - Left ventricle: Emily cavity size was mildly dilated. Systolic  function was normal. Emily estimated ejection fraction was in Emily  range of 55% to 60%. Probable hypokinesis of Emily  basal-midinferolateral and inferior myocardium. There was an  increased relative contribution of atrial contraction to  ventricular filling. Doppler parameters are consistent with  abnormal left ventricular relaxation (grade 1 diastolic   dysfunction). - Aortic valve: Trileaflet; normal thickness, mildly calcified  leaflets. - Mitral valve: There was mild regurgitation.  ETT 05/15/15: Negative for ischemia.  LHC 06/19/15: 1. Angiographically normal coronary arteries 2. There is possible mild left ventricular systolic dysfunction - difficult to assess due to ectopy  48 hour Holter 05/04/15:  Quality: Fair. Baseline artifact. Predominant rhythm: sinus Average heart rate: 92 bpm Max heart rate: 141 bpm Min heart rate: 59 bpm Pauses >2.5 seconds: 0 Ventricular ectopics: 1651 (1616 isolated, 4 couplets, 5 runs of NSVT, longest run 6 beats)  Percentage of ectopic beats: 0.65% Morphology: polymorphic Supraventricular ectopics: 18 Emily [name] did not submit a symptom diary but triggered Emily monitor for one event, at which time sinus rhythm at 79 bpm was recorded.   Recent Labs: 03/23/2015: ALT 21; TSH 1.103 06/15/2015: Hemoglobin 13.3; Platelets 269 12/07/2015: BUN 17; Creat 0.77; Magnesium 2.1; Potassium 5.1; Sodium 140    Lipid Panel    Component Value Date/Time   CHOL 221* 06/11/2015 1054   TRIG 144 06/11/2015  1054   HDL 53 06/11/2015 1054   CHOLHDL 4.2 06/11/2015 1054   VLDL 29 06/11/2015 1054   LDLCALC 139* 06/11/2015 1054   LDLDIRECT 129* 07/27/2012 0809      Wt Readings from Last 3 Encounters:  01/11/16 159 lb 3.2 oz (72.213 kg)  12/03/15 156 lb 11.2 oz (71.079 kg)  10/26/15 155 lb (70.308 kg)      Other studies Reviewed: Additional studies/ records that were reviewed today include: . Review of Emily above records demonstrates:  Please see elsewhere in Emily note.     ASSESSMENT AND PLAN:  # Chest pain concerning for angina: Resolved.  Cath was negative for ischemia.  # PVCs: Heart catheterization was negative for ischemia and LVEF is >55%.  Palpitations have improved somewhat on propranolol and she is tolerating it much better than metoprolol.  Current medicines are reviewed at length with  Emily Emily [name] today.  Emily Emily [name] does not have concerns regarding medicines.  Emily following changes have been made: No change  Labs/ tests ordered today include:   Orders Placed This Encounter  Procedures  . EKG 12-Lead    Time spent: 15 minutes-Greater than 50% of this time was spent in counseling, explanation of diagnosis, planning of further management, and coordination of care.   Disposition:   FU with Mellie Buccellato C. Oval Linsey, MD in 6 months   Signed, Skeet Latch, MD  01/11/2016 4:01 PM    [place]

## 2016-01-11 ENCOUNTER — Encounter: Payer: Self-pay | Admitting: Cardiovascular Disease

## 2016-01-11 ENCOUNTER — Ambulatory Visit (INDEPENDENT_AMBULATORY_CARE_PROVIDER_SITE_OTHER): Payer: 59 | Admitting: Cardiovascular Disease

## 2016-01-11 VITALS — BP 120/74 | HR 93 | Ht 59.5 in | Wt 159.2 lb

## 2016-01-11 DIAGNOSIS — R0789 Other chest pain: Secondary | ICD-10-CM | POA: Diagnosis not present

## 2016-01-11 DIAGNOSIS — I493 Ventricular premature depolarization: Secondary | ICD-10-CM | POA: Diagnosis not present

## 2016-01-11 DIAGNOSIS — I2510 Atherosclerotic heart disease of native coronary artery without angina pectoris: Secondary | ICD-10-CM | POA: Diagnosis not present

## 2016-01-11 NOTE — Patient Instructions (Signed)
Your physician wants you to follow-up in: [place] You will receive a reminder letter in the mail two months in advance. If you don't receive a letter, please call our office to schedule the follow-up appointment.

## 2016-05-02 ENCOUNTER — Other Ambulatory Visit: Payer: Self-pay | Admitting: Family Medicine

## 2016-05-02 DIAGNOSIS — N6489 Other specified disorders of breast: Secondary | ICD-10-CM

## 2016-05-06 ENCOUNTER — Other Ambulatory Visit: Payer: Self-pay | Admitting: Family Medicine

## 2016-05-06 ENCOUNTER — Other Ambulatory Visit: Payer: Self-pay

## 2016-05-06 ENCOUNTER — Telehealth: Payer: Self-pay | Admitting: Cardiovascular Disease

## 2016-05-06 DIAGNOSIS — N6489 Other specified disorders of breast: Secondary | ICD-10-CM

## 2016-05-06 NOTE — Telephone Encounter (Signed)
New Message  Pt c/o medication issue:  1. Name of Medication: Benadryl  2. How are you currently taking this medication (dosage and times per day)? Not listed  3. Are you having a reaction (difficulty breathing--STAT)? No  4. What is your medication issue? Pt voiced wanting to know if it's safe to take benadryl.  Please f/u

## 2016-05-06 NOTE — Telephone Encounter (Signed)
Spoke to pt and explained QTc effects with Benedryl and drug interaction with other OTC antihistamines.   She states she understands and was calling because she was stung by wasp and has some swelling localized to site. I recommended topical benedryl and OTC topical steroid to help with swelling since these are not highly systemically absorbed in addition to ice she was already using.   Pt states understand and appreciation for recommendations.

## 2016-05-06 NOTE — Telephone Encounter (Signed)
Will forward to pharmacy for ok for pt to take Benadryl along with cardiac meds.

## 2016-05-09 ENCOUNTER — Ambulatory Visit
Admission: RE | Admit: 2016-05-09 | Discharge: 2016-05-09 | Disposition: A | Discharge status: Home / Self Care | Payer: 59 | Source: Ambulatory Visit | Attending: Family Medicine | Admitting: Family Medicine

## 2016-05-09 ENCOUNTER — Inpatient Hospital Stay: Admission: RE | Admit: 2016-05-09 | Payer: 59 | Source: Ambulatory Visit

## 2016-05-09 DIAGNOSIS — N6489 Other specified disorders of breast: Secondary | ICD-10-CM

## 2016-05-14 ENCOUNTER — Other Ambulatory Visit: Payer: Self-pay | Admitting: Gastroenterology

## 2016-06-16 ENCOUNTER — Ambulatory Visit (INDEPENDENT_AMBULATORY_CARE_PROVIDER_SITE_OTHER): Payer: 59 | Admitting: Family Medicine

## 2016-06-16 ENCOUNTER — Encounter: Payer: Self-pay | Admitting: Family Medicine

## 2016-06-16 VITALS — BP 116/60 | HR 98 | Temp 98.3°F | Resp 16 | Ht 60.0 in | Wt 172.6 lb

## 2016-06-16 DIAGNOSIS — Z1329 Encounter for screening for other suspected endocrine disorder: Secondary | ICD-10-CM

## 2016-06-16 DIAGNOSIS — Z0000 Encounter for general adult medical examination without abnormal findings: Secondary | ICD-10-CM | POA: Diagnosis not present

## 2016-06-16 DIAGNOSIS — E119 Type 2 diabetes mellitus without complications: Secondary | ICD-10-CM

## 2016-06-16 DIAGNOSIS — Z23 Encounter for immunization: Secondary | ICD-10-CM | POA: Diagnosis not present

## 2016-06-16 DIAGNOSIS — R87610 Atypical squamous cells of undetermined significance on cytologic smear of cervix (ASC-US): Secondary | ICD-10-CM | POA: Insufficient documentation

## 2016-06-16 DIAGNOSIS — Z1389 Encounter for screening for other disorder: Secondary | ICD-10-CM | POA: Diagnosis not present

## 2016-06-16 DIAGNOSIS — Z1383 Encounter for screening for respiratory disorder NEC: Secondary | ICD-10-CM

## 2016-06-16 DIAGNOSIS — Z124 Encounter for screening for malignant neoplasm of cervix: Secondary | ICD-10-CM | POA: Diagnosis not present

## 2016-06-16 DIAGNOSIS — Z130 Encounter for screening for diseases of the blood and blood-forming organs and certain disorders involving the immune mechanism: Secondary | ICD-10-CM

## 2016-06-16 DIAGNOSIS — Z136 Encounter for screening for cardiovascular disorders: Secondary | ICD-10-CM | POA: Diagnosis not present

## 2016-06-16 DIAGNOSIS — Z1231 Encounter for screening mammogram for malignant neoplasm of breast: Secondary | ICD-10-CM

## 2016-06-16 LAB — COMPREHENSIVE METABOLIC PANEL
ALK PHOS: 80 U/L (ref 33–130)
ALT: 26 U/L (ref 6–29)
AST: 26 U/L (ref 10–35)
Albumin: 4.1 g/dL (ref 3.6–5.1)
BUN: 16 mg/dL (ref 7–25)
CO2: 27 mmol/L (ref 20–31)
Calcium: 9.2 mg/dL (ref 8.6–10.4)
Chloride: 102 mmol/L (ref 98–110)
Creat: 0.73 mg/dL (ref 0.50–1.05)
GLUCOSE: 183 mg/dL — AB (ref 65–99)
POTASSIUM: 4.3 mmol/L (ref 3.5–5.3)
Sodium: 138 mmol/L (ref 135–146)
Total Bilirubin: 0.6 mg/dL (ref 0.2–1.2)
Total Protein: 6.8 g/dL (ref 6.1–8.1)

## 2016-06-16 LAB — LIPID PANEL
CHOLESTEROL: 204 mg/dL — AB (ref <200)
HDL: 44 mg/dL — AB (ref >50)
LDL CALC: 126 mg/dL — AB (ref <100)
TRIGLYCERIDES: 168 mg/dL — AB (ref <150)
Total CHOL/HDL Ratio: 4.6 Ratio (ref <5.0)
VLDL: 34 mg/dL — ABNORMAL HIGH (ref <30)

## 2016-06-16 LAB — CBC
HEMATOCRIT: 42.2 % (ref 35.0–45.0)
Hemoglobin: 14.1 g/dL (ref 11.7–15.5)
MCH: 29.1 pg (ref 27.0–33.0)
MCHC: 33.4 g/dL (ref 32.0–36.0)
MCV: 87.2 fL (ref 80.0–100.0)
MPV: 8.5 fL (ref 7.5–12.5)
Platelets: 242 10*3/uL (ref 140–400)
RBC: 4.84 MIL/uL (ref 3.80–5.10)
RDW: 13.9 % (ref 11.0–15.0)
WBC: 5.2 10*3/uL (ref 3.8–10.8)

## 2016-06-16 LAB — POCT URINALYSIS DIP (MANUAL ENTRY)
BILIRUBIN UA: NEGATIVE
GLUCOSE UA: NEGATIVE
Ketones, POC UA: NEGATIVE
Leukocytes, UA: NEGATIVE
Nitrite, UA: NEGATIVE
Protein Ur, POC: NEGATIVE
RBC UA: NEGATIVE
SPEC GRAV UA: 1.015
Urobilinogen, UA: 0.2
pH, UA: 5.5

## 2016-06-16 LAB — TSH: TSH: 1.85 mIU/L

## 2016-06-16 NOTE — Patient Instructions (Addendum)
IF you received an x-ray today, you will receive an invoice from [facility]. Please contact [facility] at [phone] with questions or concerns regarding your invoice.   IF you received labwork today, you will receive an invoice from [employer]. Please contact [facility] at [phone] with questions or concerns regarding your invoice.   Our billing staff will not be able to assist you with questions regarding bills from these companies.  You will be contacted with the lab results as soon as they are available. The fastest way to get your results is to activate your My Chart account. Instructions are located on the last page of this paperwork. If you have not heard from [place] regarding the results in 2 weeks, please contact this office.     Dizziness Dizziness is a common problem. It is a feeling of unsteadiness or light-headedness. You may feel like you are about to faint. Dizziness can lead to injury if you stumble or fall. Anyone can become dizzy, but dizziness is more common in older adults. This condition can be caused by a number of things, including medicines, dehydration, or illness. Follow these instructions at home: Taking these steps may help with your condition: Eating and drinking  Drink enough fluid to keep your urine clear or pale yellow. This helps to keep you from becoming dehydrated. Try to drink more clear fluids, such as water.  Do not drink alcohol.  Limit your caffeine intake if directed by your health care provider.  Limit your salt intake if directed by your health care provider. Activity  Avoid making quick movements.  Rise slowly from chairs and steady yourself until you feel okay.  In the morning, first sit up on the side of the bed. When you feel okay, stand slowly while you hold onto something until you know that your balance is fine.  Move your legs often if you need to stand in one place for a long  time. Tighten and relax your muscles in your legs while you are standing.  Do not drive or operate heavy machinery if you feel dizzy.  Avoid bending down if you feel dizzy. Place items in your home so that they are easy for you to reach without leaning over. Lifestyle  Do not use any tobacco products, including cigarettes, chewing tobacco, or electronic cigarettes. If you need help quitting, ask your health care provider.  Try to reduce your stress level, such as with yoga or meditation. Talk with your health care provider if you need help. General instructions  Watch your dizziness for any changes.  Take medicines only as directed by your health care provider. Talk with your health care provider if you think that your dizziness is caused by a medicine that you are taking.  Tell a friend or a family member that you are feeling dizzy. If he or she notices any changes in your behavior, have this person call your health care provider.  Keep all follow-up visits as directed by your health care provider. This is important. Contact a health care provider if:  Your dizziness does not go away.  Your dizziness or light-headedness gets worse.  You feel nauseous.  You have reduced hearing.  You have new symptoms.  You are unsteady on your feet or you feel like the room is spinning. Get help right away if:  You vomit or have diarrhea and are unable to eat or drink anything.  You have problems talking, walking, swallowing, or  using your arms, hands, or legs.  You feel generally weak.  You are not thinking clearly or you have trouble forming sentences. It may take a friend or family member to notice this.  You have chest pain, abdominal pain, shortness of breath, or sweating.  Your vision changes.  You notice any bleeding.  You have a headache.  You have neck pain or a stiff neck.  You have a fever. This information is not intended to replace advice given to you by your health  care provider. Make sure you discuss any questions you have with your health care provider. Document Released: 01/11/2001 Document Revised: 12/24/2015 Document Reviewed: 07/14/2014 Elsevier Interactive Patient Education  2017 [place] [employer], Female Introduction Adopting a healthy lifestyle and getting preventive care can go a long way to promote health and wellness. Talk with your health care provider about what schedule of regular examinations is right for you. This is a good chance for you to check in with your provider about disease prevention and staying healthy. In between checkups, there are plenty of things you can do on your own. Experts have done a lot of research about which lifestyle changes and preventive measures are most likely to keep you healthy. Ask your health care provider for more information. Weight and diet Eat a healthy diet  Be sure to include plenty of vegetables, fruits, low-fat dairy products, and lean protein.  Do not eat a lot of foods high in solid fats, added sugars, or salt.  Get regular exercise. This is one of the most important things you can do for your health.  Most adults should exercise for at least 150 minutes each week. The exercise should increase your heart rate and make you sweat (moderate-intensity exercise).  Most adults should also do strengthening exercises at least twice a week. This is in addition to the moderate-intensity exercise. Maintain a healthy weight  Body mass index (BMI) is a measurement that can be used to identify possible weight problems. It estimates body fat based on height and weight. Your health care provider can help determine your BMI and help you achieve or maintain a healthy weight.  For females 67 years of age and older:  A BMI below 18.5 is considered underweight.  A BMI of 18.5 to 24.9 is normal.  A BMI of 25 to 29.9 is considered overweight.  A BMI of 30 and above is considered obese. Watch  levels of cholesterol and blood lipids  You should start having your blood tested for lipids and cholesterol at 56 years of age, then have this test every 5 years.  You may need to have your cholesterol levels checked more often if:  Your lipid or cholesterol levels are high.  You are older than 56 years of age.  You are at high risk for heart disease. Cancer screening Lung Cancer  Lung cancer screening is recommended for adults 20-66 years old who are at high risk for lung cancer because of a history of smoking.  A yearly low-dose CT scan of the lungs is recommended for people who:  Currently smoke.  Have quit within the past 15 years.  Have at least a 30-pack-year history of smoking. A pack year is smoking an average of one pack of cigarettes a day for 1 year.  Yearly screening should continue until it has been 15 years since you quit.  Yearly screening should stop if you develop a health problem that would prevent you from having  lung cancer treatment. Breast Cancer  Practice breast self-awareness. This means understanding how your breasts normally appear and feel.  It also means doing regular breast self-exams. Let your health care provider know about any changes, no matter how small.  If you are in your 20s or 30s, you should have a clinical breast exam (CBE) by a health care provider every 1-3 years as part of a regular health exam.  If you are 58 or older, have a CBE every year. Also consider having a breast X-ray (mammogram) every year.  If you have a family history of breast cancer, talk to your health care provider about genetic screening.  If you are at high risk for breast cancer, talk to your health care provider about having an MRI and a mammogram every year.  Breast cancer gene (BRCA) assessment is recommended for women who have family members with BRCA-related cancers. BRCA-related cancers include:  Breast.  Ovarian.  Tubal.  Peritoneal  cancers.  Results of the assessment will determine the need for genetic counseling and BRCA1 and BRCA2 testing. Cervical Cancer  Your health care provider may recommend that you be screened regularly for cancer of the pelvic organs (ovaries, uterus, and vagina). This screening involves a pelvic examination, including checking for microscopic changes to the surface of your cervix (Pap test). You may be encouraged to have this screening done every 3 years, beginning at age 61.  For women ages 57-65, health care providers may recommend pelvic exams and Pap testing every 3 years, or they may recommend the Pap and pelvic exam, combined with testing for human papilloma virus (HPV), every 5 years. Some types of HPV increase your risk of cervical cancer. Testing for HPV may also be done on women of any age with unclear Pap test results.  Other health care providers may not recommend any screening for nonpregnant women who are considered low risk for pelvic cancer and who do not have symptoms. Ask your health care provider if a screening pelvic exam is right for you.  If you have had past treatment for cervical cancer or a condition that could lead to cancer, you need Pap tests and screening for cancer for at least 20 years after your treatment. If Pap tests have been discontinued, your risk factors (such as having a new sexual partner) need to be reassessed to determine if screening should resume. Some women have medical problems that increase the chance of getting cervical cancer. In these cases, your health care provider may recommend more frequent screening and Pap tests. Colorectal Cancer  This type of cancer can be detected and often prevented.  Routine colorectal cancer screening usually begins at 56 years of age and continues through 56 years of age.  Your health care provider may recommend screening at an earlier age if you have risk factors for colon cancer.  Your health care provider may also  recommend using home test kits to check for hidden blood in the stool.  A small camera at the end of a tube can be used to examine your colon directly (sigmoidoscopy or colonoscopy). This is done to check for the earliest forms of colorectal cancer.  Routine screening usually begins at age 5.  Direct examination of the colon should be repeated every 5-10 years through 56 years of age. However, you may need to be screened more often if early forms of precancerous polyps or small growths are found. Skin Cancer  Check your skin from head to toe regularly.  Tell your health care provider about any new moles or changes in moles, especially if there is a change in a mole's shape or color.  Also tell your health care provider if you have a mole that is larger than the size of a pencil eraser.  Always use sunscreen. Apply sunscreen liberally and repeatedly throughout the day.  Protect yourself by wearing long sleeves, pants, a wide-brimmed hat, and sunglasses whenever you are outside. Heart disease, diabetes, and high blood pressure  High blood pressure causes heart disease and increases the risk of stroke. High blood pressure is more likely to develop in:  People who have blood pressure in the high end of the normal range (130-139/85-89 mm Hg).  People who are overweight or obese.  People who are African American.  If you are 92-33 years of age, have your blood pressure checked every 3-5 years. If you are 29 years of age or older, have your blood pressure checked every year. You should have your blood pressure measured twice-once when you are at a hospital or clinic, and once when you are not at a hospital or clinic. Record the average of the two measurements. To check your blood pressure when you are not at a hospital or clinic, you can use:  An automated blood pressure machine at a pharmacy.  A home blood pressure monitor.  If you are between 40 years and 2 years old, ask your health  care provider if you should take aspirin to prevent strokes.  Have regular diabetes screenings. This involves taking a blood sample to check your fasting blood sugar level.  If you are at a normal weight and have a low risk for diabetes, have this test once every three years after 56 years of age.  If you are overweight and have a high risk for diabetes, consider being tested at a younger age or more often. Preventing infection Hepatitis B  If you have a higher risk for hepatitis B, you should be screened for this virus. You are considered at high risk for hepatitis B if:  You were born in a country where hepatitis B is common. Ask your health care provider which countries are considered high risk.  Your parents were born in a high-risk country, and you have not been immunized against hepatitis B (hepatitis B vaccine).  You have HIV or AIDS.  You use needles to inject street drugs.  You live with someone who has hepatitis B.  You have had sex with someone who has hepatitis B.  You get hemodialysis treatment.  You take certain medicines for conditions, including cancer, organ transplantation, and autoimmune conditions. Hepatitis C  Blood testing is recommended for:  Everyone born from [birth year] through 1966.  Anyone with known risk factors for hepatitis C. Sexually transmitted infections (STIs)  You should be screened for sexually transmitted infections (STIs) including gonorrhea and chlamydia if:  You are sexually active and are younger than 56 years of age.  You are older than 56 years of age and your health care provider tells you that you are at risk for this type of infection.  Your sexual activity has changed since you were last screened and you are at an increased risk for chlamydia or gonorrhea. Ask your health care provider if you are at risk.  If you do not have HIV, but are at risk, it may be recommended that you take a prescription medicine daily to prevent HIV  infection. This is called pre-exposure prophylaxis (PrEP).  You are considered at risk if:  You are sexually active and do not regularly use condoms or know the HIV status of your partner(s).  You take drugs by injection.  You are sexually active with a partner who has HIV. Talk with your health care provider about whether you are at high risk of being infected with HIV. If you choose to begin PrEP, you should first be tested for HIV. You should then be tested every 3 months for as long as you are taking PrEP. Pregnancy  If you are premenopausal and you may become pregnant, ask your health care provider about preconception counseling.  If you may become pregnant, take 400 to 800 micrograms (mcg) of folic acid every day.  If you want to prevent pregnancy, talk to your health care provider about birth control (contraception). Osteoporosis and menopause  Osteoporosis is a disease in which the bones lose minerals and strength with aging. This can result in serious bone fractures. Your risk for osteoporosis can be identified using a bone density scan.  If you are 32 years of age or older, or if you are at risk for osteoporosis and fractures, ask your health care provider if you should be screened.  Ask your health care provider whether you should take a calcium or vitamin D supplement to lower your risk for osteoporosis.  Menopause may have certain physical symptoms and risks.  Hormone replacement therapy may reduce some of these symptoms and risks. Talk to your health care provider about whether hormone replacement therapy is right for you. Follow these instructions at home:  Schedule regular health, dental, and eye exams.  Stay current with your immunizations.  Do not use any tobacco products including cigarettes, chewing tobacco, or electronic cigarettes.  If you are pregnant, do not drink alcohol.  If you are breastfeeding, limit how much and how often you drink alcohol.  Limit  alcohol intake to no more than 1 drink per day for nonpregnant women. One drink equals 12 ounces of beer, 5 ounces of wine, or 1 ounces of hard liquor.  Do not use street drugs.  Do not share needles.  Ask your health care provider for help if you need support or information about quitting drugs.  Tell your health care provider if you often feel depressed.  Tell your health care provider if you have ever been abused or do not feel safe at home. This information is not intended to replace advice given to you by your health care provider. Make sure you discuss any questions you have with your health care provider. Document Released: 01/31/2011 Document Revised: 12/24/2015 Document Reviewed: 04/21/2015  2017 [employer]

## 2016-06-16 NOTE — Progress Notes (Signed)
Subjective:    Patient ID: Emily [name], female    DOB: 1960-09-20, 55 y.o.   MRN: [id number] Chief Complaint  Patient presents with  . Annual Exam    WITH PAP SMEAR    HPI  Ms. Dura's mother died of a myocardial infarction at age 78. Her sister had peripartum cardiopathy at age 54.  family history is significant for her sister with a history of graves disease.  She does not get any formal exercise but she cares for her 56 yo and 37 yo old grandchildren all day.  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  She cooks most of her meals but notes that her diet could be better.  She is trying to increase her fiber and vegetable intake.  She drinks water, unsweetened tea and one cup of coffee daily.    Cardiology - Dr. Oval Linsey GI - Dr. Ardis Hughs   Imm:  She is allergic to Tetanus vaccine. flu shot - pt always declines             PAP: Pt reports no h/o abnormal pap.  Normal pap here 07/2013 - due for repeat in December 2017. No vaginal bleeding since March 2016             Mam: UTD. done 05/2016 with normal diagnostic test             Colonoscopy: done by Dr. Ardis Hughs March 2017 with 2 tubular adenomas.             Labs: Chol elevated prior but good HDL so work on tlc as ASCVD risk was 1.9% last yr. Pt did have pre-DM with a1c 5.9. Nml vit D 1 yr prior at 26, 81 the yr prior. Is taking vit D 100mg  qd and vit B complex but no calcium.  She does get an adequat amount of calcium in her diet.              Vitamins/Supp: She states that she takes a complete multi-vitamin which includes vitamin B. She states that she takes 2000 mg of vitamin D.  Fish oil upsets her stomach. Uses olive oil and canola oil but not a lot of other good sources of HDL - trying.  Pre-DM 5.9-> 6.1 last yr  Lisanne is following closely with cardiology for a new diagnosis of chronic diastolic heart failure after a cardiac catheterization. She is on propafenone.  Depression screen [facility] 2/9 06/16/2016 06/11/2015 03/23/2015    Decreased Interest 0 0 0  Down, Depressed, Hopeless 0 1 0  PHQ - 2 Score 0 1 0   Past Medical History:  Diagnosis Date  . Allergy   . Chronic diastolic heart failure ([place]) 06/11/2015  . GERD (gastroesophageal reflux disease)   . NSVT (nonsustained ventricular tachycardia) ([place]) 06/13/2015  . PVC's (premature ventricular contractions) 06/11/2015   Past Surgical History:  Procedure Laterality Date  . CARDIAC CATHETERIZATION N/A 06/19/2015   Procedure: Left Heart Cath and Coronary Angiography;  Surgeon: Leonie Man, MD;  Location: [facility];  Service: Cardiovascular;  Laterality: N/A;  . TONSILLECTOMY     when she was 3   Current Outpatient Prescriptions on File Prior to Visit  Medication Sig Dispense Refill  . acetaminophen (TYLENOL) 500 MG tablet Take 500 mg by mouth every 6 (six) hours as needed for moderate pain.    Marland Kitchen aspirin 81 MG tablet Take 81 mg by mouth daily.    . cholecalciferol (VITAMIN D)  1000 UNITS tablet Take 1,000 Units by mouth daily.    Marland Kitchen ibuprofen (ADVIL,MOTRIN) 200 MG tablet Take 200 mg by mouth every 6 (six) hours as needed for mild pain. Reported on 10/26/2015    . Multiple Vitamins-Minerals (MULTIVITAMIN WITH MINERALS) tablet Take 1 tablet by mouth daily.    . nitroGLYCERIN (NITROSTAT) 0.4 MG SL tablet Place 1 tablet (0.4 mg total) under the tongue every 5 (five) minutes as needed for chest pain. 25 tablet 6  . omeprazole (PRILOSEC) 40 MG capsule TAKE 1 CAPSULE (40 MG TOTAL) BY MOUTH DAILY. 90 capsule 3  . propafenone (RYTHMOL) 150 MG tablet Take 1 tablet (150 mg total) by mouth every 8 (eight) hours. 90 tablet 11  . thiamine (VITAMIN B-1) 100 MG tablet Take 100 mg by mouth daily.     No current facility-administered medications on file prior to visit.    Allergies  Allergen Reactions  . Hornet Venom Swelling  . Tetanus Toxoids Other (See Comments)    Big red rash and swelling in the muscle   Family History  Problem Relation Age of Onset  .  Heart disease Mother   . COPD Mother   . Emphysema Mother   . Cancer Mother     oral  . Heart disease Father   . Diabetes Father   . COPD Father   . Vision loss Father   . Skin cancer Father     skin  . Hyperlipidemia Father   . Hypertension Father   . Heart disease Sister   . Graves' disease Sister   . Diabetes Sister   . Hyperlipidemia Sister   . Heart failure Sister 45  . Heart disease Brother   . Hyperlipidemia Brother   . Cancer Paternal Grandmother     unknown  . Prostate cancer Paternal Grandfather     Prostate cancer   Social History   Social History  . Marital status: Married    Spouse name: N/A  . Number of children: 5  . Years of education: N/A   Occupational History  . mother    Social History Main Topics  . Smoking status: Never Smoker  . Smokeless tobacco: Never Used  . Alcohol use No  . Drug use: No  . Sexual activity: Yes   Other Topics Concern  . None   Social History Narrative   Married.  Education: [employer].          Review of Systems  Constitutional: Positive for diaphoresis and fatigue.  HENT: Positive for congestion and postnasal drip.   Eyes: Positive for itching.  Respiratory: Positive for cough and chest tightness.   Cardiovascular: Positive for chest pain and palpitations.  Endocrine: Positive for polyuria.  Musculoskeletal: Positive for arthralgias and neck stiffness.  Allergic/Immunologic: Positive for environmental allergies.  Neurological: Positive for light-headedness. Negative for dizziness.  All other systems reviewed and are negative.      Objective:   Physical Exam  Constitutional: She is oriented to person, place, and time. She appears well-developed and well-nourished. No distress.  HENT:  Head: Normocephalic and atraumatic.  Right Ear: Tympanic membrane, external ear and ear canal normal.  Left Ear: Tympanic membrane, external ear and ear canal normal.  Nose: No mucosal edema or rhinorrhea.  Mouth/Throat:  Uvula is midline, oropharynx is clear and moist and mucous membranes are normal. No posterior oropharyngeal erythema.  Eyes: Conjunctivae and EOM are normal. Pupils are equal, round, and reactive to light. Right eye exhibits no discharge. Left eye  exhibits no discharge. No scleral icterus.  Neck: Normal range of motion. Neck supple. No thyromegaly present.  Cardiovascular: Normal rate, regular rhythm, normal heart sounds and intact distal pulses.   Pulmonary/Chest: Effort normal and breath sounds normal. No respiratory distress.  Abdominal: Soft. Bowel sounds are normal. There is no tenderness.  Genitourinary: Vagina normal and uterus normal. No breast swelling, tenderness, discharge or bleeding. Cervix exhibits no motion tenderness and no friability. Right adnexum displays no mass and no tenderness. Left adnexum displays no mass and no tenderness.  Musculoskeletal: She exhibits no edema.  Lymphadenopathy:    She has no cervical adenopathy.  Neurological: She is alert and oriented to person, place, and time. She has normal reflexes.  Skin: Skin is warm and dry. She is not diaphoretic. No erythema.  Psychiatric: She has a normal mood and affect. Her behavior is normal.         BP 116/60 (BP Location: Left Arm, Patient Position: Sitting, Cuff Size: Normal)   Pulse 98   Temp 98.3 F (36.8 C) (Oral)   Resp 16   Ht 5' (1.524 m)   Wt 172 lb 9.6 oz (78.3 kg)   LMP 10/14/2014   SpO2 98%   BMI 33.71 kg/m  No exam data present Each eye 20/25 independently, 20/20 with both Assessment & Plan:    1. Annual physical exam   2. Encounter for screening mammogram for breast cancer   3. Screening for cardiovascular, respiratory, and genitourinary diseases   4. Screening for cervical cancer - pap done today  5. Screening for deficiency anemia   6. Screening for thyroid disorder   7. Type 2 diabetes mellitus without complication, without long-term current use of insulin ([place]) - New diagnosis today  with A1c of 6.8. Work on [employer] and recheck in 3 months. Will fax glucometer prescription into patient's pharmacy and refer her to diabetic education.   8. Need for prophylactic vaccination and inoculation against influenza     Orders Placed This Encounter  Procedures  . Flu Vaccine QUAD 36+ mos IM  . CBC  . Comprehensive metabolic panel    Order Specific Question:   Has the patient fasted?    Answer:   Yes  . TSH  . Lipid panel    Order Specific Question:   Has the patient fasted?    Answer:   Yes  . Hemoglobin A1c  . Ambulatory referral to diabetic education    Referral Priority:   Routine    Referral Type:   Consultation    Referral Reason:   Specialty Services Required    Number of Visits Requested:   1  . POCT urinalysis dipstick     Delman Cheadle, M.D.  [facility] [address] [place], [place] [address] [phone] phone [phone] fax  06/20/16 12:12 PM

## 2016-06-17 LAB — HEMOGLOBIN A1C
Hgb A1c MFr Bld: 6.8 % — ABNORMAL HIGH (ref <5.7)
Mean Plasma Glucose: 148 mg/dL

## 2016-06-20 ENCOUNTER — Encounter: Payer: Self-pay | Admitting: Family Medicine

## 2016-06-20 MED ORDER — ACCU-CHEK AVIVA PLUS W/DEVICE KIT: PACK | 0 refills | Status: DC

## 2016-06-20 MED ORDER — GLUCOSE BLOOD VI STRP: ORAL_STRIP | 12 refills | Status: DC

## 2016-06-20 MED ORDER — BLOOD GLUCOSE MONITOR KIT: PACK | 0 refills | Status: DC

## 2016-06-24 LAB — PAP IG AND HPV HIGH-RISK: HPV DNA HIGH RISK: NOT DETECTED

## 2016-06-30 ENCOUNTER — Encounter: Payer: Self-pay | Admitting: Family Medicine

## 2016-07-05 ENCOUNTER — Telehealth: Payer: Self-pay

## 2016-07-05 NOTE — Telephone Encounter (Signed)
Pt calling to check up on her my chart email she sent to dr. Brigitte Pulse     Please advise [phone]

## 2016-07-05 NOTE — Telephone Encounter (Signed)
Dr Brigitte Pulse please review.

## 2016-07-06 ENCOUNTER — Encounter: Payer: Self-pay | Admitting: Family Medicine

## 2016-07-06 NOTE — Telephone Encounter (Signed)
Responded to the [employer].

## 2016-07-11 ENCOUNTER — Encounter: Payer: Self-pay | Admitting: Cardiovascular Disease

## 2016-07-11 ENCOUNTER — Ambulatory Visit (INDEPENDENT_AMBULATORY_CARE_PROVIDER_SITE_OTHER): Payer: 59 | Admitting: Cardiovascular Disease

## 2016-07-11 VITALS — BP 135/85 | HR 94 | Ht 59.5 in | Wt 170.0 lb

## 2016-07-11 DIAGNOSIS — E6609 Other obesity due to excess calories: Secondary | ICD-10-CM | POA: Diagnosis not present

## 2016-07-11 DIAGNOSIS — I493 Ventricular premature depolarization: Secondary | ICD-10-CM

## 2016-07-11 NOTE — Patient Instructions (Signed)

## 2016-07-11 NOTE — Progress Notes (Signed)
Cardiology Office Note   Date:  07/11/2016   ID:  Emily [name], DOB 11/06/1960, MRN [id number]  PCP:  Emily Cheadle, MD  Cardiologist:   Emily Latch, MD   Chief Complaint  Patient presents with  . Follow-up  . Shortness of Breath    occassionally.  . Leg Pain    occassionally.  . Edema    minor in feet.    Patient ID: Emily [name] is a 55 y.o. female with hyperlipidemia and family history of premature CAD here for follow up.  Emily [name] was initially seen 05/2015 for chest pain, shortness of breath and palpitations.  She had an ETT on 05/15/15 that was negative for ischemia.  Echo showed LVEF 55-60% with probably hypokinesis of the basal-mid inferolateral and inferior myocardium and grade 1 diastolic dysfunction. She subsequently underwent LHC, which showed no CAD.  Emily [name] also wore a 48 hour Holter that showed frequent PVCs and occasional PACs.  She was started on metoprolol but reported feeling depressed at her follow up appointment.  Metoprolol was discontinued and she was started on propafenone.  She was last seen 12/2015 and was doing well at this time.  She notes that her palpitations remain well-controlled.    She notes episodes of dizziness that occur once per week.  It occurs randomly.  The episodes last for a few seconds at a time.  She denies syncope.  She recently saw her PCP and was told that she has diabetes.  She wonders if her elevated blood Glucoses the cause of her dizziness. She reports episodes of dizziness with hypoglycemia in the past. She has been working on her diet and avoids eating white foods. She's eating mostly whole grains and lots of salads.  She has not been exercising regularly because she reports she does not have any time. She has endorsed significant weight gain since her last appointment.   Past Medical History:  Diagnosis Date  . Allergy   . Chronic diastolic heart failure ([place]) 06/11/2015  . GERD (gastroesophageal reflux disease)   . NSVT  (nonsustained ventricular tachycardia) ([place]) 06/13/2015  . PVC's (premature ventricular contractions) 06/11/2015    Past Surgical History:  Procedure Laterality Date  . CARDIAC CATHETERIZATION N/A 06/19/2015   Procedure: Left Heart Cath and Coronary Angiography;  Surgeon: Emily Man, MD;  Location: [facility];  Service: Cardiovascular;  Laterality: N/A;  . TONSILLECTOMY     when she was 3     Current Outpatient Prescriptions  Medication Sig Dispense Refill  . acetaminophen (TYLENOL) 500 MG tablet Take 500 mg by mouth every 6 (six) hours as needed for moderate pain.    Marland Kitchen aspirin 81 MG tablet Take 81 mg by mouth daily.    . blood glucose meter kit and supplies KIT Dispense based on patient and insurance preference. Use up to four times daily as directed. (FOR ICD-9 250.00, 250.01). 1 each 0  . Blood Glucose Monitoring Suppl (ACCU-CHEK AVIVA PLUS) w/Device KIT Use to test up to 4 times per day. 1 kit 0  . cholecalciferol (VITAMIN D) 1000 UNITS tablet Take 1,000 Units by mouth daily.    Marland Kitchen glucose blood (ACCU-CHEK AVIVA PLUS) test strip Use to test up to 4 times a day. 100 each 12  . ibuprofen (ADVIL,MOTRIN) 200 MG tablet Take 200 mg by mouth every 6 (six) hours as needed for mild pain. Reported on 10/26/2015    . Multiple Vitamins-Minerals (MULTIVITAMIN WITH MINERALS) tablet Take 1  tablet by mouth daily.    . nitroGLYCERIN (NITROSTAT) 0.4 MG SL tablet Place 1 tablet (0.4 mg total) under the tongue every 5 (five) minutes as needed for chest pain. 25 tablet 6  . omeprazole (PRILOSEC) 40 MG capsule TAKE 1 CAPSULE (40 MG TOTAL) BY MOUTH DAILY. 90 capsule 3  . propafenone (RYTHMOL) 150 MG tablet Take 1 tablet (150 mg total) by mouth every 8 (eight) hours. 90 tablet 11  . thiamine (VITAMIN B-1) 100 MG tablet Take 100 mg by mouth daily.     No current facility-administered medications for this visit.     Allergies:   Hornet venom and Tetanus toxoids    Social History:  The patient   reports that she has never smoked. She has never used smokeless tobacco. She reports that she does not drink alcohol or use drugs.   Family History:  [name] family history includes COPD in her father and mother; Cancer in her mother and paternal grandmother; Diabetes in her father and sister; Emphysema in her mother; Emily Primas' disease in her sister; Heart disease in her brother, father, mother, and sister; Heart failure (age of onset: 63) in her sister; Hyperlipidemia in her brother, father, and sister; Hypertension in her father; Prostate cancer in her paternal grandfather; Skin cancer in her father; Vision loss in her father.    ROS:  Please see the history of present illness.   Otherwise, review of systems are positive for none.   All other systems are reviewed and negative. Problem Dr. Orene [name]  PHYSICAL EXAM: VS:  BP 135/85   Pulse 94   Ht 4' 11.5" (1.511 m)   Wt 77.1 kg (170 lb)   LMP 10/14/2014   BMI 33.76 kg/m  , BMI Body mass index is 33.76 kg/m. GENERAL:  Well appearing HEENT:  Pupils equal round and reactive, fundi not visualized, oral mucosa unremarkable NECK:  No jugular venous distention, waveform within normal limits, carotid upstroke brisk and symmetric, no bruits LYMPHATICS:  No cervical adenopathy LUNGS:  Clear to auscultation bilaterally HEART:  RRR.  PMI not displaced or sustained,S1 and S2 within normal limits, no S3, no S4, no clicks, no rubs, no murmurs ABD:  Flat, positive bowel sounds normal in frequency in pitch, no bruits, no rebound, no guarding, no midline pulsatile mass, no hepatomegaly, no splenomegaly EXT:  2 plus pulses throughout, no edema, no cyanosis no clubbing SKIN:  No rashes no nodules NEURO:  Cranial nerves II through XII grossly intact, motor grossly intact throughout PSYCH:  Cognitively intact, oriented to person place and time   EKG:  EKG is ordered today. The ekg ordered 07/11/16 demonstrates sinus rhythm at 94 bpm.  Echo 05/27/15: Study  Conclusions  - Left ventricle: The cavity size was mildly dilated. Systolic  function was normal. The estimated ejection fraction was in the  range of 55% to 60%. Probable hypokinesis of the  basal-midinferolateral and inferior myocardium. There was an  increased relative contribution of atrial contraction to  ventricular filling. Doppler parameters are consistent with  abnormal left ventricular relaxation (grade 1 diastolic  dysfunction). - Aortic valve: Trileaflet; normal thickness, mildly calcified  leaflets. - Mitral valve: There was mild regurgitation.  ETT 05/15/15: Negative for ischemia.  LHC 06/19/15: 1. Angiographically normal coronary arteries 2. There is possible mild left ventricular systolic dysfunction - difficult to assess due to ectopy  48 hour Holter 05/04/15:  Quality: Fair. Baseline artifact. Predominant rhythm: sinus Average heart rate: 92 bpm Max heart rate: 141  bpm Min heart rate: 59 bpm Pauses >2.5 seconds: 0 Ventricular ectopics: 1651 (1616 isolated, 4 couplets, 5 runs of NSVT, longest run 6 beats)  Percentage of ectopic beats: 0.65% Morphology: polymorphic Supraventricular ectopics: 18 Patient did not submit a symptom diary but triggered the monitor for one event, at which time sinus rhythm at 79 bpm was recorded.   Recent Labs: 12/07/2015: Magnesium 2.1 06/16/2016: ALT 26; BUN 16; Creat 0.73; Hemoglobin 14.1; Platelets 242; Potassium 4.3; Sodium 138; TSH 1.85    Lipid Panel    Component Value Date/Time   CHOL 204 (H) 06/16/2016 1338   TRIG 168 (H) 06/16/2016 1338   HDL 44 (L) 06/16/2016 1338   CHOLHDL 4.6 06/16/2016 1338   VLDL 34 (H) 06/16/2016 1338   LDLCALC 126 (H) 06/16/2016 1338   LDLDIRECT 129 (H) 07/27/2012 0809      Wt Readings from Last 3 Encounters:  07/11/16 77.1 kg (170 lb)  06/16/16 78.3 kg (172 lb 9.6 oz)  01/11/16 72.2 kg (159 lb 3.2 oz)      Other studies Reviewed: Additional studies/ records that  were reviewed today include: . Review of the above records demonstrates:  Please see elsewhere in the note.     ASSESSMENT AND PLAN:  # Obesity: Ms. Nelms has gained 11 lb since her last appointment. This is likely contributing to her converting from prediabetes to diabetes. I suggested that she start exercising 30-40 minutes most days of the week and continue to monitor her food intake. She expressed understanding.  # PVCs: Heart catheterization was negative for ischemia and LVEF is >55%.  Palpitations are well-controlled on propafenone.  Electrolytes stable 06/2016.   Current medicines are reviewed at length with the patient today.  The patient does not have concerns regarding medicines.  The following changes have been made: No change  Labs/ tests ordered today include:   No orders of the defined types were placed in this encounter.     Disposition:   FU with Versa Craton C. Oval Linsey, MD in 6 months   Signed, Emily Latch, MD  07/11/2016 11:18 AM    [place]

## 2016-07-12 ENCOUNTER — Ambulatory Visit: Payer: 59

## 2016-07-19 ENCOUNTER — Ambulatory Visit: Payer: 59

## 2016-08-03 ENCOUNTER — Other Ambulatory Visit: Payer: Self-pay | Admitting: Family Medicine

## 2016-08-05 ENCOUNTER — Other Ambulatory Visit: Payer: Self-pay | Admitting: Family Medicine

## 2016-08-05 NOTE — Telephone Encounter (Signed)
Refill was already sent 08/04/2016

## 2016-08-05 NOTE — Telephone Encounter (Signed)
PATIENT WOULD LIKE DR. SHAW TO KNOW THAT SHE CALLED HER PHARMACY 2 DAYS AGO TO ORDER HER 1 TOUCH ULTRA 2 TESTING STRIPS. SHE CALLED THEM BACK TODAY AND THEY TOLD HER SHE NEEDS TO CONTACT HER PCP. NOW SHE HAS COMPLETELY RUN OUT OF THEM. PLEASE CALL THE PHARMACY FOR HER AS SOON AS POSSIBLE. BEST PHONE [phone] (CELL)  PHARMACY CHOICE IS [facility] ON [address].  Emily [name]

## 2016-09-07 NOTE — Progress Notes (Signed)
Subjective:    Patient ID: Emily [name], female    DOB: Jul 10, 1961, 56 y.o.   MRN: [id number] Chief Complaint  Patient presents with  . Diabetes    HPI  [name] is here for a 3 mo f/u of her chronic medical conditions.    DMII: At her CPE 3 mos prior she was newly diagnosed with type 2 DM - her hgba1c was increased to 6.8 <-6.1 <- 5.9. Now up to 7.0.  Pt wanted to work on [place] and recheck in 3 mos.  She was rx'ed a glucometer and referred for diabetic education. She has lost 7 pounds since her last visit!!  2 hrs after lunch it was 220 and then an our later 130.  In the morning it is 180-190. 2 pieces of what toast with <1 tblsp pb. Lunch egg sandwich on whole wheat toast.  Snack apples and cheese or pretzels.  Dinner: meatloaf subs, chili, chicken soup. Bedtime snack: low-sugar ice cream, pretzels vs popcorn. She did have 3 birthdays, xmas, new years over the past 3 months.  She is trying to incorporate exercise.  She is trying to do some sit-ups and leg-lifts in the mornings.  She does a lot of stairs. She did have gestational DM and so is familiar with the need to freq eat. Doesn't drink any sugary drinks. She did have to use insulin in her last pregnancy.   She did have coronary  Cath several yrs ago which was completely cleans. Sees Dr. Cranford Mon every summer. Husband has diabetes for sev yrs and is on metformin.   Depression screen [facility] 2/9 09/08/2016 06/16/2016 06/11/2015 03/23/2015  Decreased Interest 0 0 0 0  Down, Depressed, Hopeless 0 0 1 0  PHQ - 2 Score 0 0 1 0   Past Medical History:  Diagnosis Date  . Allergy   . Chronic diastolic heart failure ([place]) 06/11/2015  . GERD (gastroesophageal reflux disease)   . NSVT (nonsustained ventricular tachycardia) ([place]) 06/13/2015  . PVC's (premature ventricular contractions) 06/11/2015   Past Surgical History:  Procedure Laterality Date  . CARDIAC CATHETERIZATION N/A 06/19/2015   Procedure: Left Heart Cath and Coronary Angiography;   Surgeon: Leonie Man, MD;  Location: [place] [facility];  Service: Cardiovascular;  Laterality: N/A;  . TONSILLECTOMY     when she was 3   Current Outpatient Prescriptions on File Prior to Visit  Medication Sig Dispense Refill  . acetaminophen (TYLENOL) 500 MG tablet Take 500 mg by mouth every 6 (six) hours as needed for moderate pain.    Marland Kitchen aspirin 81 MG tablet Take 81 mg by mouth daily.    . blood glucose meter kit and supplies KIT Dispense based on patient and insurance preference. Use up to four times daily as directed. (FOR ICD-9 250.00, 250.01). 1 each 0  . Blood Glucose Monitoring Suppl (ONE TOUCH ULTRA 2) w/Device KIT USE TO TEST UP TO 4 TIMES PER DAY. 1 each 1  . cholecalciferol (VITAMIN D) 1000 UNITS tablet Take 1,000 Units by mouth daily.    Marland Kitchen glucose blood (ACCU-CHEK AVIVA PLUS) test strip Use to test up to 4 times a day. 100 each 12  . ibuprofen (ADVIL,MOTRIN) 200 MG tablet Take 200 mg by mouth every 6 (six) hours as needed for mild pain. Reported on 10/26/2015    . Multiple Vitamins-Minerals (MULTIVITAMIN WITH MINERALS) tablet Take 1 tablet by mouth daily.    . nitroGLYCERIN (NITROSTAT) 0.4 MG SL tablet Place 1 tablet (  0.4 mg total) under the tongue every 5 (five) minutes as needed for chest pain. 25 tablet 6  . omeprazole (PRILOSEC) 40 MG capsule TAKE 1 CAPSULE (40 MG TOTAL) BY MOUTH DAILY. 90 capsule 3  . propafenone (RYTHMOL) 150 MG tablet Take 1 tablet (150 mg total) by mouth every 8 (eight) hours. 90 tablet 11  . thiamine (VITAMIN B-1) 100 MG tablet Take 100 mg by mouth daily.     No current facility-administered medications on file prior to visit.    Allergies  Allergen Reactions  . Hornet Venom Swelling  . Tetanus Toxoids Other (See Comments)    Big red rash and swelling in the muscle   Family History  Problem Relation Age of Onset  . Heart disease Mother   . COPD Mother   . Emphysema Mother   . Cancer Mother     oral  . Heart disease Father   . Diabetes  Father   . COPD Father   . Vision loss Father   . Skin cancer Father     skin  . Hyperlipidemia Father   . Hypertension Father   . Heart disease Sister   . Graves' disease Sister   . Diabetes Sister   . Hyperlipidemia Sister   . Heart failure Sister 61  . Heart disease Brother   . Hyperlipidemia Brother   . Cancer Paternal Grandmother     unknown  . Prostate cancer Paternal Grandfather     Prostate cancer   Social History   Social History  . Marital status: Married    Spouse name: N/A  . Number of children: 5  . Years of education: N/A   Occupational History  . mother    Social History Main Topics  . Smoking status: Never Smoker  . Smokeless tobacco: Never Used  . Alcohol use No  . Drug use: No  . Sexual activity: Yes   Other Topics Concern  . None   Social History Narrative   Married.  Education: [employer].          Review of Systems See hpi    Objective:   Physical Exam  Constitutional: She is oriented to person, place, and time. She appears well-developed and well-nourished. No distress.  HENT:  Head: Normocephalic and atraumatic.  Right Ear: External ear normal.  Left Ear: External ear normal.  Eyes: Conjunctivae are normal. No scleral icterus.  Neck: Normal range of motion. Neck supple. No thyromegaly present.  Cardiovascular: Normal rate, regular rhythm, normal heart sounds and intact distal pulses.   Pulmonary/Chest: Effort normal and breath sounds normal. No respiratory distress.  Musculoskeletal: She exhibits no edema.  Lymphadenopathy:    She has no cervical adenopathy.  Neurological: She is alert and oriented to person, place, and time.  Skin: Skin is warm and dry. She is not diaphoretic. No erythema.  Psychiatric: She has a normal mood and affect. Her behavior is normal.     Diabetic Foot Exam - Simple   Simple Foot Form Diabetic Foot exam was performed with the following findings:  Yes 09/08/2016 10:19 AM  Visual Inspection No  deformities, no ulcerations, no other skin breakdown bilaterally:  Yes Sensation Testing Intact to touch and monofilament testing bilaterally:  Yes Pulse Check Comments Could not feel pedal pulses on foot exam     BP 110/64   Pulse (!) 104   Temp 98.3 F (36.8 C) (Oral)   Resp 16   Ht 5' (1.524 m)   Wt 165  lb (74.8 kg)   LMP 10/14/2014   SpO2 96%   BMI 32.22 kg/m        Diabetic Foot Exam - Simple   Simple Foot Form Diabetic Foot exam was performed with the following findings:  Yes 09/08/2016 10:19 AM  Visual Inspection No deformities, no ulcerations, no other skin breakdown bilaterally:  Yes Sensation Testing Intact to touch and monofilament testing bilaterally:  Yes Pulse Check Comments Could not feel pedal pulses on foot exam     Assessment & Plan:  ASCUS pap likely due to post-menopausal status. Neg HR HPV. Repeat in 3 yrs If hgba1c is still high, then will need statin as LDL not at goal at 126.  Urine microalb. refer to optho. Pneumovax if a1c > 6.5. Foot exam Pt is allergic to tetanus.    1. Type 2 diabetes mellitus without complication, without long-term current use of insulin ([place])     Orders Placed This Encounter  Procedures  . Pneumococcal polysaccharide vaccine 23-valent greater than or equal to 2yo subcutaneous/IM  . Microalbumin/Creatinine Ratio, Urine  . POCT glycosylated hemoglobin (Hb A1C)  . HM DIABETES FOOT EXAM    Meds ordered this encounter  Medications  . metFORMIN (GLUCOPHAGE) 500 MG tablet    Sig: Take 1 tablet (500 mg total) by mouth 2 (two) times daily with a meal.    Dispense:  180 tablet    Refill:  1    Delman Cheadle, M.D.  Primary Care at [facility] [address] [place], [place] [address] [phone] phone [phone] fax  09/27/16 10:02 PM

## 2016-09-08 ENCOUNTER — Ambulatory Visit (INDEPENDENT_AMBULATORY_CARE_PROVIDER_SITE_OTHER): Payer: 59 | Admitting: Family Medicine

## 2016-09-08 ENCOUNTER — Encounter: Payer: Self-pay | Admitting: Family Medicine

## 2016-09-08 VITALS — BP 110/64 | HR 104 | Temp 98.3°F | Resp 16 | Ht 60.0 in | Wt 165.0 lb

## 2016-09-08 DIAGNOSIS — E119 Type 2 diabetes mellitus without complications: Secondary | ICD-10-CM

## 2016-09-08 DIAGNOSIS — Z23 Encounter for immunization: Secondary | ICD-10-CM

## 2016-09-08 LAB — POCT GLYCOSYLATED HEMOGLOBIN (HGB A1C): HEMOGLOBIN A1C: 7

## 2016-09-08 MED ORDER — METFORMIN HCL 500 MG PO TABS: 500.0000 mg | ORAL_TABLET | Freq: Two times a day (BID) | ORAL | 1 refills | Status: DC

## 2016-09-08 NOTE — Patient Instructions (Addendum)
Let me know if you would like a nutritionist referral - you can call your insurance to see who is on their panel. We often use [employer] and the [employer] nutrition group at [facility]. [facility] also has a diabetic education and nutrition group.   IF you received an x-ray today, you will receive an invoice from [facility]. Please contact [facility] at [phone] with questions or concerns regarding your invoice.   IF you received labwork today, you will receive an invoice from [facility]. Please contact [facility] at [phone] with questions or concerns regarding your invoice.   Our billing staff will not be able to assist you with questions regarding bills from these companies.  You will be contacted with the lab results as soon as they are available. The fastest way to get your results is to activate your My Chart account. Instructions are located on the last page of this paperwork. If you have not heard from [place] regarding the results in 2 weeks, please contact this office.    Diabetes Mellitus and Exercise Exercising regularly is important for your overall health, especially when you have diabetes (diabetes mellitus). Exercising is not only about losing weight. It has many health benefits, such as increasing muscle strength and bone density and reducing body fat and stress. This leads to improved fitness, flexibility, and endurance, all of which result in better overall health. Exercise has additional benefits for people with diabetes, including:  Reducing appetite.  Helping to lower and control blood glucose.  Lowering blood pressure.  Helping to control amounts of fatty substances (lipids) in the blood, such as cholesterol and triglycerides.  Helping the body to respond better to insulin (improving insulin sensitivity).  Reducing how much insulin the body needs.  Decreasing the risk for heart disease by:  Lowering cholesterol and triglyceride  levels.  Increasing the levels of good cholesterol.  Lowering blood glucose levels. What is my activity plan? Your health care provider or certified diabetes educator can help you make a plan for the type and frequency of exercise (activity plan) that works for you. Make sure that you:  Do at least 150 minutes of moderate-intensity or vigorous-intensity exercise each week. This could be brisk walking, biking, or water aerobics.  Do stretching and strength exercises, such as yoga or weightlifting, at least 2 times a week.  Spread out your activity over at least 3 days of the week.  Get some form of physical activity every day.  Do not go more than 2 days in a row without some kind of physical activity.  Avoid being inactive for more than 90 minutes at a time. Take frequent breaks to walk or stretch.  Choose a type of exercise or activity that you enjoy, and set realistic goals.  Start slowly, and gradually increase the intensity of your exercise over time. What do I need to know about managing my diabetes?  Check your blood glucose before and after exercising.  If your blood glucose is higher than 240 mg/dL (13.3 mmol/L) before you exercise, check your urine for ketones. If you have ketones in your urine, do not exercise until your blood glucose returns to normal.  Know the symptoms of low blood glucose (hypoglycemia) and how to treat it. Your risk for hypoglycemia increases during and after exercise. Common symptoms of hypoglycemia can include:  Hunger.  Anxiety.  Sweating and feeling clammy.  Confusion.  Dizziness or feeling light-headed.  Increased heart rate or palpitations.  Blurry vision.  Tingling or numbness around the mouth, lips, or tongue.  Tremors or shakes.  Irritability.  Keep a rapid-acting carbohydrate snack available before, during, and after exercise to help prevent or treat hypoglycemia.  Avoid injecting insulin into areas of the body that are  going to be exercised. For example, avoid injecting insulin into:  The arms, when playing tennis.  The legs, when jogging.  Keep records of your exercise habits. Doing this can help you and your health care provider adjust your diabetes management plan as needed. Write down:  Food that you eat before and after you exercise.  Blood glucose levels before and after you exercise.  The type and amount of exercise you have done.  When your insulin is expected to peak, if you use insulin. Avoid exercising at times when your insulin is peaking.  When you start a new exercise or activity, work with your health care provider to make sure the activity is safe for you, and to adjust your insulin, medicines, or food intake as needed.  Drink plenty of water while you exercise to prevent dehydration or heat stroke. Drink enough fluid to keep your urine clear or pale yellow. This information is not intended to replace advice given to you by your health care provider. Make sure you discuss any questions you have with your health care provider. Document Released: 10/08/2003 Document Revised: 02/05/2016 Document Reviewed: 12/28/2015 Elsevier Interactive Patient Education  2017 [place].  Diabetes Mellitus and Food It is important for you to manage your blood sugar (glucose) level. Your blood glucose level can be greatly affected by what you eat. Eating healthier foods in the appropriate amounts throughout the day at about the same time each day will help you control your blood glucose level. It can also help slow or prevent worsening of your diabetes mellitus. Healthy eating may even help you improve the level of your blood pressure and reach or maintain a healthy weight. General recommendations for healthful eating and cooking habits include:  Eating meals and snacks regularly. Avoid going long periods of time without eating to lose weight.  Eating a diet that consists mainly of plant-based foods,  such as fruits, vegetables, nuts, legumes, and whole grains.  Using low-heat cooking methods, such as baking, instead of high-heat cooking methods, such as deep frying. Work with your dietitian to make sure you understand how to use the Nutrition Facts information on food labels. How can food affect me? Carbohydrates  Carbohydrates affect your blood glucose level more than any other type of food. Your dietitian will help you determine how many carbohydrates to eat at each meal and teach you how to count carbohydrates. Counting carbohydrates is important to keep your blood glucose at a healthy level, especially if you are using insulin or taking certain medicines for diabetes mellitus. Alcohol  Alcohol can cause sudden decreases in blood glucose (hypoglycemia), especially if you use insulin or take certain medicines for diabetes mellitus. Hypoglycemia can be a life-threatening condition. Symptoms of hypoglycemia (sleepiness, dizziness, and disorientation) are similar to symptoms of having too much alcohol. If your health care provider has given you approval to drink alcohol, do so in moderation and use the following guidelines:  Women should not have more than one drink per day, and men should not have more than two drinks per day. One drink is equal to:  12 oz of beer.  5 oz of wine.  1 oz of hard liquor.  Do not drink on an empty stomach.  Keep yourself hydrated. Have water, diet soda, or unsweetened iced tea.  Regular soda, juice, and other mixers might contain a lot of carbohydrates and should be counted. What foods are not recommended? As you make food choices, it is important to remember that all foods are not the same. Some foods have fewer nutrients per serving than other foods, even though they might have the same number of calories or carbohydrates. It is difficult to get your body what it needs when you eat foods with fewer nutrients. Examples of foods that you should avoid that  are high in calories and carbohydrates but low in nutrients include:  Trans fats (most processed foods list trans fats on the Nutrition Facts label).  Regular soda.  Juice.  Candy.  Sweets, such as cake, pie, doughnuts, and cookies.  Fried foods. What foods can I eat? Eat nutrient-rich foods, which will nourish your body and keep you healthy. The food you should eat also will depend on several factors, including:  The calories you need.  The medicines you take.  Your weight.  Your blood glucose level.  Your blood pressure level.  Your cholesterol level. You should eat a variety of foods, including:  Protein.  Lean cuts of meat.  Proteins low in saturated fats, such as fish, egg whites, and beans. Avoid processed meats.  Fruits and vegetables.  Fruits and vegetables that may help control blood glucose levels, such as apples, mangoes, and yams.  Dairy products.  Choose fat-free or low-fat dairy products, such as milk, yogurt, and cheese.  Grains, bread, pasta, and rice.  Choose whole grain products, such as multigrain bread, whole oats, and brown rice. These foods may help control blood pressure.  Fats.  Foods containing healthful fats, such as nuts, avocado, olive oil, canola oil, and fish. Does everyone with diabetes mellitus have the same meal plan? Because every person with diabetes mellitus is different, there is not one meal plan that works for everyone. It is very important that you meet with a dietitian who will help you create a meal plan that is just right for you. This information is not intended to replace advice given to you by your health care provider. Make sure you discuss any questions you have with your health care provider. Document Released: 04/14/2005 Document Revised: 12/24/2015 Document Reviewed: 06/14/2013 Elsevier Interactive Patient Education  2017 [place] for Eating Away From Home If You Have Diabetes Controlling your  level of blood glucose, also known as blood sugar, can be challenging. It can be even more difficult when you do not prepare your own meals. The following tips can help you manage your diabetes when you eat away from home. Planning ahead Plan ahead if you know you will be eating away from home:  Ask your health care provider how to time meals and medicine if you are taking insulin.  Make a list of restaurants near you that offer healthy choices. If they have a carry-out menu, take it home and plan what you will order ahead of time.  Look up the restaurant you want to eat at online. Many chain and fast-food restaurants list nutritional information online. Use this information to choose the healthiest options and to calculate how many carbohydrates will be in your meal.  Use a carbohydrate-counting book or mobile app to look up the carbohydrate content and serving size of the foods you want to eat.  Become familiar with serving sizes and learn to recognize how many servings  are in a portion. This will allow you to estimate how many carbohydrates you can eat. Free foods A "free food" is any food or drink that has less than 5 g of carbohydrates per serving. Free foods include:  Many vegetables.  Hard boiled eggs.  Nuts or seeds.  Olives.  Cheeses.  Meats. These types of foods make good appetizer choices and are often available at salad bars. Lemon juice, vinegar, or a low-calorie salad dressing of fewer than 20 calories per serving can be used as a "free" salad dressing. Choices to reduce carbohydrates  Substitute nonfat sweetened yogurt with a sugar-free yogurt. Yogurt made from soy milk may also be used, but you will still want a sugar-free or plain option to choose a lower carbohydrate amount.  Ask your server to take away the bread basket or chips from your table.  Order fresh fruit. A salad bar often offers fresh fruit choices. Avoid canned fruit because it is usually packed in  sugar or syrup.  Order a salad, and eat it without dressing. Or, create a "free" salad dressing.  Ask for substitutions. For example, instead of [place] fries, request an order of a vegetable such as salad, green beans, or broccoli. Other tips  If you take insulin, take the insulin once your food arrives to your table. This will ensure your insulin and food are timed correctly.  Ask your server about the portion size before your order, and ask for a take-out box if the portion has more servings than you should have. When your food comes, leave the amount you should have on the plate, and put the rest in the take-out box.  Consider splitting an entree with someone and ordering a side salad. This information is not intended to replace advice given to you by your health care provider. Make sure you discuss any questions you have with your health care provider. Document Released: 07/18/2005 Document Revised: 12/24/2015 Document Reviewed: 10/15/2013 Elsevier Interactive Patient Education  2017 [employer].

## 2016-09-09 LAB — MICROALBUMIN / CREATININE URINE RATIO: CREATININE, UR: 66.6 mg/dL

## 2016-11-08 ENCOUNTER — Other Ambulatory Visit: Payer: Self-pay | Admitting: *Deleted

## 2016-11-08 MED ORDER — NITROGLYCERIN 0.4 MG SL SUBL: 0.4000 mg | SUBLINGUAL_TABLET | SUBLINGUAL | 1 refills | Status: AC | PRN

## 2016-11-23 ENCOUNTER — Other Ambulatory Visit: Payer: Self-pay | Admitting: Cardiovascular Disease

## 2016-11-23 DIAGNOSIS — I493 Ventricular premature depolarization: Secondary | ICD-10-CM

## 2016-11-23 MED ORDER — PROPAFENONE HCL 150 MG PO TABS: 150.0000 mg | ORAL_TABLET | Freq: Three times a day (TID) | ORAL | 7 refills | Status: DC

## 2017-01-02 NOTE — Progress Notes (Signed)
Cardiology Office Note   Date:  01/09/2017   ID:  Emily [name], DOB 1961/03/08, MRN [id number]  PCP:  [name], MD  Cardiologist:   Skeet Latch, MD   Chief Complaint  [name] presents with  . Follow-up    6 mo rov.  [name] has some questions.diagnosed with diabetes 6 months ago.cough for months.    [name] is a 56 y.o. female with diabetes, hyperlipidemia and family history of premature CAD here for follow up.  Emily [name] was initially seen 05/2015 for chest pain, shortness of breath and palpitations.  She had an ETT on 05/15/15 that was negative for ischemia.  Echo showed LVEF 55-60% with probably hypokinesis of Emily basal-mid inferolateral and inferior myocardium and grade 1 diastolic dysfunction. She subsequently underwent LHC, which showed no CAD.  Emily [name] also wore a 48 hour Holter that showed frequent PVCs and occasional PACs.  She was started on metoprolol but reported feeling depressed at her follow up appointment.  Metoprolol was discontinued and she was started on propafenone.    For Emily last several months she has been coughing chronically.  Emily coughing is worse when she lays down and first thing in Emily morning. She notes sinus congestion and drainage. She denies fever or chills. Emily cough is occasionally productive of white phlegm. She denies chest pain but does have some shortness of breath with exertion. She gets tired when walking up Emily stairs, which did not used to have been in Emily past.  She notes that her heart races with minimal activity such as combing her hair or bending over and gardening.She denies lightheadedness or dizziness.  She has PVCs and very occasionally. It is much better controlled than in Emily past.  She sometimes checks her blood pressure at home or in Emily grocery store. It typically runs in Emily 90s/60s-100s/70s  Past Medical History:  Diagnosis Date  . Allergy   . Chronic diastolic heart failure ([place]) 06/11/2015  . GERD  (gastroesophageal reflux disease)   . NSVT (nonsustained ventricular tachycardia) ([place]) 06/13/2015  . PVC's (premature ventricular contractions) 06/11/2015    Past Surgical History:  Procedure Laterality Date  . CARDIAC CATHETERIZATION N/A 06/19/2015   Procedure: Left Heart Cath and Coronary Angiography;  Surgeon: Leonie Man, MD;  Location: [facility];  Service: Cardiovascular;  Laterality: N/A;  . TONSILLECTOMY     when she was 3     Current Outpatient Prescriptions  Medication Sig Dispense Refill  . acetaminophen (TYLENOL) 500 MG tablet Take 500 mg by mouth every 6 (six) hours as needed for moderate pain.    Marland Kitchen aspirin 81 MG tablet Take 81 mg by mouth daily.    . blood glucose meter kit and supplies KIT Dispense based on [name] and insurance preference. Use up to four times daily as directed. (FOR ICD-9 250.00, 250.01). 1 each 0  . Blood Glucose Monitoring Suppl (ONE TOUCH ULTRA 2) w/Device KIT USE TO TEST UP TO 4 TIMES PER DAY. 1 each 1  . cholecalciferol (VITAMIN D) 1000 UNITS tablet Take 1,000 Units by mouth daily.    Marland Kitchen glucose blood (ACCU-CHEK AVIVA PLUS) test strip Use to test up to 4 times a day. 100 each 12  . ibuprofen (ADVIL,MOTRIN) 200 MG tablet Take 200 mg by mouth every 6 (six) hours as needed for mild pain. Reported on 10/26/2015    . metFORMIN (GLUCOPHAGE) 500 MG tablet Take 1 tablet (500 mg total)  by mouth 2 (two) times daily with a meal. 180 tablet 1  . Multiple Vitamins-Minerals (MULTIVITAMIN WITH MINERALS) tablet Take 1 tablet by mouth daily.    . nitroGLYCERIN (NITROSTAT) 0.4 MG SL tablet Place 1 tablet (0.4 mg total) under Emily tongue every 5 (five) minutes as needed for chest pain. 25 tablet 1  . omeprazole (PRILOSEC) 40 MG capsule TAKE 1 CAPSULE (40 MG TOTAL) BY MOUTH DAILY. 90 capsule 3  . propafenone (RYTHMOL) 150 MG tablet Take 1 tablet (150 mg total) by mouth every 8 (eight) hours. 90 tablet 7  . thiamine (VITAMIN B-1) 100 MG tablet Take 100 mg by  mouth daily.     No current facility-administered medications for this visit.     Allergies:   Hornet venom and Tetanus toxoids    Social History:  Emily [name]  reports that she has never smoked. She has never used smokeless tobacco. She reports that she does not drink alcohol or use drugs.   Family History:  Emily [name]'s family history includes COPD in her father and mother; Cancer in her mother and paternal grandmother; Diabetes in her father and sister; Emphysema in her mother; [name]' disease in her sister; Heart disease in her brother, father, mother, and sister; Heart failure (age of onset: 60) in her sister; Hyperlipidemia in her brother, father, and sister; Hypertension in her father; Prostate cancer in her paternal grandfather; Skin cancer in her father; Vision loss in her father.    ROS:  Please see Emily history of present illness.   Otherwise, review of systems are positive for neck stiffness, fatigue.   All other systems are reviewed and negative. Problem Dr. Orene Desanctis  PHYSICAL EXAM: VS:  BP 126/72   Pulse 91   Ht 4' 11.5" (1.511 m)   Wt 74.8 kg (164 lb 12.8 oz)   LMP 10/14/2014   BMI 32.73 kg/m  , BMI Body mass index is 32.73 kg/m. GENERAL:  Well appearing.  No acute distress.  Frequent coughing. HEENT:  Pupils equal round and reactive, fundi not visualized, oral mucosa unremarkable NECK:  No jugular venous distention, waveform within normal limits, carotid upstroke brisk and symmetric LUNGS:  Clear to auscultation bilaterally.  No crackles, wheezes or rhonchi HEART:  RRR.  PMI not displaced or sustained,S1 and S2 within normal limits, no S3, no S4, no clicks, no rubs, no murmurs ABD:  Flat, positive bowel sounds normal in frequency in pitch, no bruits, no rebound, no guarding, no midline pulsatile mass, no hepatomegaly, no splenomegaly EXT:  2 plus pulses throughout, no edema, no cyanosis no clubbing SKIN:  No rashes no nodules NEURO:  Cranial nerves II through XII grossly  intact, motor grossly intact throughout PSYCH:  Cognitively intact, oriented to person place and time   EKG:  EKG is ordered today. Emily ekg ordered 07/11/16 demonstrates sinus rhythm at 94 bpm. 01/09/17: Sinus rhythm. Rate 91 bpm.  Echo 05/27/15: Study Conclusions  - Left ventricle: Emily cavity size was mildly dilated. Systolic  function was normal. Emily estimated ejection fraction was in Emily  range of 55% to 60%. Probable hypokinesis of Emily  basal-midinferolateral and inferior myocardium. There was an  increased relative contribution of atrial contraction to  ventricular filling. Doppler parameters are consistent with  abnormal left ventricular relaxation (grade 1 diastolic  dysfunction). - Aortic valve: Trileaflet; normal thickness, mildly calcified  leaflets. - Mitral valve: There was mild regurgitation.  ETT 05/15/15: Negative for ischemia.  LHC 06/19/15: 1. Angiographically normal coronary arteries  2. There is possible mild left ventricular systolic dysfunction - difficult to assess due to ectopy  48 hour Holter 05/04/15:  Quality: Fair. Baseline artifact. Predominant rhythm: sinus Average heart rate: 92 bpm Max heart rate: 141 bpm Min heart rate: 59 bpm Pauses >2.5 seconds: 0 Ventricular ectopics: 1651 (1616 isolated, 4 couplets, 5 runs of NSVT, longest run 6 beats)  Percentage of ectopic beats: 0.65% Morphology: polymorphic Supraventricular ectopics: 18 [name] did not submit a symptom diary but triggered Emily monitor for one event, at which time sinus rhythm at 79 bpm was recorded.   Recent Labs: 06/16/2016: ALT 26; BUN 16; Creat 0.73; Hemoglobin 14.1; Platelets 242; Potassium 4.3; Sodium 138; TSH 1.85    Lipid Panel    Component Value Date/Time   CHOL 204 (H) 06/16/2016 1338   TRIG 168 (H) 06/16/2016 1338   HDL 44 (L) 06/16/2016 1338   CHOLHDL 4.6 06/16/2016 1338   VLDL 34 (H) 06/16/2016 1338   LDLCALC 126 (H) 06/16/2016 1338   LDLDIRECT  129 (H) 07/27/2012 0809      Wt Readings from Last 3 Encounters:  01/09/17 74.8 kg (164 lb 12.8 oz)  09/08/16 74.8 kg (165 lb)  07/11/16 77.1 kg (170 lb)      Other studies Reviewed: Additional studies/ records that were reviewed today include: . Review of Emily above records demonstrates:  Please see elsewhere in Emily note.     ASSESSMENT AND PLAN:  # Chronic diastolic heart failure:  Euvolemic. No changes.  # Hyperlipidemia: We discussed Emily fact that now that she has diabetes, cholesterol levels are not at goal.  Recommended increased exercise and limiting fried foods, fatty foods and cheese.  # Obesity: Emily [name] has gained 11 lb since her last appointment. This is likely contributing to her converting from prediabetes to diabetes. I suggested that she start exercising 30-40 minutes most days of Emily week and continue to monitor her food intake. She expressed understanding.  # PVCs: Heart catheterization was negative for ischemia and LVEF is >55%.  Palpitations are well-controlled on propafenone.  Electrolytes stable 06/2016.   Current medicines are reviewed at length with Emily [name] today.  Emily [name] does not have concerns regarding medicines.  Emily following changes have been made: No change  Labs/ tests ordered today include:   No orders of Emily defined types were placed in this encounter.     Disposition:   FU with Birl Lobello C. Oval Linsey, MD in 6 months   Signed, Skeet Latch, MD  01/09/2017 10:11 AM    [place]

## 2017-01-09 ENCOUNTER — Ambulatory Visit (INDEPENDENT_AMBULATORY_CARE_PROVIDER_SITE_OTHER): Payer: 59 | Admitting: Cardiovascular Disease

## 2017-01-09 ENCOUNTER — Encounter: Payer: Self-pay | Admitting: Cardiovascular Disease

## 2017-01-09 VITALS — BP 126/72 | HR 91 | Ht 59.5 in | Wt 164.8 lb

## 2017-01-09 DIAGNOSIS — I5032 Chronic diastolic (congestive) heart failure: Secondary | ICD-10-CM

## 2017-01-09 DIAGNOSIS — E7800 Pure hypercholesterolemia, unspecified: Secondary | ICD-10-CM | POA: Diagnosis not present

## 2017-01-09 DIAGNOSIS — I493 Ventricular premature depolarization: Secondary | ICD-10-CM | POA: Diagnosis not present

## 2017-01-09 NOTE — Patient Instructions (Signed)
Continue same medications.   Your physician wants you to follow-up in: 6 months.  You will receive a reminder letter in the mail two months in advance. If you don't receive a letter, please call our office to schedule the follow-up appointment.  

## 2017-01-28 ENCOUNTER — Encounter: Payer: Self-pay | Admitting: Family Medicine

## 2017-01-28 ENCOUNTER — Ambulatory Visit (INDEPENDENT_AMBULATORY_CARE_PROVIDER_SITE_OTHER): Payer: 59 | Admitting: Family Medicine

## 2017-01-28 VITALS — BP 132/87 | HR 98 | Temp 98.4°F | Resp 16 | Ht 59.5 in | Wt 163.8 lb

## 2017-01-28 DIAGNOSIS — L509 Urticaria, unspecified: Secondary | ICD-10-CM

## 2017-01-28 DIAGNOSIS — E119 Type 2 diabetes mellitus without complications: Secondary | ICD-10-CM | POA: Diagnosis not present

## 2017-01-28 DIAGNOSIS — Z5181 Encounter for therapeutic drug level monitoring: Secondary | ICD-10-CM | POA: Diagnosis not present

## 2017-01-28 DIAGNOSIS — I5032 Chronic diastolic (congestive) heart failure: Secondary | ICD-10-CM

## 2017-01-28 MED ORDER — METHYLPREDNISOLONE ACETATE 80 MG/ML IJ SUSP
80.0000 mg | Freq: Once | INTRAMUSCULAR | Status: AC
  Administered 2017-01-28: 80 mg via INTRAMUSCULAR

## 2017-01-28 MED ORDER — RANITIDINE HCL 150 MG PO TABS: 150.0000 mg | ORAL_TABLET | Freq: Two times a day (BID) | ORAL | 0 refills | Status: DC

## 2017-01-28 MED ORDER — CETIRIZINE HCL 10 MG PO TABS: 10.0000 mg | ORAL_TABLET | Freq: Every day | ORAL | 0 refills | Status: DC

## 2017-01-28 NOTE — Progress Notes (Signed)
Subjective:  By signing my name below, I, Moises Blood, attest that this documentation has been prepared under the direction and in the presence of Delman Cheadle, MD. Electronically Signed: Moises Blood, [place]. 01/28/2017 , 3:12 PM .  Patient was seen in Room 13 .   Patient ID: [name], female    DOB: November 27, 1960, 56 y.o.   MRN: [id number] Chief Complaint  Patient presents with  . Rash    torsal, legs, arms    HPI LINZEY RAMSER is a 56 y.o. female who presents to Primary Care at [facility] complaining of rash over her torso, legs and arms ongoing for several weeks. She states it started over her left flank, but spread over different places of her body. She initially thought it was contact dermatitis so she tried looser clothing without relief. She has felt some tingling in her lips. She isn't able to take oral benadryl due to her heart medications interactions so she's been using topical cortisone ointment without much relief. She reports her itching worsens as the day progresses, and worst at night. Her symptoms have improved today. She denies any change in her routine; denies new soaps, new detergent, or new sheets. She has been doing yard work, but mostly just watering; no new activities. She denies any new herbs, bushes or plants. She denies any new fertilizers or soils. She denies overindulgence of any fruit. She denies any new animals or furniture. She denies any new supplements. She denies seasonal allergies symptoms. In the past, when she gets stung or has a tetanus, she usually has a large red spot.   She states her sugars have been doing well.   Past Medical History:  Diagnosis Date  . Allergy   . Chronic diastolic heart failure ([place]) 06/11/2015  . GERD (gastroesophageal reflux disease)   . NSVT (nonsustained ventricular tachycardia) ([place]) 06/13/2015  . PVC's (premature ventricular contractions) 06/11/2015   Prior to Admission medications   Medication Sig Start Date End Date  Taking? Authorizing Provider  acetaminophen (TYLENOL) 500 MG tablet Take 500 mg by mouth every 6 (six) hours as needed for moderate pain.    [provider]  aspirin 81 MG tablet Take 81 mg by mouth daily.    [provider]  blood glucose meter kit and supplies KIT Dispense based on patient and insurance preference. Use up to four times daily as directed. (FOR ICD-9 250.00, 250.01). 06/20/16   [name], MD  Blood Glucose Monitoring Suppl (ONE TOUCH ULTRA 2) w/Device KIT USE TO TEST UP TO 4 TIMES PER DAY. 08/04/16   [name], MD  cholecalciferol (VITAMIN D) 1000 UNITS tablet Take 1,000 Units by mouth daily.    [provider]  glucose blood (ACCU-CHEK AVIVA PLUS) test strip Use to test up to 4 times a day. 06/20/16   [name], MD  ibuprofen (ADVIL,MOTRIN) 200 MG tablet Take 200 mg by mouth every 6 (six) hours as needed for mild pain. Reported on 10/26/2015    [provider]  metFORMIN (GLUCOPHAGE) 500 MG tablet Take 1 tablet (500 mg total) by mouth 2 (two) times daily with a meal. 09/08/16   [name], MD  Multiple Vitamins-Minerals (MULTIVITAMIN WITH MINERALS) tablet Take 1 tablet by mouth daily.    [provider]  nitroGLYCERIN (NITROSTAT) 0.4 MG SL tablet Place 1 tablet (0.4 mg total) under the tongue every 5 (five) minutes as needed for chest pain. 11/08/16   Skeet Latch, MD  omeprazole (PRILOSEC) 40 MG capsule TAKE 1 CAPSULE (40 MG TOTAL) BY MOUTH DAILY. 05/16/16   Milus Banister, MD  propafenone (RYTHMOL) 150 MG tablet Take 1 tablet (150 mg total) by mouth every 8 (eight) hours. 11/23/16   Skeet Latch, MD  thiamine (VITAMIN B-1) 100 MG tablet Take 100 mg by mouth daily.    [provider]   Allergies  Allergen Reactions  . Hornet Venom Swelling  . Tetanus Toxoids Other (See Comments)    Big red rash and swelling in the muscle   Review of Systems  Constitutional: Negative for chills, fatigue, fever and unexpected  weight change.  Respiratory: Negative for cough.   Gastrointestinal: Negative for constipation, diarrhea, nausea and vomiting.  Skin: Positive for rash. Negative for wound.  Allergic/Immunologic: Negative for environmental allergies.  Neurological: Negative for dizziness, weakness and headaches.       Objective:   Physical Exam  Constitutional: She is oriented to person, place, and time. She appears well-developed and well-nourished. No distress.  HENT:  Head: Normocephalic and atraumatic.  Right Ear: Tympanic membrane normal.  Left Ear: Tympanic membrane normal.  Nose: Nose normal.  Mouth/Throat: Oropharynx is clear and moist.  Eyes: EOM are normal. Pupils are equal, round, and reactive to light.  Neck: Neck supple. No thyromegaly present.  Cardiovascular: Regular rhythm, S1 normal, S2 normal and normal heart sounds.  Tachycardia present.   No murmur heard. Pulmonary/Chest: Effort normal. No respiratory distress.  Musculoskeletal: Normal range of motion.  Lymphadenopathy:    She has no cervical adenopathy.  Neurological: She is alert and oriented to person, place, and time.  Skin: Skin is warm and dry. Rash noted.  Diffuse blanching, erythematous, well defined plaques, round, and clustered diffusely spread over her lower trunk, waistline, and proximal extremities  Psychiatric: She has a normal mood and affect. Her behavior is normal.  Nursing note and vitals reviewed.   BP 132/87   Pulse 98   Temp 98.4 F (36.9 C) (Oral)   Resp 16   Ht 4' 11.5" (1.511 m)   Wt 163 lb 12.8 oz (74.3 kg)   LMP 10/14/2014   SpO2 100%   BMI 32.53 kg/m      Assessment & Plan:   1. Urticaria   2. Type 2 diabetes mellitus without complication, without long-term current use of insulin ([place])   3. Medication monitoring encounter   4. Chronic diastolic heart failure ([facility])   Unnknown etiology  Meds ordered this encounter  Medications  . methylPREDNISolone acetate (DEPO-MEDROL) injection 80  mg  . cetirizine (ZYRTEC) 10 MG tablet    Sig: Take 1 tablet (10 mg total) by mouth at bedtime.    Dispense:  30 tablet    Refill:  0  . ranitidine (ZANTAC) 150 MG tablet    Sig: Take 1 tablet (150 mg total) by mouth 2 (two) times daily.    Dispense:  60 tablet    Refill:  0    I personally performed the services described in this documentation, which was scribed in my presence. The recorded information has been reviewed and considered, and addended by me as needed.   Delman Cheadle, M.D.  Primary Care at [facility] [address] [place], [place] [address] [phone] phone [phone] fax  01/30/17 12:30 AM

## 2017-01-28 NOTE — Patient Instructions (Addendum)
   IF you received an x-ray today, you will receive an invoice from [facility]. Please contact [facility] at [phone] with questions or concerns regarding your invoice.   IF you received labwork today, you will receive an invoice from [facility]. Please contact [facility] at [phone] with questions or concerns regarding your invoice.   Our billing staff will not be able to assist you with questions regarding bills from these companies.  You will be contacted with the lab results as soon as they are available. The fastest way to get your results is to activate your My Chart account. Instructions are located on the last page of this paperwork. If you have not heard from us regarding the results in 2 weeks, please contact this office.     Hives Hives (urticaria) are itchy, red, swollen areas on your skin. Hives can appear on any part of your body and can vary in size. They can be as small as the tip of a pen or much larger. Hives often fade within 24 hours (acute hives). In other cases, new hives appear after old ones fade. This cycle can continue for several days or weeks (chronic hives). Hives result from your body's reaction to an irritant or to something that you are allergic to (trigger). When you are exposed to a trigger, your body releases a chemical (histamine) that causes redness, itching, and swelling. You can get hives immediately after being exposed to a trigger or hours later. Hives do not spread from person to person (are not contagious). Your hives may get worse with scratching, exercise, and emotional stress. What are the causes? Causes of this condition include:  Allergies to certain foods or ingredients.  Insect bites or stings.  Exposure to pollen or pet dander.  Contact with latex or chemicals.  Spending time in sunlight, heat, or cold (exposure).  Exercise.  Stress.  You can also get hives from some medical conditions and treatments. These  include:  Viruses, including the common cold.  Bacterial infections, such as urinary tract infections and strep throat.  Disorders such as vasculitis, lupus, or thyroid disease.  Certain medications.  Allergy shots.  Blood transfusions.  Sometimes, the cause of hives is not known (idiopathic hives). What increases the risk? This condition is more likely to develop in:  Women.  People who have food allergies, especially to citrus fruits, milk, eggs, peanuts, tree nuts, or shellfish.  People who are allergic to: ? Medicines. ? Latex. ? Insects. ? Animals. ? Pollen.  People who have certain medical conditions, includinglupus or thyroid disease.  What are the signs or symptoms? The main symptom of this condition is raised, itchyred or white bumps or patches on your skin. These areas may:  Become large and swollen (welts).  Change in shape and location, quickly and repeatedly.  Be separate hives or connect over a large area of skin.  Sting or become painful.  Turn white when pressed in the center (blanch).  In severe cases, yourhands, feet, and face may also become swollen. This may occur if hives develop deeper in your skin. How is this diagnosed? This condition is diagnosed based on your symptoms, medical history, and physical exam. Your skin, urine, or blood may be tested to find out what is causing your hives and to rule out other health issues. Your health care provider may also remove a small sample of skin from the affected area and examine it under a microscope (biopsy). How is this treated? Treatment   depends on the severity of your condition. Your health care provider may recommend using cool, wet cloths (cool compresses) or taking cool showers to relieve itching. Hives are sometimes treated with medicines, including:  Antihistamines.  Corticosteroids.  Antibiotics.  An injectable medicine (omalizumab). Your health care provider may prescribe this if you  have chronic idiopathic hives and you continue to have symptoms even after treatment with antihistamines.  Severe cases may require an emergency injection of adrenaline (epinephrine) to prevent a life-threatening allergic reaction (anaphylaxis). Follow these instructions at home: Medicines  Take or apply over-the-counter and prescription medicines only as told by your health care provider.  If you were prescribed an antibiotic medicine, use it as told by your health care provider. Do not stop taking the antibiotic even if you start to feel better. Skin Care  Apply cool compresses to the affected areas.  Do not scratch or rub your skin. General instructions  Do not take hot showers or baths. This can make itching worse.  Do not wear tight-fitting clothing.  Use sunscreen and wear protective clothing when you are outside.  Avoid any substances that cause your hives. Keep a journal to help you track what causes your hives. Write down: ? What medicines you take. ? What you eat and drink. ? What products you use on your skin.  Keep all follow-up visits as told by your health care provider. This is important. Contact a health care provider if:  Your symptoms are not controlled with medicine.  Your joints are painful or swollen. Get help right away if:  You have a fever.  You have pain in your abdomen.  Your tongue or lips are swollen.  Your eyelids are swollen.  Your chest or throat feels tight.  You have trouble breathing or swallowing. These symptoms may represent a serious problem that is an emergency. Do not wait to see if the symptoms will go away. Get medical help right away. Call your local emergency services (911 in the [place] Do not drive yourself to the hospital. This information is not intended to replace advice given to you by your health care provider. Make sure you discuss any questions you have with your health care provider. Document Released: 07/18/2005  Document Revised: 12/16/2015 Document Reviewed: 05/06/2015 Elsevier Interactive Patient Education  2017 [employer]vier Inc.  

## 2017-02-10 LAB — HM DIABETES EYE EXAM

## 2017-02-14 ENCOUNTER — Other Ambulatory Visit: Payer: Self-pay | Admitting: Family Medicine

## 2017-02-16 NOTE — Telephone Encounter (Signed)
Pt is long-overdue for a f/u OV with FASTING labs. Please have her sched an appt within the next 1-2 month so we can continue to make sure the medicines are keeping her healthy and safe before we refill them further. Thanks.

## 2017-02-20 NOTE — Telephone Encounter (Signed)
mychart message sent to pt about making an apt °

## 2017-02-22 ENCOUNTER — Other Ambulatory Visit: Payer: Self-pay | Admitting: Gastroenterology

## 2017-02-24 ENCOUNTER — Other Ambulatory Visit: Payer: Self-pay | Admitting: Family Medicine

## 2017-05-06 ENCOUNTER — Ambulatory Visit: Payer: 59 | Admitting: Family Medicine

## 2017-05-23 ENCOUNTER — Telehealth: Payer: Self-pay | Admitting: Family Medicine

## 2017-05-23 DIAGNOSIS — E7800 Pure hypercholesterolemia, unspecified: Secondary | ICD-10-CM

## 2017-05-23 DIAGNOSIS — E119 Type 2 diabetes mellitus without complications: Secondary | ICD-10-CM

## 2017-05-23 DIAGNOSIS — I493 Ventricular premature depolarization: Secondary | ICD-10-CM

## 2017-05-23 DIAGNOSIS — Z8349 Family history of other endocrine, nutritional and metabolic diseases: Secondary | ICD-10-CM

## 2017-05-23 NOTE — Telephone Encounter (Signed)
PATIENT HAS MADE AN APPOINTMENT TO HAVE HER A1-C RECHECKED ON THURS. (06/01/17) AT 2:40pm. SHE WOULD LIKE DR. SHAW TO PUT IN A LAB ORDER SO THAT SHE CAN COME IN FIRST THING ON THURS. THEN SHE CAN GO AHEAD AN EAT AND THEN COME BACK IN THE AFTERNOON.  BEST PHONE [phone] (CELL) PLEASE CALL TO LET HER KNOW THAT IT HAS BEEN DONE. [place]

## 2017-05-25 ENCOUNTER — Other Ambulatory Visit: Payer: Self-pay | Admitting: Family Medicine

## 2017-05-25 NOTE — Telephone Encounter (Signed)
Pt message re: Labs sent to Dr. Elmer Sow Priority

## 2017-05-25 NOTE — Telephone Encounter (Signed)
Labs entered.

## 2017-06-01 ENCOUNTER — Encounter: Payer: Self-pay | Admitting: Family Medicine

## 2017-06-01 ENCOUNTER — Ambulatory Visit (INDEPENDENT_AMBULATORY_CARE_PROVIDER_SITE_OTHER): Payer: 59 | Admitting: Family Medicine

## 2017-06-01 VITALS — BP 122/62 | HR 113 | Temp 98.3°F | Resp 16 | Ht 59.5 in | Wt 165.4 lb

## 2017-06-01 DIAGNOSIS — E7800 Pure hypercholesterolemia, unspecified: Secondary | ICD-10-CM

## 2017-06-01 DIAGNOSIS — E119 Type 2 diabetes mellitus without complications: Secondary | ICD-10-CM

## 2017-06-01 DIAGNOSIS — Z23 Encounter for immunization: Secondary | ICD-10-CM

## 2017-06-01 DIAGNOSIS — I493 Ventricular premature depolarization: Secondary | ICD-10-CM | POA: Diagnosis not present

## 2017-06-01 DIAGNOSIS — Z8349 Family history of other endocrine, nutritional and metabolic diseases: Secondary | ICD-10-CM

## 2017-06-01 DIAGNOSIS — R87610 Atypical squamous cells of undetermined significance on cytologic smear of cervix (ASC-US): Secondary | ICD-10-CM | POA: Diagnosis not present

## 2017-06-01 LAB — POCT GLYCOSYLATED HEMOGLOBIN (HGB A1C): Hemoglobin A1C: 6.7

## 2017-06-01 MED ORDER — METFORMIN HCL 850 MG PO TABS: 850.0000 mg | ORAL_TABLET | Freq: Two times a day (BID) | ORAL | 3 refills | Status: DC

## 2017-06-01 NOTE — Progress Notes (Signed)
Subjective:    Patient ID: Emily [name], female    DOB: Jun 09, 1961, 56 y.o.   MRN: [id number]  Chief Complaint  Patient presents with  . Follow-up    A1C recheck, TIIDM    HPI DMII: Diagnosed .   Lab Results  Component Value Date   HGBA1C 6.7 06/01/2017   HGBA1C 7.0 09/08/2016   HGBA1C 6.8 (H) 06/16/2016   CBGs: fasting a.m. >150s ; after meal lower during day  - lowest around 3-5 pm 115s-120s ; No hypoglycemic episodes. Taking the metformin right before bed. Eats a small snack before bed w/ a glass of milk to take her anti-arrhythmic which probably contributes to morning higher. Thinks she is going to ketogenesis overnight.   Meter type: One touch Diet:  Her step-mom passed away and she has been traveling so her diet has no been ideal Exercising: not as much due to above  DM Med Regimen: metformin 500 bid. She is taking it before breakfast and before bed but will move evening dose to before dinner.  Prior changes:   eGFR:  Baseline Cr:  Last checked . Microalb: Done . Normal. Not on acei Lipids:  LDL ,  non-HDL .  Last levels done  - were improving from prior. Prefers to stay off statin due to potential side effects and was told her veins were clear during her cath last years.  Taking asa 81 qd.  Optho: Seen annually by Dr. Leslie Andrea - last exam this summer - normal.  Feet: Monofilament exam done 09/02/16. Denies any no problems.  Not seen by podiatry prior.  Immunizations:  Influenza: today  Pneumovax-23: 2018  Past Medical History:  Diagnosis Date  . Allergy   . Chronic diastolic heart failure ([place]) 06/11/2015  . GERD (gastroesophageal reflux disease)   . NSVT (nonsustained ventricular tachycardia) ([place]) 06/13/2015  . PVC's (premature ventricular contractions) 06/11/2015  . Type 2 diabetes mellitus ([place])    Past Surgical History:  Procedure Laterality Date  . TONSILLECTOMY     when she was 3   Current Outpatient Medications on File Prior to Visit  Medication Sig  Dispense Refill  . acetaminophen (TYLENOL) 500 MG tablet Take 500 mg by mouth every 6 (six) hours as needed for moderate pain.    Marland Kitchen aspirin 81 MG tablet Take 81 mg by mouth daily.    . blood glucose meter kit and supplies KIT Dispense based on patient and insurance preference. Use up to four times daily as directed. (FOR ICD-9 250.00, 250.01). 1 each 0  . Blood Glucose Monitoring Suppl (ONE TOUCH ULTRA 2) w/Device KIT USE TO TEST UP TO 4 TIMES PER DAY. 1 each 1  . cholecalciferol (VITAMIN D) 1000 UNITS tablet Take 1,000 Units by mouth daily.    Marland Kitchen glucose blood (ACCU-CHEK AVIVA PLUS) test strip Use to test up to 4 times a day. 100 each 12  . ibuprofen (ADVIL,MOTRIN) 200 MG tablet Take 200 mg by mouth every 6 (six) hours as needed for mild pain. Reported on 10/26/2015    . Multiple Vitamins-Minerals (MULTIVITAMIN WITH MINERALS) tablet Take 1 tablet by mouth daily.    . nitroGLYCERIN (NITROSTAT) 0.4 MG SL tablet Place 1 tablet (0.4 mg total) under the tongue every 5 (five) minutes as needed for chest pain. 25 tablet 1  . omeprazole (PRILOSEC) 40 MG capsule TAKE 1 CAPSULE (40 MG TOTAL) BY MOUTH DAILY. 90 capsule 2  . propafenone (RYTHMOL) 150 MG tablet Take 1 tablet (150 mg total)  by mouth every 8 (eight) hours. 90 tablet 7  . thiamine (VITAMIN B-1) 100 MG tablet Take 100 mg by mouth daily.     No current facility-administered medications on file prior to visit.    Allergies  Allergen Reactions  . Hornet Venom Swelling  . Tetanus Toxoids Other (See Comments)    Big red rash and swelling in the muscle   Family History  Problem Relation Age of Onset  . Heart disease Mother   . COPD Mother   . Emphysema Mother   . Cancer Mother        oral  . Heart disease Father   . Diabetes Father   . COPD Father   . Vision loss Father   . Skin cancer Father        skin  . Hyperlipidemia Father   . Hypertension Father   . Heart disease Sister   . Graves' disease Sister   . Diabetes Sister   .  Hyperlipidemia Sister   . Heart failure Sister 74  . Heart disease Brother   . Hyperlipidemia Brother   . Cancer Paternal Grandmother        unknown  . Prostate cancer Paternal Grandfather        Prostate cancer   Social History   Socioeconomic History  . Marital status: Married    Spouse name: None  . Number of children: 5  . Years of education: None  . Highest education level: None  Social Needs  . Financial resource strain: None  . Food insecurity - worry: None  . Food insecurity - inability: None  . Transportation needs - medical: None  . Transportation needs - non-medical: None  Occupational History  . 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 Smoking status: Never Smoker  . Smokeless tobacco: Never Used  Substance and Sexual Activity  . Alcohol use: No  . Drug use: No  . Sexual activity: Yes  Other Topics Concern  . None  Social History Narrative   Married.  Education: [employer].         Depression screen [facility] 2/9 06/01/2017 09/08/2016 06/16/2016 06/11/2015 03/23/2015  Decreased Interest 0 0 0 0 0  Down, Depressed, Hopeless 0 0 0 1 0  PHQ - 2 Score 0 0 0 1 0      Review of Systems See hpi    Objective:   Physical Exam  Constitutional: She is oriented to person, place, and time. She appears well-developed and well-nourished. No distress.  HENT:  Head: Normocephalic and atraumatic.  Right Ear: External ear normal.  Left Ear: External ear normal.  Eyes: Conjunctivae are normal. No scleral icterus.  Neck: Normal range of motion. Neck supple. No thyromegaly present.  Cardiovascular: Normal rate, regular rhythm, normal heart sounds and intact distal pulses.  Pulmonary/Chest: Effort normal and breath sounds normal. No respiratory distress.  Musculoskeletal: She exhibits no edema.  Lymphadenopathy:    She has no cervical adenopathy.  Neurological: She is alert and oriented to person, place, and time.  Skin: Skin is warm and dry. She is not diaphoretic. No erythema.    Psychiatric: She has a normal mood and affect. Her behavior is normal.      BP 122/62   Pulse (!) 113   Temp 98.3 F (36.8 C)   Resp 16   Ht 4' 11.5" (1.511 m)   Wt 165 lb 6.4 oz (75 kg)   LMP 10/14/2014   SpO2 96%   BMI 32.85 kg/m  Results for orders placed or performed in visit on 06/01/17  POCT glycosylated hemoglobin (Hb A1C)  Result Value Ref Range   Hemoglobin A1C 6.7        Assessment & Plan:  If hgba1c is still high, then will need statin as LDL not at goal at 126.  1. Type 2 diabetes mellitus without complication, without long-term current use of insulin ([place])   2. PVC's (premature ventricular contractions)   3. Elevated LDL cholesterol level   4. Family history of thyroid disorder   5. Pap smear abnormality of cervix with ASCUS favoring benign   6. Need for prophylactic vaccination and inoculation against influenza     Orders Placed This Encounter  Procedures  . Flu Vaccine QUAD 6+ mos PF IM (Fluarix Quad PF)    Meds ordered this encounter  Medications  . metFORMIN (GLUCOPHAGE) 850 MG tablet    Sig: Take 1 tablet (850 mg total) by mouth 2 (two) times daily with a meal.    Dispense:  60 tablet    Refill:  3    Delman Cheadle, M.D.  Primary Care at [facility] [address] [place], [place] [address] [phone] phone [phone] fax  06/08/17 6:36 AM

## 2017-06-01 NOTE — Patient Instructions (Signed)
     IF you received an x-ray today, you will receive an invoice from [facility]. Please contact [facility] at [phone] with questions or concerns regarding your invoice.   IF you received labwork today, you will receive an invoice from [facility]. Please contact [facility] at [phone] with questions or concerns regarding your invoice.   Our billing staff will not be able to assist you with questions regarding bills from these companies.  You will be contacted with the lab results as soon as they are available. The fastest way to get your results is to activate your My Chart account. Instructions are located on the last page of this paperwork. If you have not heard from us regarding the results in 2 weeks, please contact this office.     

## 2017-06-02 LAB — LIPID PANEL
CHOLESTEROL TOTAL: 227 mg/dL — AB (ref 100–199)
Chol/HDL Ratio: 4.4 ratio (ref 0.0–4.4)
HDL: 52 mg/dL (ref >39)
LDL Calculated: 153 mg/dL — ABNORMAL HIGH (ref 0–99)
TRIGLYCERIDES: 108 mg/dL (ref 0–149)
VLDL CHOLESTEROL CAL: 22 mg/dL (ref 5–40)

## 2017-06-02 LAB — CBC
Hematocrit: 37.7 % (ref 34.0–46.6)
Hemoglobin: 13 g/dL (ref 11.1–15.9)
MCH: 29.1 pg (ref 26.6–33.0)
MCHC: 34.5 g/dL (ref 31.5–35.7)
MCV: 84 fL (ref 79–97)
PLATELETS: 211 10*3/uL (ref 150–379)
RBC: 4.47 x10E6/uL (ref 3.77–5.28)
RDW: 14.3 % (ref 12.3–15.4)
WBC: 5.8 10*3/uL (ref 3.4–10.8)

## 2017-06-02 LAB — COMPREHENSIVE METABOLIC PANEL
ALBUMIN: 4.4 g/dL (ref 3.5–5.5)
ALT: 19 IU/L (ref 0–32)
AST: 20 IU/L (ref 0–40)
Albumin/Globulin Ratio: 1.7 (ref 1.2–2.2)
Alkaline Phosphatase: 70 IU/L (ref 39–117)
BUN/Creatinine Ratio: 26 — ABNORMAL HIGH (ref 9–23)
BUN: 19 mg/dL (ref 6–24)
Bilirubin Total: 0.3 mg/dL (ref 0.0–1.2)
CALCIUM: 9.4 mg/dL (ref 8.7–10.2)
CO2: 28 mmol/L (ref 20–29)
CREATININE: 0.72 mg/dL (ref 0.57–1.00)
Chloride: 101 mmol/L (ref 96–106)
GFR calc Af Amer: 108 mL/min/{1.73_m2} (ref >59)
GFR, EST NON AFRICAN AMERICAN: 93 mL/min/{1.73_m2} (ref >59)
GLOBULIN, TOTAL: 2.6 g/dL (ref 1.5–4.5)
Glucose: 144 mg/dL — ABNORMAL HIGH (ref 65–99)
Potassium: 4.3 mmol/L (ref 3.5–5.2)
SODIUM: 143 mmol/L (ref 134–144)
TOTAL PROTEIN: 7 g/dL (ref 6.0–8.5)

## 2017-06-02 LAB — TSH: TSH: 1.98 u[IU]/mL (ref 0.450–4.500)

## 2017-07-03 ENCOUNTER — Ambulatory Visit (INDEPENDENT_AMBULATORY_CARE_PROVIDER_SITE_OTHER): Payer: 59 | Admitting: Family Medicine

## 2017-07-03 ENCOUNTER — Other Ambulatory Visit: Payer: Self-pay

## 2017-07-03 ENCOUNTER — Encounter: Payer: Self-pay | Admitting: Family Medicine

## 2017-07-03 VITALS — BP 118/62 | HR 102 | Temp 98.0°F | Resp 16 | Ht 59.5 in | Wt 165.4 lb

## 2017-07-03 DIAGNOSIS — E119 Type 2 diabetes mellitus without complications: Secondary | ICD-10-CM | POA: Diagnosis not present

## 2017-07-03 DIAGNOSIS — Z1239 Encounter for other screening for malignant neoplasm of breast: Secondary | ICD-10-CM

## 2017-07-03 DIAGNOSIS — Z0000 Encounter for general adult medical examination without abnormal findings: Secondary | ICD-10-CM | POA: Diagnosis not present

## 2017-07-03 DIAGNOSIS — R87610 Atypical squamous cells of undetermined significance on cytologic smear of cervix (ASC-US): Secondary | ICD-10-CM

## 2017-07-03 DIAGNOSIS — Z23 Encounter for immunization: Secondary | ICD-10-CM | POA: Diagnosis not present

## 2017-07-03 DIAGNOSIS — Z5181 Encounter for therapeutic drug level monitoring: Secondary | ICD-10-CM

## 2017-07-03 DIAGNOSIS — E7800 Pure hypercholesterolemia, unspecified: Secondary | ICD-10-CM | POA: Diagnosis not present

## 2017-07-03 DIAGNOSIS — Z1231 Encounter for screening mammogram for malignant neoplasm of breast: Secondary | ICD-10-CM

## 2017-07-03 LAB — POCT URINALYSIS DIP (MANUAL ENTRY)
BILIRUBIN UA: NEGATIVE
BILIRUBIN UA: NEGATIVE mg/dL
Blood, UA: NEGATIVE
GLUCOSE UA: NEGATIVE mg/dL
Nitrite, UA: NEGATIVE
PROTEIN UA: NEGATIVE mg/dL
Spec Grav, UA: 1.02 (ref 1.010–1.025)
Urobilinogen, UA: 0.2 E.U./dL
pH, UA: 5.5 (ref 5.0–8.0)

## 2017-07-03 NOTE — Patient Instructions (Addendum)
   IF you received an x-ray today, you will receive an invoice from [facility]. Please contact [facility] at [phone] with questions or concerns regarding your invoice.   IF you received labwork today, you will receive an invoice from [facility]. Please contact [facility] at [phone] with questions or concerns regarding your invoice.   Our billing staff will not be able to assist you with questions regarding bills from these companies.  You will be contacted with the lab results as soon as they are available. The fastest way to get your results is to activate your My Chart account. Instructions are located on the last page of this paperwork. If you have not heard from us regarding the results in 2 weeks, please contact this office.     Health Maintenance for Postmenopausal Women Menopause is a normal process in which your reproductive ability comes to an end. This process happens gradually over a span of months to years, usually between the ages of 48 and 55. Menopause is complete when you have missed 12 consecutive menstrual periods. It is important to talk with your health care provider about some of the most common conditions that affect postmenopausal women, such as heart disease, cancer, and bone loss (osteoporosis). Adopting a healthy lifestyle and getting preventive care can help to promote your health and wellness. Those actions can also lower your chances of developing some of these common conditions. What should I know about menopause? During menopause, you may experience a number of symptoms, such as:  Moderate-to-severe hot flashes.  Night sweats.  Decrease in sex drive.  Mood swings.  Headaches.  Tiredness.  Irritability.  Memory problems.  Insomnia.  Choosing to treat or not to treat menopausal changes is an individual decision that you make with your health care provider. What should I know about hormone replacement therapy and  supplements? Hormone therapy products are effective for treating symptoms that are associated with menopause, such as hot flashes and night sweats. Hormone replacement carries certain risks, especially as you become older. If you are thinking about using estrogen or estrogen with progestin treatments, discuss the benefits and risks with your health care provider. What should I know about heart disease and stroke? Heart disease, heart attack, and stroke become more likely as you age. This may be due, in part, to the hormonal changes that your body experiences during menopause. These can affect how your body processes dietary fats, triglycerides, and cholesterol. Heart attack and stroke are both medical emergencies. There are many things that you can do to help prevent heart disease and stroke:  Have your blood pressure checked at least every 1-2 years. High blood pressure causes heart disease and increases the risk of stroke.  If you are 55-79 years old, ask your health care provider if you should take aspirin to prevent a heart attack or a stroke.  Do not use any tobacco products, including cigarettes, chewing tobacco, or electronic cigarettes. If you need help quitting, ask your health care provider.  It is important to eat a healthy diet and maintain a healthy weight. ? Be sure to include plenty of vegetables, fruits, low-fat dairy products, and lean protein. ? Avoid eating foods that are high in solid fats, added sugars, or salt (sodium).  Get regular exercise. This is one of the most important things that you can do for your health. ? Try to exercise for at least 150 minutes each week. The type of exercise that you do should increase your   your heart rate and make you sweat. This is known as moderate-intensity exercise. ? Try to do strengthening exercises at least twice each week. Do these in addition to the moderate-intensity exercise.  Know your numbers.Ask your health care provider to check  your cholesterol and your blood glucose. Continue to have your blood tested as directed by your health care provider.  What should I know about cancer screening? There are several types of cancer. Take the following steps to reduce your risk and to catch any cancer development as early as possible. Breast Cancer  Practice breast self-awareness. ? This means understanding how your breasts normally appear and feel. ? It also means doing regular breast self-exams. Let your health care provider know about any changes, no matter how small.  If you are 42 or older, have a clinician do a breast exam (clinical breast exam or CBE) every year. Depending on your age, family history, and medical history, it may be recommended that you also have a yearly breast X-ray (mammogram).  If you have a family history of breast cancer, talk with your health care provider about genetic screening.  If you are at high risk for breast cancer, talk with your health care provider about having an MRI and a mammogram every year.  Breast cancer (BRCA) gene test is recommended for women who have family members with BRCA-related cancers. Results of the assessment will determine the need for genetic counseling and BRCA1 and for BRCA2 testing. BRCA-related cancers include these types: ? Breast. This occurs in males or females. ? Ovarian. ? Tubal. This may also be called fallopian tube cancer. ? Cancer of the abdominal or pelvic lining (peritoneal cancer). ? Prostate. ? Pancreatic.  Cervical, Uterine, and Ovarian Cancer Your health care provider may recommend that you be screened regularly for cancer of the pelvic organs. These include your ovaries, uterus, and vagina. This screening involves a pelvic exam, which includes checking for microscopic changes to the surface of your cervix (Pap test).  For women ages 21-65, health care providers may recommend a pelvic exam and a Pap test every three years. For women ages 77-65,  they may recommend the Pap test and pelvic exam, combined with testing for human papilloma virus (HPV), every five years. Some types of HPV increase your risk of cervical cancer. Testing for HPV may also be done on women of any age who have unclear Pap test results.  Other health care providers may not recommend any screening for nonpregnant women who are considered low risk for pelvic cancer and have no symptoms. Ask your health care provider if a screening pelvic exam is right for you.  If you have had past treatment for cervical cancer or a condition that could lead to cancer, you need Pap tests and screening for cancer for at least 20 years after your treatment. If Pap tests have been discontinued for you, your risk factors (such as having a new sexual partner) need to be reassessed to determine if you should start having screenings again. Some women have medical problems that increase the chance of getting cervical cancer. In these cases, your health care provider may recommend that you have screening and Pap tests more often.  If you have a family history of uterine cancer or ovarian cancer, talk with your health care provider about genetic screening.  If you have vaginal bleeding after reaching menopause, tell your health care provider.  There are currently no reliable tests available to screen for ovarian cancer.  Cancer Lung cancer screening is recommended for adults 55-80 years old who are at high risk for lung cancer because of a history of smoking. A yearly low-dose CT scan of the lungs is recommended if you:  Currently smoke.  Have a history of at least 30 pack-years of smoking and you currently smoke or have quit within the past 15 years. A pack-year is smoking an average of one pack of cigarettes per day for one year.  Yearly screening should:  Continue until it has been 15 years since you quit.  Stop if you develop a health problem that would prevent you from having lung  cancer treatment.  Colorectal Cancer  This type of cancer can be detected and can often be prevented.  Routine colorectal cancer screening usually begins at age 50 and continues through age 75.  If you have risk factors for colon cancer, your health care provider may recommend that you be screened at an earlier age.  If you have a family history of colorectal cancer, talk with your health care provider about genetic screening.  Your health care provider may also recommend using home test kits to check for hidden blood in your stool.  A small camera at the end of a tube can be used to examine your colon directly (sigmoidoscopy or colonoscopy). This is done to check for the earliest forms of colorectal cancer.  Direct examination of the colon should be repeated every 5-10 years until age 75. However, if early forms of precancerous polyps or small growths are found or if you have a family history or genetic risk for colorectal cancer, you may need to be screened more often.  Skin Cancer  Check your skin from head to toe regularly.  Monitor any moles. Be sure to tell your health care provider: ? About any new moles or changes in moles, especially if there is a change in a mole's shape or color. ? If you have a mole that is larger than the size of a pencil eraser.  If any of your family members has a history of skin cancer, especially at a young age, talk with your health care provider about genetic screening.  Always use sunscreen. Apply sunscreen liberally and repeatedly throughout the day.  Whenever you are outside, protect yourself by wearing long sleeves, pants, a wide-brimmed hat, and sunglasses.  What should I know about osteoporosis? Osteoporosis is a condition in which bone destruction happens more quickly than new bone creation. After menopause, you may be at an increased risk for osteoporosis. To help prevent osteoporosis or the bone fractures that can happen because of  osteoporosis, the following is recommended:  If you are 19-50 years old, get at least 1,000 mg of calcium and at least 600 mg of vitamin D per day.  If you are older than age 50 but younger than age 70, get at least 1,200 mg of calcium and at least 600 mg of vitamin D per day.  If you are older than age 70, get at least 1,200 mg of calcium and at least 800 mg of vitamin D per day.  Smoking and excessive alcohol intake increase the risk of osteoporosis. Eat foods that are rich in calcium and vitamin D, and do weight-bearing exercises several times each week as directed by your health care provider. What should I know about how menopause affects my mental health? Depression may occur at any age, but it is more common as you become older. Common symptoms of depression   include:  Low or sad mood.  Changes in sleep patterns.  Changes in appetite or eating patterns.  Feeling an overall lack of motivation or enjoyment of activities that you previously enjoyed.  Frequent crying spells.  Talk with your health care provider if you think that you are experiencing depression. What should I know about immunizations? It is important that you get and maintain your immunizations. These include:  Tetanus, diphtheria, and pertussis (Tdap) booster vaccine.  Influenza every year before the flu season begins.  Pneumonia vaccine.  Shingles vaccine.  Your health care provider may also recommend other immunizations. This information is not intended to replace advice given to you by your health care provider. Make sure you discuss any questions you have with your health care provider. Document Released: 09/09/2005 Document Revised: 02/05/2016 Document Reviewed: 04/21/2015 Elsevier Interactive Patient Education  2018 [employer]  

## 2017-07-03 NOTE — Progress Notes (Addendum)
Subjective:    Patient ID: [name], female    DOB: 1960-08-31, 56 y.o.   MRN: [id number] Chief Complaint  Patient presents with  . Annual Exam    HPI  Primary Preventative Screenings: Cervical Cancer: 06/16/2016 ASCUS with negative HR HPV so likely due to postmenopausal estrogen deficiency - repeat in 3 yrs - 06/2019. STI screening: pt declines Breast Cancer: 05/09/16 negative mam at [facility] - pt planning to make appt for January Colorectal Cancer: 09/2015 - Dr. Ardis Hughs - in 5 yrs Tobacco use/EtOH/substances: Bone Density: had one 10-15 yrs ago which was nml screen. Her sister has a bone density problem (but h/o graves disease and doesn't do calcium). Drinks a lot of milk - 2%.  Cardiac: Dr. Oval Linsey - EKG 12/2016 - follows every 6 mos. Weight/Blood sugar/Diet/Exercise: has been eating poorly as on the road to [place] to care for her father after her step mother's passing recently.  Thinking about starting a new diet in the new year - increase protein, decrease carbs - in a macronutrient.  Her daughter is expecting a kid in the new yr - in the [place] area. For weight-bearing exercise she does a lot laundry w/ 9 people and picking up the grandkinds BMI Readings from Last 3 Encounters:  06/01/17 32.85 kg/m  01/28/17 32.53 kg/m  01/09/17 32.73 kg/m   Lab Results  Component Value Date   HGBA1C 6.7 06/01/2017   OTC/Vit/Supp/Herbal: asa81, vit D 1000/d - last checked 2-3 yrs ago and was nml at 75. Mvi, B complex supplement - B helps it hemorrheoids -  B12 level slightly elevated  Dentist/Optho: optho seen 02/10/17 - [facility],  Dentist not this yr   Immunizations: REFUSES TETANUS BOOSTER AS REPORTS TETANUS TOXOID ALLERGY Immunization History  Administered Date(s) Administered  . Influenza,inj,Quad PF,6+ Mos 06/16/2016, 06/01/2017  . Pneumococcal Polysaccharide-23 09/08/2016  Very mild case of chicken pox as an adult. Has not had any shingles vaccine.   Chronic Medical  Conditions: DMII: Diagnosed .   RecentLabs       Lab Results  Component Value Date   HGBA1C 6.7 06/01/2017   HGBA1C 7.0 09/08/2016   HGBA1C 6.8 (H) 06/16/2016     CBGs: fasting a.m. ~150s - all lower than <160s fasting; after meal lower during day  - lowest around 3-5 pm 115s-120s - does feel tired and sweats more in the afternoon - does get better w/ a snack. Taking the metformin before dinner now. Eats a small snack before bed w/ a glass of milk to take her anti-arrhythmic which probably contributes to morning higher. Thinks she is going to ketogenesis overnight.              Meter type: One touch Diet:  Her step-mom passed away and she has been traveling so her diet has no been ideal Exercising: not as much due to above  DM Med Regimen: metformin 850 bid.  Prior changes: metformin 500 bid ->850 on 06/01/17  eGFR:  Baseline Cr:  Last checked . Microalb: Done . Normal. Not on acei Lipids:  LDL ,  non-HDL .  Last levels done  - were improving from prior. Prefers to stay off statin due to potential side effects and was told her veins were clear during her cath last years.  Taking asa 81 qd.  Optho: Seen annually by Dr. Leslie Andrea - last exam this summer - normal.  Feet: Monofilament exam done 09/02/16. Denies any no problems.  Not seen by  podiatry prior.  Immunizations:             Influenza: today             Pneumovax-23: 2018  HLD: wants to stay off of statin per cardiology GERD: omeprazole in the a.m. for the past 2 yrs - started when she was having angina. Will try to do a drug holiday after the holidays.   Past Medical History:  Diagnosis Date  . Allergy   . Chronic diastolic heart failure ([place]) 06/11/2015  . GERD (gastroesophageal reflux disease)   . NSVT (nonsustained ventricular tachycardia) ([place]) 06/13/2015  . PVC's (premature ventricular contractions) 06/11/2015  . Type 2 diabetes mellitus ([place])    Past Surgical History:  Procedure Laterality Date  . CARDIAC  CATHETERIZATION N/A 06/19/2015   Procedure: Left Heart Cath and Coronary Angiography;  Surgeon: Leonie Man, MD;  Location: [facility];  Service: Cardiovascular;  Laterality: N/A;  . TONSILLECTOMY     when she was 3   Current Outpatient Medications on File Prior to Visit  Medication Sig Dispense Refill  . acetaminophen (TYLENOL) 500 MG tablet Take 500 mg by mouth every 6 (six) hours as needed for moderate pain.    Marland Kitchen aspirin 81 MG tablet Take 81 mg by mouth daily.    . blood glucose meter kit and supplies KIT Dispense based on patient and insurance preference. Use up to four times daily as directed. (FOR ICD-9 250.00, 250.01). 1 each 0  . Blood Glucose Monitoring Suppl (ONE TOUCH ULTRA 2) w/Device KIT USE TO TEST UP TO 4 TIMES PER DAY. 1 each 1  . cholecalciferol (VITAMIN D) 1000 UNITS tablet Take 1,000 Units by mouth daily.    Marland Kitchen glucose blood (ACCU-CHEK AVIVA PLUS) test strip Use to test up to 4 times a day. 100 each 12  . ibuprofen (ADVIL,MOTRIN) 200 MG tablet Take 200 mg by mouth every 6 (six) hours as needed for mild pain. Reported on 10/26/2015    . metFORMIN (GLUCOPHAGE) 850 MG tablet Take 1 tablet (850 mg total) by mouth 2 (two) times daily with a meal. 60 tablet 3  . Multiple Vitamins-Minerals (MULTIVITAMIN WITH MINERALS) tablet Take 1 tablet by mouth daily.    . nitroGLYCERIN (NITROSTAT) 0.4 MG SL tablet Place 1 tablet (0.4 mg total) under the tongue every 5 (five) minutes as needed for chest pain. 25 tablet 1  . omeprazole (PRILOSEC) 40 MG capsule TAKE 1 CAPSULE (40 MG TOTAL) BY MOUTH DAILY. 90 capsule 2  . propafenone (RYTHMOL) 150 MG tablet Take 1 tablet (150 mg total) by mouth every 8 (eight) hours. 90 tablet 7  . thiamine (VITAMIN B-1) 100 MG tablet Take 100 mg by mouth daily.     No current facility-administered medications on file prior to visit.    Allergies  Allergen Reactions  . Hornet Venom Swelling  . Tetanus Toxoids Other (See Comments)    Big red rash and  swelling in the muscle   Family History  Problem Relation Age of Onset  . Heart disease Mother   . COPD Mother   . Emphysema Mother   . Cancer Mother        oral  . Heart disease Father   . Diabetes Father   . COPD Father   . Vision loss Father   . Skin cancer Father        skin  . Hyperlipidemia Father   . Hypertension Father   . Heart disease Sister   .  Graves' disease Sister   . Diabetes Sister   . Hyperlipidemia Sister   . Heart failure Sister 81  . Heart disease Brother   . Hyperlipidemia Brother   . Cancer Paternal Grandmother        unknown  . Prostate cancer Paternal Grandfather        Prostate cancer  Ms. Illingworth's mother died of a myocardial infarction at age 76. Her sister had peripartum cardiopathy at age 37. family history is significant for her sister with a history of graves disease. Her step mom passed away so she just came back from checking on her dad  Social History   Socioeconomic History  . Marital status: Married    Spouse name: None  . Number of children: 5  . Years of education: None  . Highest education level: None  Social Needs  . Financial resource strain: None  . Food insecurity - worry: None  . Food insecurity - inability: None  . Transportation needs - medical: None  . Transportation needs - non-medical: None  Occupational History  . 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 Smoking status: Never Smoker  . Smokeless tobacco: Never Used  Substance and Sexual Activity  . Alcohol use: No  . Drug use: No  . Sexual activity: Yes  Other Topics Concern  . None  Social History Narrative   Married.  Education: [employer].         Depression screen [facility] 2/9 07/03/2017 06/01/2017 09/08/2016 06/16/2016 06/11/2015  Decreased Interest 0 0 0 0 0  Down, Depressed, Hopeless 0 0 0 0 1  PHQ - 2 Score 0 0 0 0 1    Review of Systems  Constitutional: Positive for fatigue.  Eyes: Positive for itching.  Respiratory: Positive for cough.   Gastrointestinal:  Positive for diarrhea.  Musculoskeletal: Positive for neck stiffness.  All other systems reviewed and are negative.  See hpi    Objective:   Physical Exam  Constitutional: She is oriented to person, place, and time. She appears well-developed and well-nourished. No distress.  HENT:  Head: Normocephalic and atraumatic.  Right Ear: Tympanic membrane, external ear and ear canal normal.  Left Ear: Tympanic membrane, external ear and ear canal normal.  Nose: Nose normal. No mucosal edema or rhinorrhea.  Mouth/Throat: Uvula is midline, oropharynx is clear and moist and mucous membranes are normal. No posterior oropharyngeal erythema.  Eyes: Conjunctivae and EOM are normal. Pupils are equal, round, and reactive to light. Right eye exhibits no discharge. Left eye exhibits no discharge. No scleral icterus.  Neck: Normal range of motion. Neck supple. No thyromegaly present.  Cardiovascular: Normal rate, regular rhythm, normal heart sounds and intact distal pulses.  Pulmonary/Chest: Effort normal and breath sounds normal. No respiratory distress.  Abdominal: Soft. Bowel sounds are normal. There is no tenderness.  Genitourinary: No breast swelling, tenderness, discharge or bleeding.  Musculoskeletal: She exhibits no edema.  Lymphadenopathy:    She has no cervical adenopathy.  Neurological: She is alert and oriented to person, place, and time. She has normal reflexes.  Skin: Skin is warm and dry. She is not diaphoretic. No erythema.  Psychiatric: She has a normal mood and affect. Her behavior is normal.     Visual Acuity Screening   Right eye Left eye Both eyes  Without correction:     With correction: '20/15 20/20 20/13 '    Diabetic Foot Exam - Simple   Simple Foot Form Diabetic Foot exam was performed with the following findings:  Yes 07/03/2017  2:48 PM  Visual Inspection No deformities, no ulcerations, no other skin breakdown bilaterally:  Yes Sensation Testing Intact to touch and  monofilament testing bilaterally:  Yes Pulse Check Posterior Tibialis and Dorsalis pulse intact bilaterally:  Yes Comments     BP 118/62   Pulse (!) 102   Temp 98 F (36.7 C)   Resp 16   Ht 4' 11.5" (1.511 m)   Wt 165 lb 6.4 oz (75 kg)   LMP 10/14/2014   SpO2 97%   BMI 32.85 kg/m      Assessment & Plan:  sched mammogram by next Oct - likes to get mam every 2 yrs as no FHx  1. Annual physical exam   2. Type 2 diabetes mellitus without complication, without long-term current use of insulin (HCC) - going to work more on tlc - poor diet recently due to traveling/funeral.  3. Elevated LDL cholesterol level - refuses statin due to concern  About side effects and cardiology told her after her cath that her arteries were "clear"  4. Pap smear abnormality of cervix with ASCUS favoring benign - likely due to atrophy from estrogen loss - repeat pap AND HPV 06/2019 per ASCCP guidelines.  5. Medication monitoring encounter   6. Need for immunization against vaccinia virus infection   7. Screening for breast cancer     Orders Placed This Encounter  Procedures  . Comprehensive metabolic panel  . Lipid panel  . Microalbumin/Creatinine Ratio, Urine  . Vitamin B12  . Varicella zoster antibody, IgG  . Varicella zoster antibody, IgG  . Vitamin B12  . POCT urinalysis dipstick  . HM DIABETES FOOT EXAM     Delman Cheadle, M.D.  Primary Care at [facility] [address] [place], [place] [address] [phone] phone [phone] fax  07/10/17 10:38 AM

## 2017-07-04 ENCOUNTER — Other Ambulatory Visit: Payer: Self-pay | Admitting: Family Medicine

## 2017-07-04 DIAGNOSIS — Z1231 Encounter for screening mammogram for malignant neoplasm of breast: Secondary | ICD-10-CM

## 2017-07-04 LAB — COMPREHENSIVE METABOLIC PANEL
A/G RATIO: 1.8 (ref 1.2–2.2)
ALK PHOS: 69 IU/L (ref 39–117)
ALT: 23 IU/L (ref 0–32)
AST: 22 IU/L (ref 0–40)
Albumin: 4.5 g/dL (ref 3.5–5.5)
BUN/Creatinine Ratio: 24 — ABNORMAL HIGH (ref 9–23)
BUN: 17 mg/dL (ref 6–24)
Bilirubin Total: 0.3 mg/dL (ref 0.0–1.2)
CALCIUM: 9.4 mg/dL (ref 8.7–10.2)
CO2: 25 mmol/L (ref 20–29)
CREATININE: 0.71 mg/dL (ref 0.57–1.00)
Chloride: 101 mmol/L (ref 96–106)
GFR calc Af Amer: 109 mL/min/{1.73_m2} (ref >59)
GFR, EST NON AFRICAN AMERICAN: 95 mL/min/{1.73_m2} (ref >59)
GLOBULIN, TOTAL: 2.5 g/dL (ref 1.5–4.5)
Glucose: 140 mg/dL — ABNORMAL HIGH (ref 65–99)
POTASSIUM: 4.1 mmol/L (ref 3.5–5.2)
SODIUM: 141 mmol/L (ref 134–144)
Total Protein: 7 g/dL (ref 6.0–8.5)

## 2017-07-04 LAB — MICROALBUMIN / CREATININE URINE RATIO: CREATININE, UR: 47.1 mg/dL

## 2017-07-04 LAB — LIPID PANEL
CHOLESTEROL TOTAL: 210 mg/dL — AB (ref 100–199)
Chol/HDL Ratio: 4.2 ratio (ref 0.0–4.4)
HDL: 50 mg/dL (ref >39)
LDL Calculated: 133 mg/dL — ABNORMAL HIGH (ref 0–99)
TRIGLYCERIDES: 135 mg/dL (ref 0–149)
VLDL Cholesterol Cal: 27 mg/dL (ref 5–40)

## 2017-07-04 LAB — VARICELLA ZOSTER ANTIBODY, IGG: Varicella zoster IgG: 1969 index (ref >165)

## 2017-07-04 LAB — VITAMIN B12: Vitamin B-12: 645 pg/mL (ref 232–1245)

## 2017-07-10 ENCOUNTER — Ambulatory Visit: Payer: 59 | Admitting: Cardiovascular Disease

## 2017-07-10 ENCOUNTER — Other Ambulatory Visit: Payer: Self-pay | Admitting: Cardiovascular Disease

## 2017-07-10 DIAGNOSIS — I493 Ventricular premature depolarization: Secondary | ICD-10-CM

## 2017-07-11 MED ORDER — ZOSTER VAC RECOMB ADJUVANTED 50 MCG/0.5ML IM SUSR: 0.5000 mL | Freq: Once | INTRAMUSCULAR | 1 refills | Status: AC

## 2017-07-11 NOTE — Addendum Note (Signed)
Addended by: [name] on: 07/11/2017 02:06 PM   Modules accepted: Orders

## 2017-07-12 ENCOUNTER — Other Ambulatory Visit: Payer: Self-pay | Admitting: Family Medicine

## 2017-07-20 ENCOUNTER — Ambulatory Visit: Payer: 59 | Admitting: Cardiovascular Disease

## 2017-07-20 ENCOUNTER — Encounter: Payer: Self-pay | Admitting: Cardiovascular Disease

## 2017-07-20 VITALS — BP 100/64 | HR 97 | Ht 59.0 in | Wt 166.8 lb

## 2017-07-20 DIAGNOSIS — E7800 Pure hypercholesterolemia, unspecified: Secondary | ICD-10-CM | POA: Diagnosis not present

## 2017-07-20 DIAGNOSIS — R072 Precordial pain: Secondary | ICD-10-CM

## 2017-07-20 DIAGNOSIS — E6609 Other obesity due to excess calories: Secondary | ICD-10-CM | POA: Diagnosis not present

## 2017-07-20 DIAGNOSIS — I493 Ventricular premature depolarization: Secondary | ICD-10-CM

## 2017-07-20 MED ORDER — PRAVASTATIN SODIUM 10 MG PO TABS: 10.0000 mg | ORAL_TABLET | Freq: Every day | ORAL | 11 refills | Status: DC

## 2017-07-20 NOTE — Addendum Note (Signed)
Addended by: Alvina Filbert B on: 07/20/2017 09:49 AM   Modules accepted: Orders

## 2017-07-20 NOTE — Progress Notes (Signed)
Cardiology Office Note   Date:  07/20/2017   ID:  Emily [name], DOB 1961-06-12, MRN [id number]  PCP:  [name], MD  Cardiologist:   Skeet Latch, MD   No chief complaint on file.   Patient ID: Emily [name] is a 56 y.o. female with diabetes, hyperlipidemia and family history of premature CAD here for follow up.  Emily [name] was initially seen 05/2015 for chest pain, shortness of breath and palpitations.  She had an ETT on 05/15/15 that was negative for ischemia.  Echo showed LVEF 55-60% with probably hypokinesis of the basal-mid inferolateral and inferior myocardium and grade 1 diastolic dysfunction. She subsequently underwent LHC, which showed no CAD.  Emily [name] also wore a 48 hour Holter that showed frequent PVCs and occasional PACs.  She was started on metoprolol but reported feeling depressed at her follow up appointment.  Metoprolol was discontinued and she was started on propafenone.  This has controlled her PVCs well.  At her last appointment Emily [name] had gained 11 lb.  She was advised to work on diet and exercise.  Since that time her mother passed away unexpectedly.  She has been feeling very stressed and reports some episodes of chest pressure when talking about her mom.  She has no exertional chest pain or shortness of breath.  She exertion.  She deniesalso noted more palpitations during that time.  She has started back walking for exercise daily and feels good with lower extremity edema, orthopnea, or PND.  Overall she is feeling well physically.   Past Medical History:  Diagnosis Date  . Allergy   . Chronic diastolic heart failure ([place]) 06/11/2015  . GERD (gastroesophageal reflux disease)   . NSVT (nonsustained ventricular tachycardia) ([place]) 06/13/2015  . PVC's (premature ventricular contractions) 06/11/2015  . Type 2 diabetes mellitus ([place])     Past Surgical History:  Procedure Laterality Date  . CARDIAC CATHETERIZATION N/A 06/19/2015   Procedure: Left Heart Cath  and Coronary Angiography;  Surgeon: Leonie Man, MD;  Location: [facility];  Service: Cardiovascular;  Laterality: N/A;  . TONSILLECTOMY     when she was 3     Current Outpatient Medications  Medication Sig Dispense Refill  . acetaminophen (TYLENOL) 500 MG tablet Take 500 mg by mouth every 6 (six) hours as needed for moderate pain.    Marland Kitchen aspirin 81 MG tablet Take 81 mg by mouth daily.    . blood glucose meter kit and supplies KIT Dispense based on patient and insurance preference. Use up to four times daily as directed. (FOR ICD-9 250.00, 250.01). 1 each 0  . Blood Glucose Monitoring Suppl (ONE TOUCH ULTRA 2) w/Device KIT USE TO TEST UP TO 4 TIMES PER DAY. 1 each 1  . cholecalciferol (VITAMIN D) 1000 UNITS tablet Take 1,000 Units by mouth daily.    Marland Kitchen glucose blood (ONE TOUCH ULTRA TEST) test strip USE TO TEST UP TO 4 TIMES A DAY. 100 each 11  . ibuprofen (ADVIL,MOTRIN) 200 MG tablet Take 200 mg by mouth every 6 (six) hours as needed for mild pain. Reported on 10/26/2015    . metFORMIN (GLUCOPHAGE) 850 MG tablet Take 1 tablet (850 mg total) by mouth 2 (two) times daily with a meal. 60 tablet 3  . Multiple Vitamins-Minerals (MULTIVITAMIN WITH MINERALS) tablet Take 1 tablet by mouth daily.    . nitroGLYCERIN (NITROSTAT) 0.4 MG SL tablet Place 1 tablet (0.4 mg total) under the tongue every  5 (five) minutes as needed for chest pain. 25 tablet 1  . omeprazole (PRILOSEC) 40 MG capsule TAKE 1 CAPSULE (40 MG TOTAL) BY MOUTH DAILY. 90 capsule 2  . propafenone (RYTHMOL) 150 MG tablet Take 1 tablet (150 mg total) by mouth every 8 (eight) hours. 90 tablet 6  . thiamine (VITAMIN B-1) 100 MG tablet Take 100 mg by mouth daily.    . pravastatin (PRAVACHOL) 10 MG tablet Take 1 tablet (10 mg total) by mouth daily. 30 tablet 11   No current facility-administered medications for this visit.     Allergies:   Hornet venom and Tetanus toxoids    Social History:  The patient  reports that  has never  smoked. she has never used smokeless tobacco. She reports that she does not drink alcohol or use drugs.   Family History:  [name] family history includes COPD in her father and mother; Cancer in her mother and paternal grandmother; Diabetes in her father and sister; Emphysema in her mother; [name]' disease in her sister; Heart disease in her brother, father, mother, and sister; Heart failure (age of onset: 32) in her sister; Hyperlipidemia in her brother, father, and sister; Hypertension in her father; Prostate cancer in her paternal grandfather; Skin cancer in her father; Vision loss in her father.    ROS:  Please see the history of present illness.   Otherwise, review of systems are positive for neck stiffness, fatigue.   All other systems are reviewed and negative. Problem Dr. Orene Desanctis  PHYSICAL EXAM: VS:  BP 100/64   Pulse 97   Ht 4' 11" (1.499 m)   Wt 166 lb 12.8 oz (75.7 kg)   LMP 10/14/2014   BMI 33.69 kg/m  , BMI Body mass index is 33.69 kg/m. GENERAL:  Well appearing.  No acute distress.  HEENT: Pupils equal round and reactive, fundi not visualized, oral mucosa unremarkable NECK:  No jugular venous distention, waveform within normal limits, carotid upstroke brisk and symmetric, no bruits, no thyromegaly LUNGS:  Clear to auscultation bilaterally HEART:  RRR.  PMI not displaced or sustained,S1 and S2 within normal limits, no S3, no S4, no clicks, no rubs, no murmurs ABD:  Flat, positive bowel sounds normal in frequency in pitch, no bruits, no rebound, no guarding, no midline pulsatile mass, no hepatomegaly, no splenomegaly EXT:  2 plus pulses throughout, no edema, no cyanosis no clubbing SKIN:  No rashes no nodules NEURO:  Cranial nerves II through XII grossly intact, motor grossly intact throughout PSYCH:  Cognitively intact, oriented to person place and time     EKG:  EKG is ordered today. The ekg ordered 07/11/16 demonstrates sinus rhythm at 94 bpm. 01/09/17: Sinus  rhythm. Rate 91 bpm. 07/20/17: Sinus 97 bpm.  Low QRS  Echo 05/27/15: Study Conclusions  - Left ventricle: The cavity size was mildly dilated. Systolic  function was normal. The estimated ejection fraction was in the  range of 55% to 60%. Probable hypokinesis of the  basal-midinferolateral and inferior myocardium. There was an  increased relative contribution of atrial contraction to  ventricular filling. Doppler parameters are consistent with  abnormal left ventricular relaxation (grade 1 diastolic  dysfunction). - Aortic valve: Trileaflet; normal thickness, mildly calcified  leaflets. - Mitral valve: There was mild regurgitation.  ETT 05/15/15: Negative for ischemia.  LHC 06/19/15: 1. Angiographically normal coronary arteries 2. There is possible mild left ventricular systolic dysfunction - difficult to assess due to ectopy  48 hour Holter 05/04/15:  Quality:  Fair. Baseline artifact. Predominant rhythm: sinus Average heart rate: 92 bpm Max heart rate: 141 bpm Min heart rate: 59 bpm Pauses >2.5 seconds: 0 Ventricular ectopics: 1651 (1616 isolated, 4 couplets, 5 runs of NSVT, longest run 6 beats)  Percentage of ectopic beats: 0.65% Morphology: polymorphic Supraventricular ectopics: 18 Patient did not submit a symptom diary but triggered the monitor for one event, at which time sinus rhythm at 79 bpm was recorded.   Recent Labs: 06/01/2017: Hemoglobin 13.0; Platelets 211; TSH 1.980 07/03/2017: ALT 23; BUN 17; Creatinine, Ser 0.71; Potassium 4.1; Sodium 141    Lipid Panel    Component Value Date/Time   CHOL 210 (H) 07/03/2017 1439   TRIG 135 07/03/2017 1439   HDL 50 07/03/2017 1439   CHOLHDL 4.2 07/03/2017 1439   CHOLHDL 4.6 06/16/2016 1338   VLDL 34 (H) 06/16/2016 1338   LDLCALC 133 (H) 07/03/2017 1439   LDLDIRECT 129 (H) 07/27/2012 0809      Wt Readings from Last 3 Encounters:  07/20/17 166 lb 12.8 oz (75.7 kg)  07/03/17 165 lb 6.4 oz (75 kg)    06/01/17 165 lb 6.4 oz (75 kg)      Other studies Reviewed: Additional studies/ records that were reviewed today include: . Review of the above records demonstrates:  Please see elsewhere in the note.     ASSESSMENT AND PLAN:  # Hyperlipidemia: Emily [name] is willing to try a statin.  Appreciate Dr. Raul Del persistence with this recommendation.  She will try pravastatin 10 mg qhs.  Repeat lipids and CMP in 6 weeks.   # Obesity: Emily [name] was encouraged to keep working on diet and exercise.   # PVCs: Heart catheterization was negative for ischemia and LVEF is >55%.  Palpitations are well-controlled on propafenone.  Electrolytes stable 07/2017.   Current medicines are reviewed at length with the patient today.  The patient does not have concerns regarding medicines.  The following changes have been made: start pravastatin 10 mg   Labs/ tests ordered today include:   Orders Placed This Encounter  Procedures  . Lipid panel  . Comprehensive metabolic panel      Disposition:   FU with Lavida Patch C. Oval Linsey, MD in 1 year.    Signed, Skeet Latch, MD  07/20/2017 9:32 AM    [place]

## 2017-07-20 NOTE — Patient Instructions (Signed)
Medication Instructions:  START PRAVACHOL 10 MG DAILY   Labwork: FASTING LP/CMET 6 WEEKS   Testing/Procedures: NONE  Follow-Up: Your physician wants you to follow-up in: 1 [place] will receive a reminder letter in the mail two months in advance. If you don't receive a letter, please call our office to schedule the follow-up appointment.  If you need a refill on your cardiac medications before your next appointment, please call your pharmacy.

## 2017-08-04 ENCOUNTER — Ambulatory Visit: Payer: 59

## 2017-09-18 ENCOUNTER — Other Ambulatory Visit: Payer: Self-pay | Admitting: Family Medicine

## 2017-09-19 ENCOUNTER — Telehealth: Payer: Self-pay | Admitting: *Deleted

## 2017-09-19 NOTE — Telephone Encounter (Signed)
Patient was seen in December, started on Pravastatin, and to have follow up labs in 6 weeks. Called patient since no labs done. She will try to come next couple of days.

## 2017-09-28 LAB — COMPREHENSIVE METABOLIC PANEL
A/G RATIO: 1.9 (ref 1.2–2.2)
ALT: 23 IU/L (ref 0–32)
AST: 22 IU/L (ref 0–40)
Albumin: 4.4 g/dL (ref 3.5–5.5)
Alkaline Phosphatase: 69 IU/L (ref 39–117)
BUN / CREAT RATIO: 28 — AB (ref 9–23)
BUN: 19 mg/dL (ref 6–24)
Bilirubin Total: 0.3 mg/dL (ref 0.0–1.2)
CALCIUM: 9.4 mg/dL (ref 8.7–10.2)
CO2: 24 mmol/L (ref 20–29)
Chloride: 105 mmol/L (ref 96–106)
Creatinine, Ser: 0.69 mg/dL (ref 0.57–1.00)
GFR calc Af Amer: 111 mL/min/{1.73_m2} (ref >59)
GFR, EST NON AFRICAN AMERICAN: 96 mL/min/{1.73_m2} (ref >59)
GLOBULIN, TOTAL: 2.3 g/dL (ref 1.5–4.5)
Glucose: 135 mg/dL — ABNORMAL HIGH (ref 65–99)
POTASSIUM: 4.8 mmol/L (ref 3.5–5.2)
SODIUM: 144 mmol/L (ref 134–144)
Total Protein: 6.7 g/dL (ref 6.0–8.5)

## 2017-09-28 LAB — LIPID PANEL
CHOLESTEROL TOTAL: 177 mg/dL (ref 100–199)
Chol/HDL Ratio: 4 ratio (ref 0.0–4.4)
HDL: 44 mg/dL (ref >39)
LDL CALC: 101 mg/dL — AB (ref 0–99)
TRIGLYCERIDES: 160 mg/dL — AB (ref 0–149)
VLDL Cholesterol Cal: 32 mg/dL (ref 5–40)

## 2018-01-28 ENCOUNTER — Other Ambulatory Visit: Payer: Self-pay | Admitting: Family Medicine

## 2018-02-12 ENCOUNTER — Other Ambulatory Visit: Payer: Self-pay | Admitting: Gastroenterology

## 2018-02-20 ENCOUNTER — Other Ambulatory Visit: Payer: Self-pay | Admitting: Cardiovascular Disease

## 2018-02-20 DIAGNOSIS — I493 Ventricular premature depolarization: Secondary | ICD-10-CM

## 2018-02-26 ENCOUNTER — Other Ambulatory Visit: Payer: Self-pay | Admitting: Family Medicine

## 2018-02-27 NOTE — Telephone Encounter (Signed)
Metformin refill Last Refill:01/29/18 # 60 Last OV: 07/03/17 PCP: Delman Cheadle MD Pharmacy:[facility] HGB A1C: 06/01/17: 6.7

## 2018-03-26 ENCOUNTER — Other Ambulatory Visit: Payer: Self-pay | Admitting: Cardiovascular Disease

## 2018-03-26 DIAGNOSIS — I493 Ventricular premature depolarization: Secondary | ICD-10-CM

## 2018-03-28 ENCOUNTER — Other Ambulatory Visit: Payer: Self-pay | Admitting: Family Medicine

## 2018-04-23 ENCOUNTER — Other Ambulatory Visit: Payer: Self-pay | Admitting: Family Medicine

## 2018-04-23 NOTE — Telephone Encounter (Signed)
Pt called and notified that appt was needed in order to continue to receive refills of Metformin. Pt had to end call due to static on cell phone and will call the office back to have appt scheduled. Pt was due for follow up in 11/2017  Last OV: 07/03/17 PCP: Dr. Brigitte Pulse

## 2018-04-24 ENCOUNTER — Other Ambulatory Visit: Payer: Self-pay | Admitting: Cardiovascular Disease

## 2018-04-24 DIAGNOSIS — I493 Ventricular premature depolarization: Secondary | ICD-10-CM

## 2018-04-25 ENCOUNTER — Encounter: Payer: Self-pay | Admitting: Family Medicine

## 2018-04-25 ENCOUNTER — Ambulatory Visit: Payer: 59 | Admitting: Family Medicine

## 2018-04-25 ENCOUNTER — Other Ambulatory Visit: Payer: Self-pay

## 2018-04-25 VITALS — BP 127/77 | HR 101 | Temp 98.4°F | Resp 18 | Ht 59.69 in | Wt 169.0 lb

## 2018-04-25 DIAGNOSIS — Z23 Encounter for immunization: Secondary | ICD-10-CM | POA: Diagnosis not present

## 2018-04-25 DIAGNOSIS — E119 Type 2 diabetes mellitus without complications: Secondary | ICD-10-CM | POA: Diagnosis not present

## 2018-04-25 LAB — POCT GLYCOSYLATED HEMOGLOBIN (HGB A1C): Hemoglobin A1C: 6.5 % — AB (ref 4.0–5.6)

## 2018-04-25 MED ORDER — METFORMIN HCL 850 MG PO TABS: ORAL_TABLET | ORAL | 1 refills | Status: DC

## 2018-04-25 NOTE — Progress Notes (Signed)
Patient ID: Emily [name], female    DOB: Aug 15, 1960  Age: 57 y.o. MRN: [id number]  Chief Complaint  Patient presents with  . Diabetes    f/u -  pt states A1C blood work    Subjective:   57 year old lady here for diabetic check.  She is generally doing fairly well.  She stays at home with her children and has 2 grandchildren in the home currently.  She does not do a lot of regular exercise because she has diastolic heart failure and cannot exert well.  She sees a cardiologist regularly.  She takes her medications faithfully.  She sees an eye doctor annually.  She does not smoke or drink.  She does have some exertional angina that is monitored by her cardiologist.  Current allergies, medications, problem list, past/family and social histories reviewed.  Objective:  BP 127/77   Pulse (!) 101   Temp 98.4 F (36.9 C) (Oral)   Resp 18   Ht 4' 11.69" (1.516 m)   Wt 169 lb (76.7 kg)   LMP 10/14/2014   SpO2 96%   BMI 33.35 kg/m   Pleasant lady, alert and oriented.  Overweight.  Throat clear.  Eyes PRL.  Neck supple without nodes.  Chest clear.  Heart regular without murmurs.  No edema.  Assessment & Plan:   Assessment: 1. Type 2 diabetes mellitus without complication, without long-term current use of insulin ([place])       Plan: 10 you current medication.  We will let her know the results of her labs.  Orders Placed This Encounter  Procedures  . Flu Vaccine QUAD 36+ mos IM  . Comprehensive metabolic panel  . Lipid panel  . Microalbumin, urine  . POCT glycosylated hemoglobin (Hb A1C)    Meds ordered this encounter  Medications  . metFORMIN (GLUCOPHAGE) 850 MG tablet    Sig: TAKE 1 TABLET BY MOUTH TWICE A DAY WITH A MEAL    Dispense:  180 tablet    Refill:  1     Results for orders placed or performed in visit on 04/25/18  POCT glycosylated hemoglobin (Hb A1C)  Result Value Ref Range   Hemoglobin A1C 6.5 (A) 4.0 - 5.6 %   HbA1c POC (<> result, manual entry)     HbA1c,  POC (prediabetic range)     HbA1c, POC (controlled diabetic range)         Patient Instructions    Try to adhere closely to a diabetic diet.  I recommend reading the American diabetes Association website which is diabetes.org.  There is a lot of good information on that site.  Try to get as much regular exercise as you can, as your cardiologist allows.  Continue taking the metformin regularly.  Continue seeing your eye doctor annually for a diabetic eye check.  Return in 3 to 4 months to follow-up with Dr. Brigitte Pulse.     If you have lab work done today you will be contacted with your lab results within the next 2 weeks.  If you have not heard from [place] then please contact us. The fastest way to get your results is to register for My Chart.   IF you received an x-ray today, you will receive an invoice from [facility]. Please contact [facility] at [phone] with questions or concerns regarding your invoice.   IF you received labwork today, you will receive an invoice from [facility]. Please contact [facility] at [phone] with questions or concerns regarding your invoice.  Our billing staff will not be able to assist you with questions regarding bills from these companies.  You will be contacted with the lab results as soon as they are available. The fastest way to get your results is to activate your My Chart account. Instructions are located on the last page of this paperwork. If you have not heard from [place] regarding the results in 2 weeks, please contact this office.        Return in about 3 months (around 07/25/2018), or Dr Brigitte Pulse.   Ruben Reason, MD 04/25/2018

## 2018-04-25 NOTE — Patient Instructions (Addendum)
  Try to adhere closely to a diabetic diet.  I recommend reading the American diabetes Association website which is diabetes.org.  There is a lot of good information on that site.  Try to get as much regular exercise as you can, as your cardiologist allows.  Continue taking the metformin regularly.  Continue seeing your eye doctor annually for a diabetic eye check.  Return in 3 to 4 months to follow-up with Dr. Brigitte Pulse.     If you have lab work done today you will be contacted with your lab results within the next 2 weeks.  If you have not heard from [place] then please contact us. The fastest way to get your results is to register for My Chart.   IF you received an x-ray today, you will receive an invoice from [facility]. Please contact [facility] at [phone] with questions or concerns regarding your invoice.   IF you received labwork today, you will receive an invoice from [place]. Please contact [facility] at [phone] with questions or concerns regarding your invoice.   Our billing staff will not be able to assist you with questions regarding bills from these companies.  You will be contacted with the lab results as soon as they are available. The fastest way to get your results is to activate your My Chart account. Instructions are located on the last page of this paperwork. If you have not heard from [place] regarding the results in 2 weeks, please contact this office.

## 2018-04-26 LAB — COMPREHENSIVE METABOLIC PANEL
ALT: 50 IU/L — AB (ref 0–32)
AST: 37 IU/L (ref 0–40)
Albumin/Globulin Ratio: 1.7 (ref 1.2–2.2)
Albumin: 4.8 g/dL (ref 3.5–5.5)
Alkaline Phosphatase: 78 IU/L (ref 39–117)
BILIRUBIN TOTAL: 0.6 mg/dL (ref 0.0–1.2)
BUN/Creatinine Ratio: 22 (ref 9–23)
BUN: 16 mg/dL (ref 6–24)
CO2: 23 mmol/L (ref 20–29)
Calcium: 9.7 mg/dL (ref 8.7–10.2)
Chloride: 100 mmol/L (ref 96–106)
Creatinine, Ser: 0.72 mg/dL (ref 0.57–1.00)
GFR calc Af Amer: 107 mL/min/{1.73_m2} (ref >59)
GFR, EST NON AFRICAN AMERICAN: 93 mL/min/{1.73_m2} (ref >59)
Globulin, Total: 2.8 g/dL (ref 1.5–4.5)
Glucose: 168 mg/dL — ABNORMAL HIGH (ref 65–99)
POTASSIUM: 4.2 mmol/L (ref 3.5–5.2)
Sodium: 144 mmol/L (ref 134–144)
TOTAL PROTEIN: 7.6 g/dL (ref 6.0–8.5)

## 2018-04-26 LAB — MICROALBUMIN, URINE: Microalbumin, Urine: 3.5 ug/mL

## 2018-04-26 LAB — LIPID PANEL
CHOL/HDL RATIO: 4.6 ratio — AB (ref 0.0–4.4)
CHOLESTEROL TOTAL: 236 mg/dL — AB (ref 100–199)
HDL: 51 mg/dL (ref >39)
LDL Calculated: 153 mg/dL — ABNORMAL HIGH (ref 0–99)
Triglycerides: 159 mg/dL — ABNORMAL HIGH (ref 0–149)
VLDL Cholesterol Cal: 32 mg/dL (ref 5–40)

## 2018-05-21 ENCOUNTER — Other Ambulatory Visit: Payer: Self-pay | Admitting: Cardiovascular Disease

## 2018-05-21 DIAGNOSIS — I493 Ventricular premature depolarization: Secondary | ICD-10-CM

## 2018-05-21 NOTE — Telephone Encounter (Signed)
Rx request sent to pharmacy.  

## 2018-06-04 ENCOUNTER — Ambulatory Visit: Payer: Self-pay | Admitting: *Deleted

## 2018-06-04 NOTE — Telephone Encounter (Signed)
Pt having some shortness of breath with wheezing and coughing since the last 2 weeks. Pt states sometimes the cough is productive. She denies having a cold.  She stated that she has diastolic congested heart failure. She denies having swelling or fever. She is talking in phrases, sob at rest and her heart rate is 115 right now. She said that it usually gets better by this time of the day. But not today. Per protocol, she needs to be seen in the ED at the nearest hospital.  She is baby siting her 32 month old grand baby right now and does not want to go. So will see if she can get someone to keep the baby when she goes.   Advised to call 911 if she gets more shortness of breath. Pt voiced understanding. Routing to PCP.                 Reason for Disposition . [1] MODERATE difficulty breathing (e.g., speaks in phrases, SOB even at rest, pulse 100-120) AND [2] NEW-onset or WORSE than normal  Answer Assessment - Initial Assessment Questions 1. RESPIRATORY STATUS: "Describe your breathing?" (e.g., wheezing, shortness of breath, unable to speak, severe coughing)      Wheezing a little, coughing, shortness of breath 2. ONSET: "When did this breathing problem begin?"      2 weeks ago 3. PATTERN "Does the difficult breathing come and go, or has it been constant since it started?"      Comes and goes 4. SEVERITY: "How bad is your breathing?" (e.g., mild, moderate, severe)    - MILD: No SOB at rest, mild SOB with walking, speaks normally in sentences, can lay down, no retractions, pulse < 100.    - MODERATE: SOB at rest, SOB with minimal exertion and prefers to sit, cannot lie down flat, speaks in phrases, mild retractions, audible wheezing, pulse 100-120.    - SEVERE: Very SOB at rest, speaks in single words, struggling to breathe, sitting hunched forward, retractions, pulse > 120      Moderate  5. RECURRENT SYMPTOM: "Have you had difficulty breathing before?" If so, ask: "When was the last time?" and  "What happened that time?"      no 6. CARDIAC HISTORY: "Do you have any history of heart disease?" (e.g., heart attack, angina, bypass surgery, angioplasty)      Diastolic heart failure, tachycardia 7. LUNG HISTORY: "Do you have any history of lung disease?"  (e.g., pulmonary embolus, asthma, emphysema)     no 8. CAUSE: "What do you think is causing the breathing problem?"      No  9. OTHER SYMPTOMS: "Do you have any other symptoms? (e.g., dizziness, runny nose, cough, chest pain, fever)     Cough, tightness from the cough 10. PREGNANCY: "Is there any chance you are pregnant?" "When was your last menstrual period?"       No periods 11. TRAVEL: "Have you traveled out of the country in the last month?" (e.g., travel history, exposures)       no  Protocols used: BREATHING DIFFICULTY-A-AH

## 2018-06-16 ENCOUNTER — Encounter (HOSPITAL_COMMUNITY): Payer: Self-pay

## 2018-06-16 ENCOUNTER — Other Ambulatory Visit: Payer: Self-pay

## 2018-06-16 ENCOUNTER — Emergency Department (HOSPITAL_COMMUNITY): Payer: 59

## 2018-06-16 ENCOUNTER — Observation Stay (HOSPITAL_COMMUNITY)
Admission: EM | Admit: 2018-06-16 | Discharge: 2018-06-21 | Disposition: A | Discharge status: Home / Self Care | Payer: 59 | Attending: Cardiovascular Disease | Admitting: Cardiovascular Disease

## 2018-06-16 DIAGNOSIS — Z7982 Long term (current) use of aspirin: Secondary | ICD-10-CM | POA: Diagnosis not present

## 2018-06-16 DIAGNOSIS — Z955 Presence of coronary angioplasty implant and graft: Secondary | ICD-10-CM | POA: Insufficient documentation

## 2018-06-16 DIAGNOSIS — I420 Dilated cardiomyopathy: Secondary | ICD-10-CM | POA: Insufficient documentation

## 2018-06-16 DIAGNOSIS — I472 Ventricular tachycardia: Secondary | ICD-10-CM | POA: Diagnosis not present

## 2018-06-16 DIAGNOSIS — E785 Hyperlipidemia, unspecified: Secondary | ICD-10-CM | POA: Diagnosis not present

## 2018-06-16 DIAGNOSIS — E1165 Type 2 diabetes mellitus with hyperglycemia: Secondary | ICD-10-CM | POA: Diagnosis not present

## 2018-06-16 DIAGNOSIS — Z79899 Other long term (current) drug therapy: Secondary | ICD-10-CM | POA: Diagnosis not present

## 2018-06-16 DIAGNOSIS — Z887 Allergy status to serum and vaccine status: Secondary | ICD-10-CM | POA: Diagnosis not present

## 2018-06-16 DIAGNOSIS — Z8249 Family history of ischemic heart disease and other diseases of the circulatory system: Secondary | ICD-10-CM | POA: Diagnosis not present

## 2018-06-16 DIAGNOSIS — I429 Cardiomyopathy, unspecified: Secondary | ICD-10-CM | POA: Insufficient documentation

## 2018-06-16 DIAGNOSIS — I252 Old myocardial infarction: Secondary | ICD-10-CM | POA: Insufficient documentation

## 2018-06-16 DIAGNOSIS — R739 Hyperglycemia, unspecified: Secondary | ICD-10-CM

## 2018-06-16 DIAGNOSIS — Z91030 Bee allergy status: Secondary | ICD-10-CM | POA: Insufficient documentation

## 2018-06-16 DIAGNOSIS — I110 Hypertensive heart disease with heart failure: Secondary | ICD-10-CM | POA: Diagnosis not present

## 2018-06-16 DIAGNOSIS — I5041 Acute combined systolic (congestive) and diastolic (congestive) heart failure: Secondary | ICD-10-CM

## 2018-06-16 DIAGNOSIS — Z9889 Other specified postprocedural states: Secondary | ICD-10-CM | POA: Insufficient documentation

## 2018-06-16 DIAGNOSIS — R0602 Shortness of breath: Principal | ICD-10-CM | POA: Insufficient documentation

## 2018-06-16 DIAGNOSIS — K219 Gastro-esophageal reflux disease without esophagitis: Secondary | ICD-10-CM | POA: Diagnosis not present

## 2018-06-16 DIAGNOSIS — I509 Heart failure, unspecified: Secondary | ICD-10-CM

## 2018-06-16 DIAGNOSIS — I4729 Other ventricular tachycardia: Secondary | ICD-10-CM

## 2018-06-16 DIAGNOSIS — Z6833 Body mass index (BMI) 33.0-33.9, adult: Secondary | ICD-10-CM | POA: Diagnosis not present

## 2018-06-16 DIAGNOSIS — E669 Obesity, unspecified: Secondary | ICD-10-CM | POA: Diagnosis present

## 2018-06-16 DIAGNOSIS — I493 Ventricular premature depolarization: Secondary | ICD-10-CM | POA: Insufficient documentation

## 2018-06-16 DIAGNOSIS — I340 Nonrheumatic mitral (valve) insufficiency: Secondary | ICD-10-CM | POA: Diagnosis present

## 2018-06-16 DIAGNOSIS — I5033 Acute on chronic diastolic (congestive) heart failure: Secondary | ICD-10-CM | POA: Diagnosis not present

## 2018-06-16 DIAGNOSIS — E7800 Pure hypercholesterolemia, unspecified: Secondary | ICD-10-CM | POA: Diagnosis present

## 2018-06-16 DIAGNOSIS — I051 Rheumatic mitral insufficiency: Secondary | ICD-10-CM | POA: Insufficient documentation

## 2018-06-16 DIAGNOSIS — I428 Other cardiomyopathies: Secondary | ICD-10-CM

## 2018-06-16 DIAGNOSIS — I5043 Acute on chronic combined systolic (congestive) and diastolic (congestive) heart failure: Secondary | ICD-10-CM | POA: Diagnosis present

## 2018-06-16 DIAGNOSIS — E119 Type 2 diabetes mellitus without complications: Secondary | ICD-10-CM

## 2018-06-16 HISTORY — DX: Other specified postprocedural states: Z98.890

## 2018-06-16 HISTORY — DX: Other cardiomyopathies: I42.8

## 2018-06-16 HISTORY — DX: Heart failure, unspecified: I50.9

## 2018-06-16 HISTORY — DX: Acute combined systolic (congestive) and diastolic (congestive) heart failure: I50.41

## 2018-06-16 LAB — I-STAT TROPONIN, ED: TROPONIN I, POC: 0.04 ng/mL (ref 0.00–0.08)

## 2018-06-16 LAB — PHOSPHORUS: PHOSPHORUS: 3.2 mg/dL (ref 2.5–4.6)

## 2018-06-16 LAB — BASIC METABOLIC PANEL
Anion gap: 11 (ref 5–15)
BUN: 18 mg/dL (ref 6–20)
CALCIUM: 9.4 mg/dL (ref 8.9–10.3)
CO2: 23 mmol/L (ref 22–32)
CREATININE: 0.68 mg/dL (ref 0.44–1.00)
Chloride: 107 mmol/L (ref 98–111)
Glucose, Bld: 193 mg/dL — ABNORMAL HIGH (ref 70–99)
Potassium: 4.1 mmol/L (ref 3.5–5.1)
SODIUM: 141 mmol/L (ref 135–145)

## 2018-06-16 LAB — CBC WITH DIFFERENTIAL/PLATELET
ABS IMMATURE GRANULOCYTES: 0.02 10*3/uL (ref 0.00–0.07)
BASOS ABS: 0 10*3/uL (ref 0.0–0.1)
BASOS PCT: 0 %
EOS PCT: 0 %
Eosinophils Absolute: 0 10*3/uL (ref 0.0–0.5)
HCT: 42.7 % (ref 36.0–46.0)
Hemoglobin: 13.8 g/dL (ref 12.0–15.0)
Immature Granulocytes: 0 %
LYMPHS ABS: 1.9 10*3/uL (ref 0.7–4.0)
LYMPHS PCT: 25 %
MCH: 29.7 pg (ref 26.0–34.0)
MCHC: 32.3 g/dL (ref 30.0–36.0)
MCV: 92 fL (ref 80.0–100.0)
MONO ABS: 0.5 10*3/uL (ref 0.1–1.0)
MONOS PCT: 6 %
NRBC: 0 % (ref 0.0–0.2)
Neutro Abs: 5.1 10*3/uL (ref 1.7–7.7)
Neutrophils Relative %: 69 %
PLATELETS: 278 10*3/uL (ref 150–400)
RBC: 4.64 MIL/uL (ref 3.87–5.11)
RDW: 14.1 % (ref 11.5–15.5)
WBC: 7.5 10*3/uL (ref 4.0–10.5)

## 2018-06-16 LAB — GLUCOSE, CAPILLARY
Glucose-Capillary: 134 mg/dL — ABNORMAL HIGH (ref 70–99)
Glucose-Capillary: 121 mg/dL — ABNORMAL HIGH (ref 70–99)

## 2018-06-16 LAB — MAGNESIUM: MAGNESIUM: 1.9 mg/dL (ref 1.7–2.4)

## 2018-06-16 LAB — TROPONIN I: Troponin I: <0.03 ng/mL (ref <0.03)

## 2018-06-16 LAB — BRAIN NATRIURETIC PEPTIDE: B Natriuretic Peptide: 417.2 pg/mL — ABNORMAL HIGH (ref 0.0–100.0)

## 2018-06-16 MED ORDER — ONDANSETRON HCL 4 MG/2ML IJ SOLN: 4.0000 mg | Freq: Four times a day (QID) | INTRAMUSCULAR | Status: DC | PRN

## 2018-06-16 MED ORDER — POTASSIUM CHLORIDE CRYS ER 20 MEQ PO TBCR
40.0000 meq | EXTENDED_RELEASE_TABLET | Freq: Once | ORAL | Status: AC
  Administered 2018-06-16: 40 meq via ORAL
  Filled 2018-06-16: qty 2

## 2018-06-16 MED ORDER — PROPAFENONE HCL 150 MG PO TABS
150.0000 mg | ORAL_TABLET | Freq: Three times a day (TID) | ORAL | Status: DC
  Administered 2018-06-16 – 2018-06-20 (×11): 150 mg via ORAL
  Filled 2018-06-16 (×13): qty 1

## 2018-06-16 MED ORDER — ONDANSETRON HCL 4 MG PO TABS: 4.0000 mg | ORAL_TABLET | Freq: Four times a day (QID) | ORAL | Status: DC | PRN

## 2018-06-16 MED ORDER — ADULT MULTIVITAMIN W/MINERALS CH
1.0000 | ORAL_TABLET | Freq: Every day | ORAL | Status: DC
  Administered 2018-06-17 – 2018-06-21 (×4): 1 via ORAL
  Filled 2018-06-16 (×7): qty 1

## 2018-06-16 MED ORDER — MAGNESIUM SULFATE 2 GM/50ML IV SOLN
2.0000 g | Freq: Once | INTRAVENOUS | Status: AC
  Administered 2018-06-16: 2 g via INTRAVENOUS
  Filled 2018-06-16: qty 50

## 2018-06-16 MED ORDER — IPRATROPIUM-ALBUTEROL 0.5-2.5 (3) MG/3ML IN SOLN: 3.0000 mL | Freq: Four times a day (QID) | RESPIRATORY_TRACT | Status: DC | PRN

## 2018-06-16 MED ORDER — INSULIN ASPART 100 UNIT/ML ~~LOC~~ SOLN
0.0000 [IU] | Freq: Three times a day (TID) | SUBCUTANEOUS | Status: DC
  Administered 2018-06-17 – 2018-06-18 (×2): 2 [IU] via SUBCUTANEOUS
  Administered 2018-06-18: 3 [IU] via SUBCUTANEOUS
  Administered 2018-06-19: 2 [IU] via SUBCUTANEOUS
  Administered 2018-06-19: 3 [IU] via SUBCUTANEOUS
  Administered 2018-06-20: 2 [IU] via SUBCUTANEOUS
  Administered 2018-06-20 – 2018-06-21 (×2): 3 [IU] via SUBCUTANEOUS
  Administered 2018-06-21: 2 [IU] via SUBCUTANEOUS

## 2018-06-16 MED ORDER — FAMOTIDINE 20 MG PO TABS
20.0000 mg | ORAL_TABLET | Freq: Two times a day (BID) | ORAL | Status: DC
  Administered 2018-06-16 – 2018-06-21 (×10): 20 mg via ORAL
  Filled 2018-06-16 (×11): qty 1

## 2018-06-16 MED ORDER — VITAMIN B-1 100 MG PO TABS
100.0000 mg | ORAL_TABLET | Freq: Every day | ORAL | Status: DC
  Administered 2018-06-17 – 2018-06-21 (×4): 100 mg via ORAL
  Filled 2018-06-16 (×7): qty 1

## 2018-06-16 MED ORDER — ASPIRIN EC 81 MG PO TBEC
81.0000 mg | DELAYED_RELEASE_TABLET | Freq: Every day | ORAL | Status: DC
  Administered 2018-06-17 – 2018-06-18 (×2): 81 mg via ORAL
  Filled 2018-06-16 (×2): qty 1

## 2018-06-16 MED ORDER — LISINOPRIL 5 MG PO TABS
2.5000 mg | ORAL_TABLET | Freq: Every day | ORAL | Status: DC
  Administered 2018-06-17 – 2018-06-21 (×4): 2.5 mg via ORAL
  Filled 2018-06-16 (×7): qty 1

## 2018-06-16 MED ORDER — ACETAMINOPHEN 325 MG PO TABS
650.0000 mg | ORAL_TABLET | Freq: Four times a day (QID) | ORAL | Status: DC | PRN
  Administered 2018-06-17: 650 mg via ORAL
  Filled 2018-06-16 (×2): qty 2

## 2018-06-16 MED ORDER — FUROSEMIDE 10 MG/ML IJ SOLN
40.0000 mg | Freq: Once | INTRAMUSCULAR | Status: AC
  Administered 2018-06-16: 40 mg via INTRAVENOUS
  Filled 2018-06-16: qty 4

## 2018-06-16 MED ORDER — FUROSEMIDE 10 MG/ML IJ SOLN
40.0000 mg | Freq: Two times a day (BID) | INTRAMUSCULAR | Status: AC
  Administered 2018-06-17 (×2): 40 mg via INTRAVENOUS
  Filled 2018-06-16 (×2): qty 4

## 2018-06-16 MED ORDER — ACETAMINOPHEN 650 MG RE SUPP: 650.0000 mg | Freq: Four times a day (QID) | RECTAL | Status: DC | PRN

## 2018-06-16 MED ORDER — METFORMIN HCL 850 MG PO TABS
850.0000 mg | ORAL_TABLET | Freq: Two times a day (BID) | ORAL | Status: DC
  Administered 2018-06-17 – 2018-06-18 (×4): 850 mg via ORAL
  Filled 2018-06-16 (×5): qty 1

## 2018-06-16 MED ORDER — ENOXAPARIN SODIUM 40 MG/0.4ML ~~LOC~~ SOLN
40.0000 mg | SUBCUTANEOUS | Status: DC
  Administered 2018-06-16 – 2018-06-18 (×3): 40 mg via SUBCUTANEOUS
  Filled 2018-06-16 (×3): qty 0.4

## 2018-06-16 MED ORDER — VITAMIN D 25 MCG (1000 UNIT) PO TABS
1000.0000 [IU] | ORAL_TABLET | Freq: Every day | ORAL | Status: DC
  Administered 2018-06-17 – 2018-06-21 (×4): 1000 [IU] via ORAL
  Filled 2018-06-16 (×6): qty 1

## 2018-06-16 MED ORDER — NITROGLYCERIN 0.4 MG SL SUBL: 0.4000 mg | SUBLINGUAL_TABLET | SUBLINGUAL | Status: DC | PRN

## 2018-06-16 MED ORDER — POTASSIUM CHLORIDE CRYS ER 20 MEQ PO TBCR
20.0000 meq | EXTENDED_RELEASE_TABLET | Freq: Two times a day (BID) | ORAL | Status: DC
  Administered 2018-06-16 – 2018-06-18 (×4): 20 meq via ORAL
  Filled 2018-06-16 (×4): qty 1

## 2018-06-16 NOTE — H&P (Signed)
History and Physical    Emily [name]:[id number] DOB: 17-Aug-1960 DOA: 06/16/2018  PCP: [name], MD   Patient coming from: Home[facility].  I have personally briefly reviewed patient's old medical records in [place]  Chief Complaint: Shortness of breath.  HPI: Emily [name] is a 57 y.o. female with medical history significant of seasonal allergies, chronic diastolic heart failure, GERD, and SVT, history of PVCs, type 2 diabetes mellitus who is coming to the emergency department due to progressively worse shortness of breath for the past 2-1/2 weeks with severe paroxysmal cough, which for the past few days has been increasing in frequency and severity.  However, she states that yesterday she had to stop and rest climbing the steps at home and doing her regular daily activities.  The cough has been mostly dry, but occasionally has clear sputum.  She has felt some chest pressure when she is very short of breath.  She has had edema on her fingers and how to take her wedding band off.  She has also had edema on her ankles. She denies fever, sore throat, rhinorrhea or headache.  She denies wheezing or hemoptysis.    She and her husband state that they have been under a lot of stress lately, have been eating fast food a lot more regularly than they normally do, and so high her sodium intake.  She denies abdominal pain, nausea, emesis, diarrhea, constipation, melena or hematochezia.  She denies dysuria, frequency or hematuria.  No polyuria, polydipsia, polyphagia or blurred vision.  However, her blood glucose have been slightly higher than usual, but never over 200 mg/dL.  Denies skin rashes or pruritus.  ED Course: Her temperature is 98 F, pulse 115, respirations 19 blood pressure 130/90 and O2 sat 98% when she arrived to the emergency department.  He received supplemental oxygen in 40 mg of furosemide IVP in the ED.  She has urinated multiple times and states that she feels a lot better.  Her  work-up shows a normal troponin, BNP was 417.2 pg/mL.  Normal CBC, her BMP shows a glucose of 193 mg/dL but is otherwise normal.  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 Please see tracing for further detail.  Echocardiogram from 3 years ago show an EF of 55% with grade 1 diastolic dysfunction.  Her chest radiograph today shows borderline cardiomegaly, mild pulmonary interstitial edema and small effusions.  Review of Systems: As per HPI otherwise 10 point review of systems negative.   Past Medical History:  Diagnosis Date  . Allergy   . CHF (congestive heart failure) ([place])   . Chronic diastolic heart failure ([place]) 06/11/2015  . GERD (gastroesophageal reflux disease)   . NSVT (nonsustained ventricular tachycardia) ([place]) 06/13/2015  . PVC's (premature ventricular contractions) 06/11/2015  . Type 2 diabetes mellitus ([place])     Past Surgical History:  Procedure Laterality Date  . CARDIAC CATHETERIZATION N/A 06/19/2015   Procedure: Left Heart Cath and Coronary Angiography;  Surgeon: Leonie Man, MD;  Location: [place] [facility];  Service: Cardiovascular;  Laterality: N/A;  . TONSILLECTOMY     when she was 3     reports that she has never smoked. She has never used smokeless tobacco. She reports that she does not drink alcohol or use drugs.  Allergies  Allergen Reactions  . Hornet Venom Swelling  . Tetanus Toxoids Other (See Comments)  Big red rash and swelling in the muscle    Family History  Problem Relation Age of Onset  . Heart disease Mother   . COPD Mother   . Emphysema Mother   . Cancer Mother        oral  . Heart disease Father   . Diabetes Father   . COPD Father   . Vision loss Father   . Skin cancer Father        skin  . Hyperlipidemia Father   . Hypertension Father   . Heart disease Sister   . Graves' disease Sister   . Diabetes Sister   . Hyperlipidemia Sister   .  Heart failure Sister 45  . Heart disease Brother   . Hyperlipidemia Brother   . Cancer Paternal Grandmother        unknown  . Prostate cancer Paternal Grandfather        Prostate cancer   Prior to Admission medications   Medication Sig Start Date End Date Taking? Authorizing Provider  acetaminophen (TYLENOL) 500 MG tablet Take 500 mg by mouth every 6 (six) hours as needed for moderate pain.   Yes [provider]  aspirin 81 MG tablet Take 81 mg by mouth daily.   Yes [provider]  cholecalciferol (VITAMIN D) 1000 UNITS tablet Take 1,000 Units by mouth daily.   Yes [provider]  ibuprofen (ADVIL,MOTRIN) 200 MG tablet Take 200 mg by mouth every 6 (six) hours as needed for mild pain. Reported on 10/26/2015   Yes [provider]  metFORMIN (GLUCOPHAGE) 850 MG tablet TAKE 1 TABLET BY MOUTH TWICE A DAY WITH A MEAL 04/25/18  Yes Posey Boyer, MD  Multiple Vitamins-Minerals (MULTIVITAMIN WITH MINERALS) tablet Take 1 tablet by mouth daily.   Yes [provider]  nitroGLYCERIN (NITROSTAT) 0.4 MG SL tablet Place 1 tablet (0.4 mg total) under the tongue every 5 (five) minutes as needed for chest pain. 11/08/16  Yes Skeet Latch, MD  propafenone (RYTHMOL) 150 MG tablet TAKE 1 TABLET BY MOUTH EVERY 8 HOURS 05/21/18  Yes Skeet Latch, MD  thiamine (VITAMIN B-1) 100 MG tablet Take 100 mg by mouth daily.   Yes [provider]  blood glucose meter kit and supplies KIT Dispense based on patient and insurance preference. Use up to four times daily as directed. (FOR ICD-9 250.00, 250.01). 06/20/16   [name], MD  Blood Glucose Monitoring Suppl (ONE TOUCH ULTRA 2) w/Device KIT USE TO TEST UP TO 4 TIMES PER DAY. 08/04/16   [name], MD  glucose blood (ONE TOUCH ULTRA TEST) test strip USE TO TEST UP TO 4 TIMES A DAY. 07/13/17   [name], MD    Physical Exam: Vitals:   06/16/18 1258 06/16/18 1330 06/16/18 1419 06/16/18 1700  BP: 130/90  126/82 111/84 109/71  Pulse: (!) 115  (!) 110 100  Resp: _0 (!) 21  Temp: 98 F (36.7 C)     TempSrc: Oral     SpO2: 98%  95% 94%  Weight: 77.1 kg     Height: 4' 11.5" (1.511 m)       Constitutional: NAD, calm, comfortable Eyes: PERRL, lids and conjunctivae normal ENMT: Mucous membranes are moist. Posterior pharynx clear of any exudate or lesions. Neck: normal, supple, no masses, no thyromegaly Respiratory: Bibasilar rales, no wheezing or rhonchi.. Normal respiratory effort. No accessory muscle use.  Cardiovascular: Regular rate and rhythm, no murmurs / rubs /  gallops.  Trace pitting edema of ankles and fingers.  Edema. 2+ pedal pulses. No carotid bruits.  Abdomen: Soft, no tenderness, no masses palpated. No hepatosplenomegaly. Bowel sounds positive.  Musculoskeletal: no clubbing / cyanosis. Good ROM, no contractures. Normal muscle tone.  Skin: no significant rashes, lesions, ulcers on limited dermatological examination. Neurologic: CN 2-12 grossly intact. Sensation intact, DTR normal. Strength 5/5 in all 4.  Psychiatric: Normal judgment and insight. Alert and oriented x 3. Normal mood.   Labs on Admission: I have personally reviewed following labs and imaging studies  CBC: Recent Labs  Lab 06/16/18 1330  WBC 7.5  NEUTROABS 5.1  HGB 13.8  HCT 42.7  MCV 92.0  PLT 400   Basic Metabolic Panel: Recent Labs  Lab 06/16/18 1330  NA 141  K 4.1  CL 107  CO2 23  GLUCOSE 193*  BUN 18  CREATININE 0.68  CALCIUM 9.4   GFR: Estimated Creatinine Clearance: 69.6 mL/min (by C-G formula based on SCr of 0.68 mg/dL). Liver Function Tests: No results for input(s): AST, ALT, ALKPHOS, BILITOT, PROT, ALBUMIN in the last 168 hours. No results for input(s): LIPASE, AMYLASE in the last 168 hours. No results for input(s): AMMONIA in the last 168 hours. Coagulation Profile: No results for input(s): INR, PROTIME in the last 168 hours. Cardiac Enzymes: No results for input(s):  CKTOTAL, CKMB, CKMBINDEX, TROPONINI in the last 168 hours. BNP (last 3 results) No results for input(s): PROBNP in the last 8760 hours. HbA1C: No results for input(s): HGBA1C in the last 72 hours. CBG: No results for input(s): GLUCAP in the last 168 hours. Lipid Profile: No results for input(s): CHOL, HDL, LDLCALC, TRIG, CHOLHDL, LDLDIRECT in the last 72 hours. Thyroid Function Tests: No results for input(s): TSH, T4TOTAL, FREET4, T3FREE, THYROIDAB in the last 72 hours. Anemia Panel: No results for input(s): VITAMINB12, FOLATE, FERRITIN, TIBC, IRON, RETICCTPCT in the last 72 hours. Urine analysis:    Component Value Date/Time   BILIRUBINUR negative 07/03/2017 0829   BILIRUBINUR neg 07/27/2012 1346   KETONESUR negative 07/03/2017 0829   PROTEINUR negative 07/03/2017 0829   PROTEINUR neg 07/27/2012 1346   UROBILINOGEN 0.2 07/03/2017 0829   NITRITE Negative 07/03/2017 0829   NITRITE neg 07/27/2012 1346   LEUKOCYTESUR Trace (A) 07/03/2017 0829    Radiological Exams on Admission: Dg Chest 2 View  Result Date: 06/16/2018 CLINICAL DATA:  Pt reports being sent here from UC with fluid on her lungs. Pt reports she has been SHOB, fatigue, and cough for the last 3 weeks. Pt has hx of CHF, Type 2 Diabetes. Nonsmoker. EXAM: CHEST - 2 VIEW COMPARISON:  03/23/2015 FINDINGS: Increase in central pulmonary vascular congestion. Worsening perihilar and bibasilar interstitial edema or infiltrates with probable septal lines peripherally. New small bilateral pleural effusions. Heart size upper limits normal. No pneumothorax. Visualized bones unremarkable. IMPRESSION: Probable mild pulmonary interstitial edema and small effusions, new since previous Electronically Signed   By: Lucrezia Europe M.D.   On: 06/16/2018 14:36   05/27/2015 echocardiogram complete ------------------------------------------------------------------- LV EF: 55% -    60%  ------------------------------------------------------------------- Indications:      Abnormal EKG (R94.31).  ------------------------------------------------------------------- History:   Risk factors:  Family history of coronary artery disease.  ------------------------------------------------------------------- Study Conclusions  - Left ventricle: The cavity size was mildly dilated. Systolic   function was normal. The estimated ejection fraction was in the   range of 55% to 60%. Probable hypokinesis of the   basal-midinferolateral and inferior myocardium. There was an  increased relative contribution of atrial contraction to   ventricular filling. Doppler parameters are consistent with   abnormal left ventricular relaxation (grade 1 diastolic   dysfunction). - Aortic valve: Trileaflet; normal thickness, mildly calcified   leaflets. - Mitral valve: There was mild regurgitation  06/19/2015 left heart cath and coronary angiography  Left Heart Cath and Coronary Angiography  Conclusion   1. Angiographically normal coronary arteries 2. There is possible mild left ventricular systolic dysfunction - difficult to assess due to ectopy    Relatively normal angiography with difficult to interpret LV gram.  The patient did have frequent ectopy which could be a source of her symptoms. Would evaluate for nonischemic etiology for NSVT and chest pain.   Plan:  Standard post radial cath care with TR-removal in short stay.   Discharge this afternoon.  Follow-up with Dr. Oval Linsey   EKG: Independently reviewed.  Vent. rate 111 BPM PR interval * ms QRS duration 81 ms QT/QTc 351/477 ms P-R-T axes 63 65 49 Sinus tachycardia Ventricular premature complex Probable left atrial enlargement Low voltage, precordial leads Probable anteroseptal infarct, old  Assessment/Plan Principal Problem:   Acute on chronic diastolic congestive heart failure  (HCC) Observation/telemetry. Continue supplemental oxygen. Furosemide 40 mg IVP twice a day. Supplement potassium. Monitor daily weights, intake and output. Check echocardiogram.  Active Problems:   Elevated LDL cholesterol level Should discuss with her PCP for long-term treatment, given her history of diabetes and family history of CAD.    Type 2 diabetes mellitus (HCC) Last hemoglobin A1c was 6.5% in September this year. Carbohydrate modified diet. Continue metformin 850 mg p.o. twice daily. CBG monitoring regular insulin sliding scale while in the hospital.    NSVT (nonsustained ventricular tachycardia) (HCC) Continue Rythmol 150 mg p.o. every 8 hours. Optimize and supplemental electrolytes as needed.    GERD (gastroesophageal reflux disease) Famotidine 20 mg p.o. twice daily.   DVT prophylaxis: Heparin SQ. Code Status: Full code. Family Communication: Disposition Plan: Observation for 69-50 hrs for diastolic CHF exacerbation. Consults called: Admission status: Inpatient/stepdown.   Reubin Milan MD [facility] Pager [phone].  If 7PM-7AM, please contact night-coverage [email or web address] Password [email or web address]  06/16/2018, 5:11 PM

## 2018-06-16 NOTE — ED Notes (Signed)
ED TO INPATIENT HANDOFF REPORT  Name/Age/Gender Emily [name] 57 y.o. female  Code Status    Code Status Orders  (From admission, onward)         Start     Ordered   06/16/18 1620  Full code  Continuous     06/16/18 1620        Code Status History    Date Active Date Inactive Code Status Order ID Comments User Context   06/19/2015 0829 06/19/2015 1527 Full Code [id number]  Leonie Man, MD Inpatient      Home/SNF/Other Home  Chief Complaint fluid on lungs  Level of Care/Admitting Diagnosis ED Disposition    ED Disposition Condition [place]: [facility] [[id number]]  Level of Care: Telemetry [5]  Admit to tele based on following criteria: Monitor for Ischemic changes  Diagnosis: Acute on chronic diastolic congestive heart failure [facility]) [881103]  Admitting Physician: Reubin Milan [[id number]]  Attending Physician: Reubin Milan [[id number]]  PT Class (Do Not Modify): Observation [104]  PT Acc Code (Do Not Modify): Observation [10022]       Medical History Past Medical History:  Diagnosis Date  . Allergy   . CHF (congestive heart failure) ([place])   . Chronic diastolic heart failure ([place]) 06/11/2015  . GERD (gastroesophageal reflux disease)   . NSVT (nonsustained ventricular tachycardia) ([place]) 06/13/2015  . PVC's (premature ventricular contractions) 06/11/2015  . Type 2 diabetes mellitus (HCC)     Allergies Allergies  Allergen Reactions  . Hornet Venom Swelling  . Tetanus Toxoids Other (See Comments)    Big red rash and swelling in the muscle    IV Location/Drains/Wounds Patient Lines/Drains/Airways Status   Active Line/Drains/Airways    Name:   Placement date:   Placement time:   Site:   Days:   Peripheral IV 06/16/18 Left Antecubital   06/16/18    -    Antecubital   less than 1          Labs/Imaging Results for orders placed or performed during the hospital encounter of 06/16/18 (from the past  48 hour(s))  Basic metabolic panel     Status: Abnormal   Collection Time: 06/16/18  1:30 PM  Result Value Ref Range   Sodium 141 135 - 145 mmol/L   Potassium 4.1 3.5 - 5.1 mmol/L   Chloride 107 98 - 111 mmol/L   CO2 23 22 - 32 mmol/L   Glucose, Bld 193 (H) 70 - 99 mg/dL   BUN 18 6 - 20 mg/dL   Creatinine, Ser 0.68 0.44 - 1.00 mg/dL   Calcium 9.4 8.9 - 10.3 mg/dL   GFR calc non Af Amer >60 >60 mL/min   GFR calc Af Amer >60 >60 mL/min    Comment: (NOTE) The eGFR has been calculated using the CKD EPI equation. This calculation has not been validated in all clinical situations. eGFR's persistently <60 mL/min signify possible Chronic Kidney Disease.    Anion gap 11 5 - 15    Comment: Performed at [facility], [place] [address], [place], [place] [address]  Brain natriuretic peptide     Status: Abnormal   Collection Time: 06/16/18  1:30 PM  Result Value Ref Range   B Natriuretic Peptide 417.2 (H) 0.0 - 100.0 pg/mL    Comment: Performed at [facility], [place] [address] [place], [place] [address]  CBC with Differential     Status: None  Collection Time: 06/16/18  1:30 PM  Result Value Ref Range   WBC 7.5 4.0 - 10.5 K/uL   RBC 4.64 3.87 - 5.11 MIL/uL   Hemoglobin 13.8 12.0 - 15.0 g/dL   HCT 42.7 36.0 - 46.0 %   MCV 92.0 80.0 - 100.0 fL   MCH 29.7 26.0 - 34.0 pg   MCHC 32.3 30.0 - 36.0 g/dL   RDW 14.1 11.5 - 15.5 %   Platelets 278 150 - 400 K/uL   nRBC 0.0 0.0 - 0.2 %   Neutrophils Relative % 69 %   Neutro Abs 5.1 1.7 - 7.7 K/uL   Lymphocytes Relative 25 %   Lymphs Abs 1.9 0.7 - 4.0 K/uL   Monocytes Relative 6 %   Monocytes Absolute 0.5 0.1 - 1.0 K/uL   Eosinophils Relative 0 %   Eosinophils Absolute 0.0 0.0 - 0.5 K/uL   Basophils Relative 0 %   Basophils Absolute 0.0 0.0 - 0.1 K/uL   Immature Granulocytes 0 %   Abs Immature Granulocytes 0.02 0.00 - 0.07 K/uL    Comment: Performed at [facility], [place] [address]  [place], [place] [address]  I-stat troponin, ED     Status: None   Collection Time: 06/16/18  1:51 PM  Result Value Ref Range   Troponin i, poc 0.04 0.00 - 0.08 ng/mL   Comment 3            Comment: Due to the release kinetics of cTnI, a negative result within the first hours of the onset of symptoms does not rule out myocardial infarction with certainty. If myocardial infarction is still suspected, repeat the test at appropriate intervals.    Dg Chest 2 View  Result Date: 06/16/2018 CLINICAL DATA:  Pt reports being sent here from UC with fluid on her lungs. Pt reports she has been SHOB, fatigue, and cough for the last 3 weeks. Pt has hx of CHF, Type 2 Diabetes. Nonsmoker. EXAM: CHEST - 2 VIEW COMPARISON:  03/23/2015 FINDINGS: Increase in central pulmonary vascular congestion. Worsening perihilar and bibasilar interstitial edema or infiltrates with probable septal lines peripherally. New small bilateral pleural effusions. Heart size upper limits normal. No pneumothorax. Visualized bones unremarkable. IMPRESSION: Probable mild pulmonary interstitial edema and small effusions, new since previous Electronically Signed   By: Lucrezia Europe M.D.   On: 06/16/2018 14:36    Pending Labs Unresulted Labs (From admission, onward)    Start     Ordered   06/17/18 0500  HIV antibody (Routine Testing)  Tomorrow morning,   R     06/16/18 1620   06/16/18 1617  Magnesium  Add-on,   R     06/16/18 1617   06/16/18 1617  Phosphorus  Add-on,   R     06/16/18 1617   Signed and Held  Basic metabolic panel  Daily,   R     Signed and Held          Vitals/Pain Today's Vitals   06/16/18 1258 06/16/18 1318 06/16/18 1330 06/16/18 1419  BP: 130/90  126/82 111/84  Pulse: (!) 115   (!) 110  Resp: _0 Temp: 98 F (36.7 C)     TempSrc: Oral     SpO2: 98%   95%  Weight: 77.1 kg     Height: 4' 11.5" (1.511 m)     PainSc:  3       Isolation Precautions No active isolations  Medications Medications  potassium chloride SA (K-DUR,KLOR-CON) CR tablet 40 mEq (has no administration in time range)  magnesium sulfate IVPB 2 g 50 mL (has no administration in time range)  enoxaparin (LOVENOX) injection 40 mg (has no administration in time range)  ondansetron (ZOFRAN) tablet 4 mg (has no administration in time range)    Or  ondansetron (ZOFRAN) injection 4 mg (has no administration in time range)  acetaminophen (TYLENOL) tablet 650 mg (has no administration in time range)    Or  acetaminophen (TYLENOL) suppository 650 mg (has no administration in time range)  insulin aspart (novoLOG) injection 0-15 Units (has no administration in time range)  furosemide (LASIX) injection 40 mg (40 mg Intravenous Given 06/16/18 1421)    Mobility walks

## 2018-06-16 NOTE — ED Triage Notes (Signed)
Pt reports being sent here from UC with fluid on her lungs. Pt reports she has been SHOB, fatigue, and cough for the last 3 weeks. Pt has hx of CHF.

## 2018-06-16 NOTE — ED Provider Notes (Signed)
[facility] DEPT Provider Note   CSN: [id number] Arrival date & time: 06/16/18  1245     History   Chief Complaint Chief Complaint  Patient presents with  . Shortness of Breath    HPI Emily [name] is a 57 y.o. female who presents with SOB. PMH significant for diastolic CHF, GERD, hx of SVT, type 2 DM. She states that over the past 2-3 weeks she has been getting progressively worsening short of breath with exertion. She also has profound fatigue and can't do normal activities. She has orthopnea, PND, and some abdominal tightness and hand swelling. She also has a cough which is sometimes dry and sometimes productive. It's worse at night. She does not smoke or have underlying lung disease. She denies fevers. She reports some chest pressure. No leg swelling but has some mild foot edema. She went to UC and was told to come to the ED because of bilateral pleural effusions and right lowe lobe infiltrate. Echo done in 2016 showed EF 55-60%. Cath in 2016 was clean.  Cardiologist is [place].  HPI  Past Medical History:  Diagnosis Date  . Allergy   . CHF (congestive heart failure) ([place])   . Chronic diastolic heart failure ([place]) 06/11/2015  . GERD (gastroesophageal reflux disease)   . NSVT (nonsustained ventricular tachycardia) ([place]) 06/13/2015  . PVC's (premature ventricular contractions) 06/11/2015  . Type 2 diabetes mellitus [facility])     Patient Active Problem List   Diagnosis Date Noted  . Pap smear abnormality of cervix with ASCUS favoring benign 06/16/2016  . Chest pain with high risk for cardiac etiology 06/19/2015  . NSVT (nonsustained ventricular tachycardia) ([place]) 06/13/2015  . Abnormal echocardiogram - regional wall motion abnormality 06/13/2015  . Angina, class II ([place]) 06/13/2015  . Chronic diastolic heart failure ([place]) 06/11/2015  . PVC's (premature ventricular contractions) 06/11/2015  . Family history of early CAD 08/01/2012  .  Elevated LDL cholesterol level 08/01/2012  . Family history of thyroid disorder 08/01/2012  . Type 2 diabetes mellitus ([place]) 08/01/2012    Past Surgical History:  Procedure Laterality Date  . CARDIAC CATHETERIZATION N/A 06/19/2015   Procedure: Left Heart Cath and Coronary Angiography;  Surgeon: Leonie Man, MD;  Location: [facility];  Service: Cardiovascular;  Laterality: N/A;  . TONSILLECTOMY     when she was 3     OB History   None      Home Medications    Prior to Admission medications   Medication Sig Start Date End Date Taking? Authorizing Provider  acetaminophen (TYLENOL) 500 MG tablet Take 500 mg by mouth every 6 (six) hours as needed for moderate pain.   Yes [provider]  aspirin 81 MG tablet Take 81 mg by mouth daily.   Yes [provider]  cholecalciferol (VITAMIN D) 1000 UNITS tablet Take 1,000 Units by mouth daily.   Yes [provider]  ibuprofen (ADVIL,MOTRIN) 200 MG tablet Take 200 mg by mouth every 6 (six) hours as needed for mild pain. Reported on 10/26/2015   Yes [provider]  metFORMIN (GLUCOPHAGE) 850 MG tablet TAKE 1 TABLET BY MOUTH TWICE A DAY WITH A MEAL 04/25/18  Yes Posey Boyer, MD  Multiple Vitamins-Minerals (MULTIVITAMIN WITH MINERALS) tablet Take 1 tablet by mouth daily.   Yes [provider]  nitroGLYCERIN (NITROSTAT) 0.4 MG SL tablet Place 1 tablet (0.4 mg total) under the tongue every 5 (five) minutes as needed for chest pain.  11/08/16  Yes Skeet Latch, MD  propafenone (RYTHMOL) 150 MG tablet TAKE 1 TABLET BY MOUTH EVERY 8 HOURS 05/21/18  Yes Skeet Latch, MD  thiamine (VITAMIN B-1) 100 MG tablet Take 100 mg by mouth daily.   Yes [provider]  blood glucose meter kit and supplies KIT Dispense based on patient and insurance preference. Use up to four times daily as directed. (FOR ICD-9 250.00, 250.01). 06/20/16   [name], MD  Blood Glucose Monitoring Suppl (ONE  TOUCH ULTRA 2) w/Device KIT USE TO TEST UP TO 4 TIMES PER DAY. 08/04/16   [name], MD  glucose blood (ONE TOUCH ULTRA TEST) test strip USE TO TEST UP TO 4 TIMES A DAY. 07/13/17   [name], MD    Family History Family History  Problem Relation Age of Onset  . Heart disease Mother   . COPD Mother   . Emphysema Mother   . Cancer Mother        oral  . Heart disease Father   . Diabetes Father   . COPD Father   . Vision loss Father   . Skin cancer Father        skin  . Hyperlipidemia Father   . Hypertension Father   . Heart disease Sister   . Graves' disease Sister   . Diabetes Sister   . Hyperlipidemia Sister   . Heart failure Sister 47  . Heart disease Brother   . Hyperlipidemia Brother   . Cancer Paternal Grandmother        unknown  . Prostate cancer Paternal Grandfather        Prostate cancer    Social History Social History   Tobacco Use  . Smoking status: Never Smoker  . Smokeless tobacco: Never Used  Substance Use Topics  . Alcohol use: No  . Drug use: No     Allergies   Hornet venom and Tetanus toxoids   Review of Systems Review of Systems  Constitutional: Positive for fatigue. Negative for fever.  Respiratory: Positive for cough and shortness of breath.   Cardiovascular: Positive for chest pain. Negative for palpitations and leg swelling.  Gastrointestinal: Negative for abdominal pain.  Neurological: Negative for syncope.  All other systems reviewed and are negative.    Physical Exam Updated Vital Signs BP 126/82   Pulse (!) 115   Temp 98 F (36.7 C) (Oral)   Resp 16   Ht 4' 11.5" (1.511 m)   Wt 77.1 kg   LMP 10/14/2014   SpO2 98%   BMI 33.76 kg/m   Physical Exam  Constitutional: She is oriented to person, place, and time. She appears well-developed and well-nourished. No distress.  HENT:  Head: Normocephalic and atraumatic.  Eyes: Pupils are equal, round, and reactive to light. Conjunctivae are normal. Right eye exhibits no  discharge. Left eye exhibits no discharge. No scleral icterus.  Neck: Normal range of motion.  Cardiovascular: Tachycardia present. Exam reveals no gallop and no friction rub.  No murmur heard. HR goes up to 130s when walking and stays around 110-120 at rest  Pulmonary/Chest: Effort normal. Tachypnea noted. No respiratory distress. She has rales.  Bibasilar crackles  Abdominal: Soft. Bowel sounds are normal. She exhibits no distension. There is no tenderness.  Musculoskeletal:  No significant peripheral edema  Neurological: She is alert and oriented to person, place, and time.  Skin: Skin is warm and dry.  Psychiatric: She has a normal mood and affect. Her behavior  is normal.  Nursing note and vitals reviewed.    ED Treatments / Results  Labs (all labs ordered are listed, but only abnormal results are displayed) Labs Reviewed  BASIC METABOLIC PANEL - Abnormal; Notable for the following components:      Result Value   Glucose, Bld 193 (*)    All other components within normal limits  BRAIN NATRIURETIC PEPTIDE - Abnormal; Notable for the following components:   B Natriuretic Peptide 417.2 (*)    All other components within normal limits  CBC WITH DIFFERENTIAL/PLATELET  MAGNESIUM  PHOSPHORUS  I-STAT TROPONIN, ED    EKG EKG Interpretation  Date/Time:  Saturday June 16 2018 13:32:08 EST Ventricular Rate:  111 PR Interval:    QRS Duration: 81 QT Interval:  351 QTC Calculation: 477 R Axis:   65 Text Interpretation:  Sinus tachycardia Probable left atrial enlargement Low voltage, precordial leads Probable anteroseptal infarct, old Baseline wander in lead(s) II III aVF Confirmed by Virgel Manifold [phone]) on 06/16/2018 3:40:38 PM   Radiology Dg Chest 2 View  Result Date: 06/16/2018 CLINICAL DATA:  Pt reports being sent here from UC with fluid on her lungs. Pt reports she has been SHOB, fatigue, and cough for the last 3 weeks. Pt has hx of CHF, Type 2 Diabetes. Nonsmoker.  EXAM: CHEST - 2 VIEW COMPARISON:  03/23/2015 FINDINGS: Increase in central pulmonary vascular congestion. Worsening perihilar and bibasilar interstitial edema or infiltrates with probable septal lines peripherally. New small bilateral pleural effusions. Heart size upper limits normal. No pneumothorax. Visualized bones unremarkable. IMPRESSION: Probable mild pulmonary interstitial edema and small effusions, new since previous Electronically Signed   By: Lucrezia Europe M.D.   On: 06/16/2018 14:36    Procedures Procedures (including critical care time)  Medications Ordered in ED Medications  furosemide (LASIX) injection 40 mg (40 mg Intravenous Given 06/16/18 1421)     Initial Impression / Assessment and Plan / ED Course  I have reviewed the triage vital signs and the nursing notes.  Pertinent labs & imaging results that were available during my care of the patient were reviewed by me and considered in my medical decision making (see chart for details).  57 year old female with gradually worsening SOB, orthopnea, PND, fatigue. She is persistently tachycardic but otherwise vitals are normal. CXR showed interstitial edema and bilateral small pleural effusions which is new. EKG is sinus tachycardia with PVCs. CBC is normal. BMP shows hyperglycemia (193). I-stat trop is 0.04. BNP is 417. She was given IV Lasix and feels a little better. Due to her being so symptomatic, will admit for further management. Spoke with Dr. Olevia Bowens who will admit.  Final Clinical Impressions(s) / ED Diagnoses   Final diagnoses:  Acute on chronic congestive heart failure, unspecified heart failure type [facility])  Hyperglycemia    ED Discharge Orders    None       Recardo Evangelist, PA-C 06/16/18 1629    Virgel Manifold, MD 06/17/18 [phone]

## 2018-06-17 ENCOUNTER — Observation Stay (HOSPITAL_BASED_OUTPATIENT_CLINIC_OR_DEPARTMENT_OTHER): Payer: 59

## 2018-06-17 ENCOUNTER — Other Ambulatory Visit: Payer: Self-pay | Admitting: Cardiovascular Disease

## 2018-06-17 DIAGNOSIS — I340 Nonrheumatic mitral (valve) insufficiency: Secondary | ICD-10-CM

## 2018-06-17 DIAGNOSIS — I110 Hypertensive heart disease with heart failure: Secondary | ICD-10-CM | POA: Diagnosis not present

## 2018-06-17 DIAGNOSIS — I493 Ventricular premature depolarization: Secondary | ICD-10-CM

## 2018-06-17 DIAGNOSIS — I5033 Acute on chronic diastolic (congestive) heart failure: Secondary | ICD-10-CM | POA: Diagnosis not present

## 2018-06-17 DIAGNOSIS — K219 Gastro-esophageal reflux disease without esophagitis: Secondary | ICD-10-CM | POA: Diagnosis not present

## 2018-06-17 DIAGNOSIS — E669 Obesity, unspecified: Secondary | ICD-10-CM | POA: Diagnosis present

## 2018-06-17 DIAGNOSIS — I429 Cardiomyopathy, unspecified: Secondary | ICD-10-CM | POA: Diagnosis not present

## 2018-06-17 DIAGNOSIS — E1169 Type 2 diabetes mellitus with other specified complication: Secondary | ICD-10-CM

## 2018-06-17 DIAGNOSIS — E7800 Pure hypercholesterolemia, unspecified: Secondary | ICD-10-CM | POA: Diagnosis not present

## 2018-06-17 DIAGNOSIS — R0602 Shortness of breath: Secondary | ICD-10-CM | POA: Diagnosis not present

## 2018-06-17 DIAGNOSIS — I5043 Acute on chronic combined systolic (congestive) and diastolic (congestive) heart failure: Secondary | ICD-10-CM | POA: Diagnosis not present

## 2018-06-17 DIAGNOSIS — R739 Hyperglycemia, unspecified: Secondary | ICD-10-CM

## 2018-06-17 DIAGNOSIS — I509 Heart failure, unspecified: Secondary | ICD-10-CM | POA: Diagnosis not present

## 2018-06-17 LAB — BASIC METABOLIC PANEL
Anion gap: 9 (ref 5–15)
BUN: 24 mg/dL — AB (ref 6–20)
CALCIUM: 9.3 mg/dL (ref 8.9–10.3)
CHLORIDE: 104 mmol/L (ref 98–111)
CO2: 27 mmol/L (ref 22–32)
CREATININE: 0.91 mg/dL (ref 0.44–1.00)
GFR calc non Af Amer: >60 mL/min (ref >60)
Glucose, Bld: 139 mg/dL — ABNORMAL HIGH (ref 70–99)
Potassium: 4.7 mmol/L (ref 3.5–5.1)
Sodium: 140 mmol/L (ref 135–145)

## 2018-06-17 LAB — HIV ANTIBODY (ROUTINE TESTING W REFLEX): HIV Screen 4th Generation wRfx: NONREACTIVE

## 2018-06-17 LAB — GLUCOSE, CAPILLARY
Glucose-Capillary: 139 mg/dL — ABNORMAL HIGH (ref 70–99)
Glucose-Capillary: 116 mg/dL — ABNORMAL HIGH (ref 70–99)
Glucose-Capillary: 118 mg/dL — ABNORMAL HIGH (ref 70–99)

## 2018-06-17 LAB — ECHOCARDIOGRAM COMPLETE
Height: 59.5 in
WEIGHTICAEL: 2664 [oz_av]

## 2018-06-17 MED ORDER — ATORVASTATIN CALCIUM 20 MG PO TABS
20.0000 mg | ORAL_TABLET | Freq: Every day | ORAL | Status: DC
  Administered 2018-06-17 – 2018-06-20 (×3): 20 mg via ORAL
  Filled 2018-06-17 (×5): qty 1

## 2018-06-17 MED ORDER — FUROSEMIDE 40 MG PO TABS
40.0000 mg | ORAL_TABLET | Freq: Every day | ORAL | Status: DC
  Administered 2018-06-18 – 2018-06-21 (×3): 40 mg via ORAL
  Filled 2018-06-17 (×6): qty 1

## 2018-06-17 NOTE — Progress Notes (Signed)
  Echocardiogram 2D Echocardiogram has been performed.  Emily [name] [name] 06/17/2018, 10:08 AM

## 2018-06-17 NOTE — Progress Notes (Addendum)
Progress Note    Emily [name]  CBJ:[id number] DOB: 08-16-1960  DOA: 06/16/2018 PCP: [name], MD    Brief Narrative:   Chief complaint: F/U dyspnea/cough  Medical records reviewed and are as summarized below:  Emily [name] is an 57 y.o. female with medical history significant of seasonal allergies, chronic diastolic heart failure, GERD, and SVT, history of PVCs, type 2 diabetes mellitus who was admitted 06/16/2018 with progressive shortness of breath associated with severe paroxysmal cough and chest pressure.  In the ED, she was tachycardic with a heart rate of 115 but oxygen saturation okay at 98%.  Was given supplemental oxygen and 40 mg of IV Lasix with some improvement noted.  Troponin negative.  BNP 417.2.  EKG showed tachycardia and an old anteroseptal infarct.  Echo done 3 years ago with EF 55% and grade 1 diastolic dysfunction.  Chest x-ray showed cardiomegaly and mild pulmonary interstitial edema with small pleural effusions.  Assessment/Plan:   Principal Problem:   Acute on chronic diastolic and newly diagnosed acute systolic congestive heart failure ([place]) Chest x-ray personally reviewed.  Low lung volumes but no frank pulmonary edema appreciated.  Status post Lasix in the ED followed by 40 mg twice daily.  Can likely transition to oral Lasix 06/18/2018 (order written).  Mildly hypotensive.  Repeat 2D echo showed a significant decline in her EF to 25-30%.  Weight down 2.4 kg.  Dyspnea improved.  Given the significant decline in her EF, will need cardiology evaluation for consideration of catheterization, and close outpatient cardiology follow-up. Spoke with Dr. Meda Coffee who will see her later today or tomorrow.  Active Problems:   Elevated LDL cholesterol level LDL 153. We will start statin.    Type 2 diabetes mellitus ([place]), with complications, without long-term insulin use Continue metformin.  CBGs 121-139.    NSVT (nonsustained ventricular tachycardia) (HCC) Continue  Rythmol.  Monitor and replace electrolytes as needed.  Continue to monitor on telemetry.  May need to be considered for an ICD.    GERD (gastroesophageal reflux disease) Continue famotidine.    Obesity (BMI 30.0-34.9) Body mass index is 33.07 kg/m.   Family Communication/Anticipated D/C date and plan/Code Status   DVT prophylaxis: Lovenox ordered. Code Status: Full Code.  Family Communication: Multiple family updated at bedside. Disposition Plan: Likely home 06/18/2018 if no further cardiac work-up planned.   Medical Consultants:    Cardiology   Anti-Infectives:    None  Subjective:   Patient reports improvement in dyspnea on Lasix 40 mg IV twice daily with reduced abdominal swelling.  No chest pain.  Objective:    Vitals:   06/16/18 1727 06/16/18 2223 06/16/18 2253 06/17/18 0504  BP:  96/63 114/60 110/75  Pulse:  98 90 99  Resp:  16 16 16   Temp:  97.8 F (36.6 C)  97.7 F (36.5 C)  TempSrc:  Oral  Oral  SpO2:  98% 98% 99%  Weight: 76.6 kg   75.5 kg  Height: 4' 11.5" (1.511 m)       Intake/Output Summary (Last 24 hours) at 06/17/2018 1213 Last data filed at 06/17/2018 1040 Gross per 24 hour  Intake 851.3 ml  Output 1150 ml  Net -298.7 ml   Filed Weights   06/16/18 1258 06/16/18 1727 06/17/18 0504  Weight: 77.1 kg 76.6 kg 75.5 kg    Exam: General: No acute distress. Cardiovascular: Heart sounds show a regular rate, and rhythm. No gallops or rubs. No murmurs. No JVD.  Lungs: A few bibasilar crackles, clear in the upper airways, no wheezes. Abdomen: Soft, nontender, nondistended with normal active bowel sounds. No masses. No hepatosplenomegaly. Neurological: Alert and oriented 3. Moves all extremities 4 with equal strength. Cranial nerves II through XII grossly intact. Skin: Warm and dry. No rashes or lesions. Extremities: No clubbing or cyanosis.  Trace edema. Pedal pulses 2+. Psychiatric: Mood and affect are normal. Insight and judgment are  normal.   Data Reviewed:   I have personally reviewed following labs and imaging studies:  Labs: Labs show the following:   Basic Metabolic Panel: Recent Labs  Lab 06/16/18 1330 06/17/18 0455  NA 141 140  K 4.1 4.7  CL 107 104  CO2 23 27  GLUCOSE 193* 139*  BUN 18 24*  CREATININE 0.68 0.91  CALCIUM 9.4 9.3  MG 1.9  --   PHOS 3.2  --    GFR Estimated Creatinine Clearance: 60.4 mL/min (by C-G formula based on SCr of 0.91 mg/dL).  CBC: Recent Labs  Lab 06/16/18 1330  WBC 7.5  NEUTROABS 5.1  HGB 13.8  HCT 42.7  MCV 92.0  PLT 278   Cardiac Enzymes: Recent Labs  Lab 06/16/18 1953  TROPONINI <0.03   CBG: Recent Labs  Lab 06/16/18 1735 06/16/18 2221 06/17/18 0743  GLUCAP 134* 121* 139*    Microbiology No results found for this or any previous visit (from the past 240 hour(s)).  Procedures and diagnostic studies:  Dg Chest 2 View  Result Date: 06/16/2018 CLINICAL DATA:  Pt reports being sent here from UC with fluid on her lungs. Pt reports she has been SHOB, fatigue, and cough for the last 3 weeks. Pt has hx of CHF, Type 2 Diabetes. Nonsmoker. EXAM: CHEST - 2 VIEW COMPARISON:  03/23/2015 FINDINGS: Increase in central pulmonary vascular congestion. Worsening perihilar and bibasilar interstitial edema or infiltrates with probable septal lines peripherally. New small bilateral pleural effusions. Heart size upper limits normal. No pneumothorax. Visualized bones unremarkable. IMPRESSION: Probable mild pulmonary interstitial edema and small effusions, new since previous Electronically Signed   By: Lucrezia Europe M.D.   On: 06/16/2018 14:36    Medications:   . aspirin EC  81 mg Oral Daily  . atorvastatin  20 mg Oral q1800  . cholecalciferol  1,000 Units Oral Daily  . enoxaparin (LOVENOX) injection  40 mg Subcutaneous Q24H  . famotidine  20 mg Oral BID  . furosemide  40 mg Intravenous BID  . insulin aspart  0-15 Units Subcutaneous TID WC  . lisinopril  2.5 mg  Oral Daily  . metFORMIN  850 mg Oral BID WC  . multivitamin with minerals  1 tablet Oral Daily  . potassium chloride  20 mEq Oral BID  . propafenone  150 mg Oral Q8H  . thiamine  100 mg Oral Daily   Continuous Infusions:   LOS: 0 days   Jacquelynn Cree  [facility] Pager [phone]. If unable to reach me by pager, please call my cell phone at [phone].  *Please refer to [email or web address], password [email or web address] to get updated schedule on who will round on this patient, as hospitalists switch teams weekly. If 7PM-7AM, please contact night-coverage at [email or web address], password [email or web address] for any overnight needs.  06/17/2018, 12:13 PM

## 2018-06-18 DIAGNOSIS — I5033 Acute on chronic diastolic (congestive) heart failure: Secondary | ICD-10-CM

## 2018-06-18 DIAGNOSIS — I472 Ventricular tachycardia: Secondary | ICD-10-CM

## 2018-06-18 DIAGNOSIS — E7800 Pure hypercholesterolemia, unspecified: Secondary | ICD-10-CM

## 2018-06-18 DIAGNOSIS — I5041 Acute combined systolic (congestive) and diastolic (congestive) heart failure: Secondary | ICD-10-CM

## 2018-06-18 LAB — GLUCOSE, CAPILLARY
Glucose-Capillary: 101 mg/dL — ABNORMAL HIGH (ref 70–99)
Glucose-Capillary: 146 mg/dL — ABNORMAL HIGH (ref 70–99)
Glucose-Capillary: 109 mg/dL — ABNORMAL HIGH (ref 70–99)
Glucose-Capillary: 155 mg/dL — ABNORMAL HIGH (ref 70–99)
Glucose-Capillary: 124 mg/dL — ABNORMAL HIGH (ref 70–99)

## 2018-06-18 LAB — BASIC METABOLIC PANEL
Anion gap: 11 (ref 5–15)
BUN: 35 mg/dL — AB (ref 6–20)
CHLORIDE: 101 mmol/L (ref 98–111)
CO2: 25 mmol/L (ref 22–32)
Calcium: 9.3 mg/dL (ref 8.9–10.3)
Creatinine, Ser: 0.93 mg/dL (ref 0.44–1.00)
GFR calc Af Amer: >60 mL/min (ref >60)
GFR calc non Af Amer: >60 mL/min (ref >60)
GLUCOSE: 132 mg/dL — AB (ref 70–99)
Potassium: 4.1 mmol/L (ref 3.5–5.1)
SODIUM: 137 mmol/L (ref 135–145)

## 2018-06-18 MED ORDER — ASPIRIN EC 81 MG PO TBEC
81.0000 mg | DELAYED_RELEASE_TABLET | Freq: Every day | ORAL | Status: DC
  Administered 2018-06-20 – 2018-06-21 (×2): 81 mg via ORAL
  Filled 2018-06-18 (×3): qty 1

## 2018-06-18 MED ORDER — SODIUM CHLORIDE 0.9% FLUSH
3.0000 mL | Freq: Two times a day (BID) | INTRAVENOUS | Status: DC
  Administered 2018-06-18: 3 mL via INTRAVENOUS

## 2018-06-18 MED ORDER — ASPIRIN 81 MG PO CHEW
81.0000 mg | CHEWABLE_TABLET | ORAL | Status: AC
  Administered 2018-06-19: 81 mg via ORAL
  Filled 2018-06-18: qty 1

## 2018-06-18 MED ORDER — SODIUM CHLORIDE 0.9 % IV SOLN
INTRAVENOUS | Status: DC
  Administered 2018-06-19 (×2): via INTRAVENOUS

## 2018-06-18 MED ORDER — SODIUM CHLORIDE 0.9% FLUSH: 3.0000 mL | INTRAVENOUS | Status: DC | PRN

## 2018-06-18 MED ORDER — SODIUM CHLORIDE 0.9 % IV SOLN: 250.0000 mL | INTRAVENOUS | Status: DC | PRN

## 2018-06-18 NOTE — H&P (View-Only) (Signed)
Cardiology Consultation:   Patient ID: Emily [name] MRN: [id number]; DOB: [birth year]  Admit date: 06/16/2018 Date of Consult: 06/18/2018  Primary Care Provider: [name], MD Primary Cardiologist: Skeet Latch, MD  Primary Electrophysiologist:  None    Patient Profile:   Emily [name] is a 57 y.o. female with a hx of normal LHC in 6283, chronic diastolic HF, NSVT and frequent PVCs treated with propafenone, mitral regurgitation, type 2 diabetes, HLD and family history of premature CAD who is being seen today for the evaluation of acute CHF and abnormal echocardiogram, showing severely reduced LVEF at 25-30% and moderate to severe MR (ischemic appearing) at the request of Dr. Darrick Meigs, Internal Medicine.  History of Present Illness:   Emily [name] is followed by Dr. Oval Linsey. She was evaluated in 2016 for CP.  She had an ETT on 05/15/15 that was negative for ischemia.  Echo showed LVEF 55-60% with probably hypokinesis of the basal-mid inferolateral and inferior myocardium and grade 1 diastolic dysfunction. She subsequently underwent LHC, which showed no CAD.  Emily [name] also wore a 48 hour Holter that showed frequent PVCs and occasional PACs.  She was started on metoprolol but reported feeling depressed at her follow up appointment.  Metoprolol was discontinued and she was started on propafenone. She was last seen in clinic 07/2017 and was doing well from a symptom standpoint and her PVCs were noted to be much improved w/ propafenone.   She presented [facility] ED on 06[facility]16/18 with CC of progressive SOB associated with severe paroxysmal cough and chest pressure. Symptoms started ~3 weeks ago and progressively worsening. Chest pressure/ tightness, dyspnea with exertion. Decreased exercise tolerance. Would give out and have to stop and rest just walking to her mail box. Also noted orthopnea, PND and increased abdominal girth. No significant LEE.   ED Course: EKG showed sinus tach 111 bpm, PVCs,  QT/QTc  351/477 ms. BP 130/90. O2 sats 98% on RA. POC troponin negative. BNP elevated at 417.2. CBC and BMP unremarkable. CXR w/ mild pulmonary interstitial edema and small effusions. IV Lasix given with improvement of symptoms. Pt admitted by IM for acute CHF.  IV Lasix was continued. Good UOP thus far w/ 3.2 L out yesterday. Net I/Os negative 2.3 L since admit. Renal function and electrolytes stable. SCr 0.93 today and K 4.1.  2D echo obtained yesterday and demonstrated severely reduced LV systolic function with EF in the range of 25-30%. Doppler parameters c/w with both elevated ventricular end diastolic filling pressure and elevated left atrial filling pressure. Mitral valve function also appears abnormal w/ significant ischemic appearing MR w/ restricted posterior leaflet motion calcified annulus, moderately thickened moderately calcified leaflets and moderate to severe regurgitation.   She reports she is feeling better. Breathing has improved. Able to sleep almost completely flat last night. Currently CP free.     Past Medical History:  Diagnosis Date  . Allergy   . CHF (congestive heart failure) ([place])   . Chronic diastolic heart failure ([place]) 06/11/2015  . GERD (gastroesophageal reflux disease)   . NSVT (nonsustained ventricular tachycardia) ([place]) 06/13/2015  . PVC's (premature ventricular contractions) 06/11/2015  . Type 2 diabetes mellitus ([place])     Past Surgical History:  Procedure Laterality Date  . CARDIAC CATHETERIZATION N/A 06/19/2015   Procedure: Left Heart Cath and Coronary Angiography;  Surgeon: Leonie Man, MD;  Location: [place] [facility];  Service: Cardiovascular;  Laterality: N/A;  . TONSILLECTOMY     when she  was 3     Home Medications:  Prior to Admission medications   Medication Sig Start Date End Date Taking? Authorizing Provider  acetaminophen (TYLENOL) 500 MG tablet Take 500 mg by mouth every 6 (six) hours as needed for moderate pain.   Yes [provider]  aspirin 81 MG tablet Take 81 mg by mouth daily.   Yes [provider]  cholecalciferol (VITAMIN D) 1000 UNITS tablet Take 1,000 Units by mouth daily.   Yes [provider]  ibuprofen (ADVIL,MOTRIN) 200 MG tablet Take 200 mg by mouth every 6 (six) hours as needed for mild pain. Reported on 10/26/2015   Yes [provider]  metFORMIN (GLUCOPHAGE) 850 MG tablet TAKE 1 TABLET BY MOUTH TWICE A DAY WITH A MEAL 04/25/18  Yes Posey Boyer, MD  Multiple Vitamins-Minerals (MULTIVITAMIN WITH MINERALS) tablet Take 1 tablet by mouth daily.   Yes [provider]  nitroGLYCERIN (NITROSTAT) 0.4 MG SL tablet Place 1 tablet (0.4 mg total) under the tongue every 5 (five) minutes as needed for chest pain. 11/08/16  Yes Skeet Latch, MD  propafenone (RYTHMOL) 150 MG tablet TAKE 1 TABLET BY MOUTH EVERY 8 HOURS 05/21/18  Yes Skeet Latch, MD  thiamine (VITAMIN B-1) 100 MG tablet Take 100 mg by mouth daily.   Yes [provider]  blood glucose meter kit and supplies KIT Dispense based on patient and insurance preference. Use up to four times daily as directed. (FOR ICD-9 250.00, 250.01). 06/20/16   [name], MD  Blood Glucose Monitoring Suppl (ONE TOUCH ULTRA 2) w/Device KIT USE TO TEST UP TO 4 TIMES PER DAY. 08/04/16   [name], MD  glucose blood (ONE TOUCH ULTRA TEST) test strip USE TO TEST UP TO 4 TIMES A DAY. 07/13/17   [name], MD    Inpatient Medications: Scheduled Meds: . aspirin EC  81 mg Oral Daily  . atorvastatin  20 mg Oral q1800  . cholecalciferol  1,000 Units Oral Daily  . enoxaparin (LOVENOX) injection  40 mg Subcutaneous Q24H  . famotidine  20 mg Oral BID  . furosemide  40 mg Oral Daily  . insulin aspart  0-15 Units Subcutaneous TID WC  . lisinopril  2.5 mg Oral Daily  . metFORMIN  850 mg Oral BID WC  . multivitamin with minerals  1 tablet Oral Daily  . potassium chloride  20 mEq Oral BID  . propafenone  150 mg Oral Q8H  . thiamine   100 mg Oral Daily   Continuous Infusions:  PRN Meds: acetaminophen **OR** acetaminophen, ipratropium-albuterol, nitroGLYCERIN, ondansetron **OR** ondansetron (ZOFRAN) IV  Allergies:    Allergies  Allergen Reactions  . Hornet Venom Swelling  . Tetanus Toxoids Other (See Comments)    Big red rash and swelling in the muscle    Social History:   Social History   Socioeconomic History  . Marital status: Married    Spouse name: Not on file  . Number of children: 5  . Years of education: Not on file  . Highest education level: Not on file  Occupational History  . 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 Financial resource strain: Not on file  . Food insecurity:    Worry: Not on file    Inability: Not on file  . Transportation needs:    Medical: Not on file    Non-medical: Not on file  Tobacco Use  . Smoking status: Never Smoker  . Smokeless tobacco: Never  Used  Substance and Sexual Activity  . Alcohol use: No  . Drug use: No  . Sexual activity: Yes  Lifestyle  . Physical activity:    Days per week: Not on file    Minutes per session: Not on file  . Stress: Not on file  Relationships  . Social connections:    Talks on phone: Not on file    Gets together: Not on file    Attends religious service: Not on file    Active member of club or organization: Not on file    Attends meetings of clubs or organizations: Not on file    Relationship status: Not on file  . Intimate partner violence:    Fear of current or ex partner: Not on file    Emotionally abused: Not on file    Physically abused: Not on file    Forced sexual activity: Not on file  Other Topics Concern  . Not on file  Social History Narrative   Married.  Education: [employer].          Family History:    Family History  Problem Relation Age of Onset  . Heart disease Mother   . COPD Mother   . Emphysema Mother   . Cancer Mother        oral  . Heart disease Father   . Diabetes Father   . COPD Father   .  Vision loss Father   . Skin cancer Father        skin  . Hyperlipidemia Father   . Hypertension Father   . Heart disease Sister   . Graves' disease Sister   . Diabetes Sister   . Hyperlipidemia Sister   . Heart failure Sister 39  . Heart disease Brother   . Hyperlipidemia Brother   . Cancer Paternal Grandmother        unknown  . Prostate cancer Paternal Grandfather        Prostate cancer     ROS:  Please see the history of present illness.   All other ROS reviewed and negative.     Physical Exam/Data:   Vitals:   06/17/18 1456 06/17/18 2138 06/18/18 0507 06/18/18 0947  BP: 102/74 90/61 104/70 110/72  Pulse: (!) 104 94 91   Resp: '18 16 14   ' Temp: 98.1 F (36.7 C) 98.5 F (36.9 C) 97.7 F (36.5 C)   TempSrc: Oral Oral Oral   SpO2: 99% 95% 97%   Weight:   74.2 kg   Height:   '4\' 11"'  (1.499 m)     Intake/Output Summary (Last 24 hours) at 06/18/2018 1023 Last data filed at 06/18/2018 0500 Gross per 24 hour  Intake 350 ml  Output 3200 ml  Net -2850 ml   Filed Weights   06/16/18 1727 06/17/18 0504 06/18/18 0507  Weight: 76.6 kg 75.5 kg 74.2 kg   Body mass index is 33.02 kg/m.  General:  Well nourished, well developed, in no acute distress HEENT: normal Lymph: no adenopathy Neck: no JVD Endocrine:  No thryomegaly Vascular: No carotid bruits; FA pulses 2+ bilaterally without bruits  Cardiac:  normal S1, S2; RRR; no murmur  Lungs:  clear to auscultation bilaterally, no wheezing, rhonchi or rales  Abd: soft, nontender, no hepatomegaly  Ext: no edema Musculoskeletal:  No deformities, BUE and BLE strength normal and equal Skin: warm and dry  Neuro:  CNs 2-12 intact, no focal abnormalities noted Psych:  Normal affect   EKG:  The  EKG was personally reviewed and demonstrates:  06/17/18 Sinus rhythm with occasional Premature ventricular complexes, Low voltage QRS Prolonged QT  382/492 ms Telemetry:  Telemetry was personally reviewed and demonstrates:  NSR. Rare  PVCs   Relevant CV Studies: LHC 06/19/15 Procedures   Left Heart Cath and Coronary Angiography  Conclusion   1. Angiographically normal coronary arteries 2. There is possible mild left ventricular systolic dysfunction - difficult to assess due to ectopy    Relatively normal angiography with difficult to interpret LV gram.  The patient did have frequent ectopy which could be a source of her symptoms. Would evaluate for nonischemic etiology for NSVT and chest pain.    2D Echo 05/27/15 Study Conclusions  - Left ventricle: The cavity size was mildly dilated. Systolic   function was normal. The estimated ejection fraction was in the   range of 55% to 60%. Probable hypokinesis of the   basal-midinferolateral and inferior myocardium. There was an   increased relative contribution of atrial contraction to   ventricular filling. Doppler parameters are consistent with   abnormal left ventricular relaxation (grade 1 diastolic   dysfunction). - Aortic valve: Trileaflet; normal thickness, mildly calcified   leaflets. - Mitral valve: There was mild regurgitation.   2D Echo (this admission) 06/17/18 Study Conclusions  - Left ventricle: Septal apical and inferior wall hypokinesis   Systolic function was severely reduced. The estimated ejection   fraction was in the range of 25% to 30%. Doppler parameters are   consistent with both elevated ventricular end-diastolic filling   pressure and elevated left atrial filling pressure. - Mitral valve: Significant ischemic appearing MR with restricted   posterior leafelt motion Calcified annulus. Moderately thickened,   moderately calcified leaflets . There was moderate to severe   regurgitation. - Left atrium: The atrium was mildly dilated. - Atrial septum: No defect or patent foramen ovale was identified.   Laboratory Data:  Chemistry Recent Labs  Lab 06/16/18 1330 06/17/18 0455 06/18/18 0457  NA 141 140 137  K 4.1 4.7 4.1    CL 107 104 101  CO2 '23 27 25  ' GLUCOSE 193* 139* 132*  BUN 18 24* 35*  CREATININE 0.68 0.91 0.93  CALCIUM 9.4 9.3 9.3  GFRNONAA >60 >60 >60  GFRAA >60 >60 >60  ANIONGAP '11 9 11    ' No results for input(s): PROT, ALBUMIN, AST, ALT, ALKPHOS, BILITOT in the last 168 hours. Hematology Recent Labs  Lab 06/16/18 1330  WBC 7.5  RBC 4.64  HGB 13.8  HCT 42.7  MCV 92.0  MCH 29.7  MCHC 32.3  RDW 14.1  PLT 278   Cardiac Enzymes Recent Labs  Lab 06/16/18 1953  TROPONINI <0.03    Recent Labs  Lab 06/16/18 1351  TROPIPOC 0.04    BNP Recent Labs  Lab 06/16/18 1330  BNP 417.2*    DDimer No results for input(s): DDIMER in the last 168 hours.  Radiology/Studies:  Dg Chest 2 View  Result Date: 06/16/2018 CLINICAL DATA:  Pt reports being sent here from UC with fluid on her lungs. Pt reports she has been SHOB, fatigue, and cough for the last 3 weeks. Pt has hx of CHF, Type 2 Diabetes. Nonsmoker. EXAM: CHEST - 2 VIEW COMPARISON:  03/23/2015 FINDINGS: Increase in central pulmonary vascular congestion. Worsening perihilar and bibasilar interstitial edema or infiltrates with probable septal lines peripherally. New small bilateral pleural effusions. Heart size upper limits normal. No pneumothorax. Visualized bones unremarkable. IMPRESSION: Probable mild pulmonary interstitial edema  and small effusions, new since previous Electronically Signed   By: Lucrezia Europe M.D.   On: 06/16/2018 14:36    Assessment and Plan:   Emily [name] is a 57 y.o. female with a hx of normal LHC in 4132, chronic diastolic HF, NSVT and frequent PVCs treated with propafenone, mitral regurgitation, type 2 diabetes, HLD and family history of premature CAD who is being seen today for the evaluation of acute CHF and abnormal echocardiogram, showing severely reduced LVEF at 25-30% and moderate to severe MR (ischemic appearing) at the request of Dr. Darrick Meigs, Internal Medicine.  1. Acute Combined Systolic and Diastolic CHF: in  the setting of systolic and diastolic dysfunction. Admit BNP elevated at 417 and CXR showed mild pulmonary interstitial edema and small effusions. She has had good response to diuretics. Symptoms improved. Able to lay almost completely flat last night. No resting dyspnea.  Net I/Os negative 2L since admit. Renal function and electrolytes have remained stable. BP is well controlled. Appears close to euvolemic on exam. If RHC is persued, can use pressure measurements help guide further diuresis. Continue strict I/Os, daily weights, low sodium diet and close monitoring of renal function, electrolytes and BP while diuresing.   2. Cardiomyopathy: New. EF now 25-30%, previously 55-60% in 2016. She had a LHC in 2016 that showed no CAD. However given new LV dysfunction, chest pain and worsening MR, repeat R/LHC is warranted. Given her prior history of NSVT and frequent PVCs, also ? tachymediated CM. She has previously done well with propaherone, but we may need to consider repeat Holter monitor to assess for recurrence of high burden PVCs. We will also need to treat w/ guidelines directed medical therapy for systolic HF, as BP allows. If BP will tolerate, would recommend low dose Entresto and low dose  blocker (try carvedilol given prior intolerance to metoprolol)  3. Mitral Regurgitation: prior echo in 2016 showed only mild MR. 2D echo yesterday showed significant ischemic appearing MR w/ restricted posterior leaflet motion and moderate to severe MR. She may need TEE to better evaluate and repeat R/LHC given newly reduced LVEF.   4. PVCs/ NSVT: 48 hour Holter in 2016 showed frequent PVCs/ NSVT and occasional PACs in the setting of normal LHC. She did not tolerate metoprolol (fatigue/ depression). Propafenone added and pt noted to have reduced ectopy. Given new reduction of LVF, we will need to monitor for breakthrough recurrence. ? If tachy mediated cardiomyopathy. Monitor on tele. I personally reviewed tele today.  Rare PVCs. No NSVT overnight. If LHC is normal, she may need repeat evaluation with 48 hr Holter post discharge to assess PVC burden.   5. HLD: Recent lipid panel 04/2018 showed elevated LDL at 153 mg/dL. Given her cardiac risk factors including FH of premature CAD, HTN and T2DM, would recommend increasing atorvastatin dose to 40 mg nightly.   6. HTN: controlled on current regimen. SBP however a bit soft in the low 100s . Given her new systolic HF, she will need change in regimen. She is currently on a low dose ACEi (lisinopril) and should be considered for Rush Barer (will need 36 hr lisinopril washout period). Also will need to consider retrial of  blocker. Per office notes, she did not tolerate metoprolol in the past. If BP later allows, may consider low dose carvedilol.   7. Type 2DM: management per primary.   For questions or updates, please contact [place] Please consult [email or web address] for contact info under     Signed, [employer]  Rosita Fire, PA-C  06/18/2018 10:23 AM

## 2018-06-18 NOTE — Progress Notes (Signed)
PROGRESS NOTE  Emily [name] JKD:[id number] DOB: 1961/04/09 DOA: 06/16/2018 PCP: [name], MD  HPI/Recap of past 24 hours:  Late entry note Patient is seen in the morning,  She reports feeling much better, edema has resolved, able to breath better, no cough,  Continue to have chest pressure No fever Husband at bedside  Assessment/Plan: Principal Problem:   Acute on chronic diastolic congestive heart failure (HCC) Active Problems:   Elevated LDL cholesterol level   Type 2 diabetes mellitus (HCC)   NSVT (nonsustained ventricular tachycardia) (HCC)   GERD (gastroesophageal reflux disease)   Obesity (BMI 30.0-34.9)   Acute combined systolic and diastolic heart failure (HCC)    Acute on chronic combined heart failure -h/o normal LHC in 0998, chronic diastolic HF, -echocardiogram this hospitalization showed "new reduced lvef 25-30%,  with moderate to severe mitral regurgitation" -cxr on presentation "Probable mild pulmonary interstitial edema and small effusions, new since previous" -she is diuresed , low extremity edema has resolved, lung exam clear, reports feeling much better -cardiology consulted to have TEE /left and right heart cath , will follow cardiology recommendation  NSVT, h/o frequent pvc's on rhtymol H/o betablocker intolerance due to feeling depressed on it Cardiology consulted   noninsulin dependent DM2, controlled -Recent a1c 6.5 -hold home meds metformin in the hospital, resume at discharge -currently on ssi  Obesity: Body mass index is 33.16 kg/m.     Code Status: full  Family Communication: patient and husband  Disposition Plan: not ready to discharge   Consultants:  cardiology  Procedures:  TEE, right and left heart cath planned  Antibiotics:  none   Objective: BP 114/77 (BP Location: Left Arm)   Pulse (!) 102   Temp 97.9 F (36.6 C) (Oral)   Resp 18   Ht 4\' 11"  (1.499 m)   Wt 74.5 kg   LMP 10/14/2014   SpO2 98%   BMI  33.16 kg/m   Intake/Output Summary (Last 24 hours) at 06/18/2018 1823 Last data filed at 06/18/2018 1206 Gross per 24 hour  Intake 350 ml  Output 2400 ml  Net -2050 ml   Filed Weights   06/17/18 0504 06/18/18 0507 06/18/18 1504  Weight: 75.5 kg 74.2 kg 74.5 kg    Exam: Patient is examined daily including today on 06/18/2018, exams remain the same as of yesterday except that has changed    General:  NAD  Cardiovascular: RRR  Respiratory: CTABL  Abdomen: Soft/ND/NT, positive BS  Musculoskeletal: No Edema  Neuro: alert, oriented   Data Reviewed: Basic Metabolic Panel: Recent Labs  Lab 06/16/18 1330 06/17/18 0455 06/18/18 0457  NA 141 140 137  K 4.1 4.7 4.1  CL 107 104 101  CO2 23 27 25   GLUCOSE 193* 139* 132*  BUN 18 24* 35*  CREATININE 0.68 0.91 0.93  CALCIUM 9.4 9.3 9.3  MG 1.9  --   --   PHOS 3.2  --   --    Liver Function Tests: No results for input(s): AST, ALT, ALKPHOS, BILITOT, PROT, ALBUMIN in the last 168 hours. No results for input(s): LIPASE, AMYLASE in the last 168 hours. No results for input(s): AMMONIA in the last 168 hours. CBC: Recent Labs  Lab 06/16/18 1330  WBC 7.5  NEUTROABS 5.1  HGB 13.8  HCT 42.7  MCV 92.0  PLT 278   Cardiac Enzymes:   Recent Labs  Lab 06/16/18 1953  TROPONINI <0.03   BNP (last 3 results) Recent Labs    06/16/18  1330  BNP 417.2*    ProBNP (last 3 results) No results for input(s): PROBNP in the last 8760 hours.  CBG: Recent Labs  Lab 06/17/18 1735 06/17/18 2139 06/18/18 0726 06/18/18 1129 06/18/18 1534  GLUCAP 101* 118* 146* 109* 155*    No results found for this or any previous visit (from the past 240 hour(s)).   Studies: No results found.  Scheduled Meds: . [START ON 06/19/2018] aspirin  81 mg Oral Pre-Cath  . [START ON 06/20/2018] aspirin EC  81 mg Oral Daily  . atorvastatin  20 mg Oral q1800  . cholecalciferol  1,000 Units Oral Daily  . enoxaparin (LOVENOX) injection  40 mg  Subcutaneous Q24H  . famotidine  20 mg Oral BID  . furosemide  40 mg Oral Daily  . insulin aspart  0-15 Units Subcutaneous TID WC  . lisinopril  2.5 mg Oral Daily  . metFORMIN  850 mg Oral BID WC  . multivitamin with minerals  1 tablet Oral Daily  . potassium chloride  20 mEq Oral BID  . propafenone  150 mg Oral Q8H  . sodium chloride flush  3 mL Intravenous Q12H  . thiamine  100 mg Oral Daily    Continuous Infusions: . sodium chloride    . [START ON 06/19/2018] sodium chloride       Time spent: 62mins, case discussed with cardiology I have personally reviewed and interpreted on  06/18/2018 daily labs, tele strips, imagings as discussed above under date review session and assessment and plans.  I reviewed all nursing notes, pharmacy notes, consultant notes,  vitals, pertinent old records  I have discussed plan of care as described above with RN , patient and family on 06/18/2018   Florencia Reasons MD, PhD  [facility] Pager [phone]. If 7PM-7AM, please contact night-coverage at [email or web address], password [facility] 06/18/2018, 6:23 PM  LOS: 0 days

## 2018-06-18 NOTE — Progress Notes (Signed)
Per Cardiology Pt to have Cardiac Catheterization tomorrow. RN reviewed Cardiac Catheterization procedures with Pt and encouraged Pt to watch Video. Pt states "I feel comfortable with this test I have had one a few years ago." Pt encouraged to ask questions if any concerns arise. Consent signed

## 2018-06-18 NOTE — Consult Note (Signed)
Cardiology Consultation:   Patient ID: Emily [name] MRN: [id number]; DOB: [birth year]  Admit date: 06/16/2018 Date of Consult: 06/18/2018  Primary Care Provider: [name], MD Primary Cardiologist: Skeet Latch, MD  Primary Electrophysiologist:  None    Patient Profile:   Emily [name] is a 57 y.o. female with a hx of normal LHC in 4098, chronic diastolic HF, NSVT and frequent PVCs treated with propafenone, mitral regurgitation, type 2 diabetes, HLD and family history of premature CAD who is being seen today for the evaluation of acute CHF and abnormal echocardiogram, showing severely reduced LVEF at 25-30% and moderate to severe MR (ischemic appearing) at the request of Dr. Darrick Meigs, Internal Medicine.  History of Present Illness:   Emily [name] is followed by Dr. Oval Linsey. She was evaluated in 2016 for CP.  She had an ETT on 05/15/15 that was negative for ischemia.  Echo showed LVEF 55-60% with probably hypokinesis of the basal-mid inferolateral and inferior myocardium and grade 1 diastolic dysfunction. She subsequently underwent LHC, which showed no CAD.  Emily [name] also wore a 48 hour Holter that showed frequent PVCs and occasional PACs.  She was started on metoprolol but reported feeling depressed at her follow up appointment.  Metoprolol was discontinued and she was started on propafenone. She was last seen in clinic 07/2017 and was doing well from a symptom standpoint and her PVCs were noted to be much improved w/ propafenone.   She presented to the [facility] ED on 06/16/18 with CC of progressive SOB associated with severe paroxysmal cough and chest pressure. Symptoms started ~3 weeks ago and progressively worsening. Chest pressure/ tightness, dyspnea with exertion. Decreased exercise tolerance. Would give out and have to stop and rest just walking to her mail box. Also noted orthopnea, PND and increased abdominal girth. No significant LEE.   ED Course: EKG showed sinus tach 111 bpm, PVCs,  QT/QTc  351/477 ms. BP 130/90. O2 sats 98% on RA. POC troponin negative. BNP elevated at 417.2. CBC and BMP unremarkable. CXR w/ mild pulmonary interstitial edema and small effusions. IV Lasix given with improvement of symptoms. Emily [name] admitted by IM for acute CHF.  IV Lasix was continued. Good UOP thus far w/ 3.2 L out yesterday. Net I/Os negative 2.3 L since admit. Renal function and electrolytes stable. SCr 0.93 today and K 4.1.  2D echo obtained yesterday and demonstrated severely reduced LV systolic function with EF in the range of 25-30%. Doppler parameters c/w with both elevated ventricular end diastolic filling pressure and elevated left atrial filling pressure. Mitral valve function also appears abnormal w/ significant ischemic appearing MR w/ restricted posterior leaflet motion calcified annulus, moderately thickened moderately calcified leaflets and moderate to severe regurgitation.   She reports she is feeling better. Breathing has improved. Able to sleep almost completely flat last night. Currently CP free.     Past Medical History:  Diagnosis Date  . Allergy   . CHF (congestive heart failure) ([place])   . Chronic diastolic heart failure ([place]) 06/11/2015  . GERD (gastroesophageal reflux disease)   . NSVT (nonsustained ventricular tachycardia) ([place]) 06/13/2015  . PVC's (premature ventricular contractions) 06/11/2015  . Type 2 diabetes mellitus ([place])     Past Surgical History:  Procedure Laterality Date  . CARDIAC CATHETERIZATION N/A 06/19/2015   Procedure: Left Heart Cath and Coronary Angiography;  Surgeon: Leonie Man, MD;  Location: [place] [facility];  Service: Cardiovascular;  Laterality: N/A;  . TONSILLECTOMY     when she  was 3     Home Medications:  Prior to Admission medications   Medication Sig Start Date End Date Taking? Authorizing Provider  acetaminophen (TYLENOL) 500 MG tablet Take 500 mg by mouth every 6 (six) hours as needed for moderate pain.   Yes [provider]  aspirin 81 MG tablet Take 81 mg by mouth daily.   Yes [provider]  cholecalciferol (VITAMIN D) 1000 UNITS tablet Take 1,000 Units by mouth daily.   Yes [provider]  ibuprofen (ADVIL,MOTRIN) 200 MG tablet Take 200 mg by mouth every 6 (six) hours as needed for mild pain. Reported on 10/26/2015   Yes [provider]  metFORMIN (GLUCOPHAGE) 850 MG tablet TAKE 1 TABLET BY MOUTH TWICE A DAY WITH A MEAL 04/25/18  Yes Posey Boyer, MD  Multiple Vitamins-Minerals (MULTIVITAMIN WITH MINERALS) tablet Take 1 tablet by mouth daily.   Yes [provider]  nitroGLYCERIN (NITROSTAT) 0.4 MG SL tablet Place 1 tablet (0.4 mg total) under the tongue every 5 (five) minutes as needed for chest pain. 11/08/16  Yes Skeet Latch, MD  propafenone (RYTHMOL) 150 MG tablet TAKE 1 TABLET BY MOUTH EVERY 8 HOURS 05/21/18  Yes Skeet Latch, MD  thiamine (VITAMIN B-1) 100 MG tablet Take 100 mg by mouth daily.   Yes [provider]  blood glucose meter kit and supplies KIT Dispense based on patient and insurance preference. Use up to four times daily as directed. (FOR ICD-9 250.00, 250.01). 06/20/16   [name], MD  Blood Glucose Monitoring Suppl (ONE TOUCH ULTRA 2) w/Device KIT USE TO TEST UP TO 4 TIMES PER DAY. 08/04/16   [name], MD  glucose blood (ONE TOUCH ULTRA TEST) test strip USE TO TEST UP TO 4 TIMES A DAY. 07/13/17   [name], MD    Inpatient Medications: Scheduled Meds: . aspirin EC  81 mg Oral Daily  . atorvastatin  20 mg Oral q1800  . cholecalciferol  1,000 Units Oral Daily  . enoxaparin (LOVENOX) injection  40 mg Subcutaneous Q24H  . famotidine  20 mg Oral BID  . furosemide  40 mg Oral Daily  . insulin aspart  0-15 Units Subcutaneous TID WC  . lisinopril  2.5 mg Oral Daily  . metFORMIN  850 mg Oral BID WC  . multivitamin with minerals  1 tablet Oral Daily  . potassium chloride  20 mEq Oral BID  . propafenone  150 mg Oral Q8H  . thiamine   100 mg Oral Daily   Continuous Infusions:  PRN Meds: acetaminophen **OR** acetaminophen, ipratropium-albuterol, nitroGLYCERIN, ondansetron **OR** ondansetron (ZOFRAN) IV  Allergies:    Allergies  Allergen Reactions  . Hornet Venom Swelling  . Tetanus Toxoids Other (See Comments)    Big red rash and swelling in the muscle    Social History:   Social History   Socioeconomic History  . Marital status: Married    Spouse name: Not on file  . Number of children: 5  . Years of education: Not on file  . Highest education level: Not on file  Occupational History  . 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 Financial resource strain: Not on file  . Food insecurity:    Worry: Not on file    Inability: Not on file  . Transportation needs:    Medical: Not on file    Non-medical: Not on file  Tobacco Use  . Smoking status: Never Smoker  . Smokeless tobacco: Never  Used  Substance and Sexual Activity  . Alcohol use: No  . Drug use: No  . Sexual activity: Yes  Lifestyle  . Physical activity:    Days per week: Not on file    Minutes per session: Not on file  . Stress: Not on file  Relationships  . Social connections:    Talks on phone: Not on file    Gets together: Not on file    Attends religious service: Not on file    Active member of club or organization: Not on file    Attends meetings of clubs or organizations: Not on file    Relationship status: Not on file  . Intimate partner violence:    Fear of current or ex partner: Not on file    Emotionally abused: Not on file    Physically abused: Not on file    Forced sexual activity: Not on file  Other Topics Concern  . Not on file  Social History Narrative   Married.  Education: [employer].          Family History:    Family History  Problem Relation Age of Onset  . Heart disease Mother   . COPD Mother   . Emphysema Mother   . Cancer Mother        oral  . Heart disease Father   . Diabetes Father   . COPD Father   .  Vision loss Father   . Skin cancer Father        skin  . Hyperlipidemia Father   . Hypertension Father   . Heart disease Sister   . Graves' disease Sister   . Diabetes Sister   . Hyperlipidemia Sister   . Heart failure Sister 6  . Heart disease Brother   . Hyperlipidemia Brother   . Cancer Paternal Grandmother        unknown  . Prostate cancer Paternal Grandfather        Prostate cancer     ROS:  Please see the history of present illness.   All other ROS reviewed and negative.     Physical Exam/Data:   Vitals:   06/17/18 1456 06/17/18 2138 06/18/18 0507 06/18/18 0947  BP: 102/74 90/61 104/70 110/72  Pulse: (!) 104 94 91   Resp: '18 16 14   ' Temp: 98.1 F (36.7 C) 98.5 F (36.9 C) 97.7 F (36.5 C)   TempSrc: Oral Oral Oral   SpO2: 99% 95% 97%   Weight:   74.2 kg   Height:   '4\' 11"'  (1.499 m)     Intake/Output Summary (Last 24 hours) at 06/18/2018 1023 Last data filed at 06/18/2018 0500 Gross per 24 hour  Intake 350 ml  Output 3200 ml  Net -2850 ml   Filed Weights   06/16/18 1727 06/17/18 0504 06/18/18 0507  Weight: 76.6 kg 75.5 kg 74.2 kg   Body mass index is 33.02 kg/m.  General:  Well nourished, well developed, in no acute distress HEENT: normal Lymph: no adenopathy Neck: no JVD Endocrine:  No thryomegaly Vascular: No carotid bruits; FA pulses 2+ bilaterally without bruits  Cardiac:  normal S1, S2; RRR; no murmur  Lungs:  clear to auscultation bilaterally, no wheezing, rhonchi or rales  Abd: soft, nontender, no hepatomegaly  Ext: no edema Musculoskeletal:  No deformities, BUE and BLE strength normal and equal Skin: warm and dry  Neuro:  CNs 2-12 intact, no focal abnormalities noted Psych:  Normal affect   EKG:  The  EKG was personally reviewed and demonstrates:  06/17/18 Sinus rhythm with occasional Premature ventricular complexes, Low voltage QRS Prolonged QT  382/492 ms Telemetry:  Telemetry was personally reviewed and demonstrates:  NSR. Rare  PVCs   Relevant CV Studies: LHC 06/19/15 Procedures   Left Heart Cath and Coronary Angiography  Conclusion   1. Angiographically normal coronary arteries 2. There is possible mild left ventricular systolic dysfunction - difficult to assess due to ectopy    Relatively normal angiography with difficult to interpret LV gram.  The patient did have frequent ectopy which could be a source of her symptoms. Would evaluate for nonischemic etiology for NSVT and chest pain.    2D Echo 05/27/15 Study Conclusions  - Left ventricle: The cavity size was mildly dilated. Systolic   function was normal. The estimated ejection fraction was in the   range of 55% to 60%. Probable hypokinesis of the   basal-midinferolateral and inferior myocardium. There was an   increased relative contribution of atrial contraction to   ventricular filling. Doppler parameters are consistent with   abnormal left ventricular relaxation (grade 1 diastolic   dysfunction). - Aortic valve: Trileaflet; normal thickness, mildly calcified   leaflets. - Mitral valve: There was mild regurgitation.   2D Echo (this admission) 06/17/18 Study Conclusions  - Left ventricle: Septal apical and inferior wall hypokinesis   Systolic function was severely reduced. The estimated ejection   fraction was in the range of 25% to 30%. Doppler parameters are   consistent with both elevated ventricular end-diastolic filling   pressure and elevated left atrial filling pressure. - Mitral valve: Significant ischemic appearing MR with restricted   posterior leafelt motion Calcified annulus. Moderately thickened,   moderately calcified leaflets . There was moderate to severe   regurgitation. - Left atrium: The atrium was mildly dilated. - Atrial septum: No defect or patent foramen ovale was identified.   Laboratory Data:  Chemistry Recent Labs  Lab 06/16/18 1330 06/17/18 0455 06/18/18 0457  NA 141 140 137  K 4.1 4.7 4.1    CL 107 104 101  CO2 '23 27 25  ' GLUCOSE 193* 139* 132*  BUN 18 24* 35*  CREATININE 0.68 0.91 0.93  CALCIUM 9.4 9.3 9.3  GFRNONAA >60 >60 >60  GFRAA >60 >60 >60  ANIONGAP '11 9 11    ' No results for input(s): PROT, ALBUMIN, AST, ALT, ALKPHOS, BILITOT in the last 168 hours. Hematology Recent Labs  Lab 06/16/18 1330  WBC 7.5  RBC 4.64  HGB 13.8  HCT 42.7  MCV 92.0  MCH 29.7  MCHC 32.3  RDW 14.1  PLT 278   Cardiac Enzymes Recent Labs  Lab 06/16/18 1953  TROPONINI <0.03    Recent Labs  Lab 06/16/18 1351  TROPIPOC 0.04    BNP Recent Labs  Lab 06/16/18 1330  BNP 417.2*    DDimer No results for input(s): DDIMER in the last 168 hours.  Radiology/Studies:  Dg Chest 2 View  Result Date: 06/16/2018 CLINICAL DATA:  Emily [name] reports being sent here from UC with fluid on her lungs. Emily [name] reports she has been SHOB, fatigue, and cough for the last 3 weeks. Emily [name] has hx of CHF, Type 2 Diabetes. Nonsmoker. EXAM: CHEST - 2 VIEW COMPARISON:  03/23/2015 FINDINGS: Increase in central pulmonary vascular congestion. Worsening perihilar and bibasilar interstitial edema or infiltrates with probable septal lines peripherally. New small bilateral pleural effusions. Heart size upper limits normal. No pneumothorax. Visualized bones unremarkable. IMPRESSION: Probable mild pulmonary interstitial edema  and small effusions, new since previous Electronically Signed   By: Lucrezia Europe M.D.   On: 06/16/2018 14:36    Assessment and Plan:   Emily [name] is a 57 y.o. female with a hx of normal LHC in 8295, chronic diastolic HF, NSVT and frequent PVCs treated with propafenone, mitral regurgitation, type 2 diabetes, HLD and family history of premature CAD who is being seen today for the evaluation of acute CHF and abnormal echocardiogram, showing severely reduced LVEF at 25-30% and moderate to severe MR (ischemic appearing) at the request of Dr. Darrick Meigs, Internal Medicine.  1. Acute Combined Systolic and Diastolic CHF: in  the setting of systolic and diastolic dysfunction. Admit BNP elevated at 417 and CXR showed mild pulmonary interstitial edema and small effusions. She has had good response to diuretics. Symptoms improved. Able to lay almost completely flat last night. No resting dyspnea.  Net I/Os negative 2L since admit. Renal function and electrolytes have remained stable. BP is well controlled. Appears close to euvolemic on exam. If RHC is persued, can use pressure measurements help guide further diuresis. Continue strict I/Os, daily weights, low sodium diet and close monitoring of renal function, electrolytes and BP while diuresing.   2. Cardiomyopathy: New. EF now 25-30%, previously 55-60% in 2016. She had a LHC in 2016 that showed no CAD. However given new LV dysfunction, chest pain and worsening MR, repeat R/LHC is warranted. Given her prior history of NSVT and frequent PVCs, also ? tachymediated CM. She has previously done well with propaherone, but we may need to consider repeat Holter monitor to assess for recurrence of high burden PVCs. We will also need to treat w/ guidelines directed medical therapy for systolic HF, as BP allows. If BP will tolerate, would recommend low dose Entresto and low dose  blocker (try carvedilol given prior intolerance to metoprolol)  3. Mitral Regurgitation: prior echo in 2016 showed only mild MR. 2D echo yesterday showed significant ischemic appearing MR w/ restricted posterior leaflet motion and moderate to severe MR. She may need TEE to better evaluate and repeat R/LHC given newly reduced LVEF.   4. PVCs/ NSVT: 48 hour Holter in 2016 showed frequent PVCs/ NSVT and occasional PACs in the setting of normal LHC. She did not tolerate metoprolol (fatigue/ depression). Propafenone added and Emily [name] noted to have reduced ectopy. Given new reduction of LVF, we will need to monitor for breakthrough recurrence. ? If tachy mediated cardiomyopathy. Monitor on tele. I personally reviewed tele today.  Rare PVCs. No NSVT overnight. If LHC is normal, she may need repeat evaluation with 48 hr Holter post discharge to assess PVC burden.   5. HLD: Recent lipid panel 04/2018 showed elevated LDL at 153 mg/dL. Given her cardiac risk factors including FH of premature CAD, HTN and T2DM, would recommend increasing atorvastatin dose to 40 mg nightly.   6. HTN: controlled on current regimen. SBP however a bit soft in the low 100s . Given her new systolic HF, she will need change in regimen. She is currently on a low dose ACEi (lisinopril) and should be considered for Rush Barer (will need 36 hr lisinopril washout period). Also will need to consider retrial of  blocker. Per office notes, she did not tolerate metoprolol in the past. If BP later allows, may consider low dose carvedilol.   7. Type 2DM: management per primary.   For questions or updates, please contact [place] Please consult [email or web address] for contact info under     Signed, [employer]  Rosita Fire, PA-C  06/18/2018 10:23 AM

## 2018-06-19 ENCOUNTER — Encounter (HOSPITAL_COMMUNITY): Payer: Self-pay | Admitting: Cardiology

## 2018-06-19 ENCOUNTER — Encounter (HOSPITAL_COMMUNITY)
Admission: EM | Disposition: A | Discharge status: Home / Self Care | Payer: Self-pay | Source: Home / Self Care | Attending: Emergency Medicine

## 2018-06-19 DIAGNOSIS — I5043 Acute on chronic combined systolic (congestive) and diastolic (congestive) heart failure: Secondary | ICD-10-CM | POA: Diagnosis not present

## 2018-06-19 DIAGNOSIS — E669 Obesity, unspecified: Secondary | ICD-10-CM

## 2018-06-19 DIAGNOSIS — I5033 Acute on chronic diastolic (congestive) heart failure: Secondary | ICD-10-CM | POA: Diagnosis not present

## 2018-06-19 DIAGNOSIS — I340 Nonrheumatic mitral (valve) insufficiency: Secondary | ICD-10-CM | POA: Diagnosis present

## 2018-06-19 DIAGNOSIS — I429 Cardiomyopathy, unspecified: Secondary | ICD-10-CM

## 2018-06-19 DIAGNOSIS — R0602 Shortness of breath: Secondary | ICD-10-CM | POA: Diagnosis not present

## 2018-06-19 DIAGNOSIS — I110 Hypertensive heart disease with heart failure: Secondary | ICD-10-CM | POA: Diagnosis not present

## 2018-06-19 DIAGNOSIS — E7800 Pure hypercholesterolemia, unspecified: Secondary | ICD-10-CM | POA: Diagnosis not present

## 2018-06-19 DIAGNOSIS — I472 Ventricular tachycardia: Secondary | ICD-10-CM | POA: Diagnosis not present

## 2018-06-19 HISTORY — PX: RIGHT/LEFT HEART CATH AND CORONARY ANGIOGRAPHY: CATH118266

## 2018-06-19 LAB — GLUCOSE, CAPILLARY
GLUCOSE-CAPILLARY: 152 mg/dL — AB (ref 70–99)
GLUCOSE-CAPILLARY: 146 mg/dL — AB (ref 70–99)
Glucose-Capillary: 144 mg/dL — ABNORMAL HIGH (ref 70–99)
Glucose-Capillary: 128 mg/dL — ABNORMAL HIGH (ref 70–99)
Glucose-Capillary: 122 mg/dL — ABNORMAL HIGH (ref 70–99)

## 2018-06-19 LAB — POCT I-STAT 3, VENOUS BLOOD GAS (G3P V)
Acid-Base Excess: 1 mmol/L (ref 0.0–2.0)
BICARBONATE: 25.5 mmol/L (ref 20.0–28.0)
Bicarbonate: 26.3 mmol/L (ref 20.0–28.0)
O2 Saturation: 58 %
O2 Saturation: 64 %
PCO2 VEN: 42.9 mmHg — AB (ref 44.0–60.0)
PCO2 VEN: 43.9 mmHg — AB (ref 44.0–60.0)
PH VEN: 7.382 (ref 7.250–7.430)
PO2 VEN: 31 mmHg — AB (ref 32.0–45.0)
PO2 VEN: 34 mmHg (ref 32.0–45.0)
TCO2: 27 mmol/L (ref 22–32)
TCO2: 28 mmol/L (ref 22–32)
pH, Ven: 7.386 (ref 7.250–7.430)

## 2018-06-19 LAB — POCT I-STAT 3, ART BLOOD GAS (G3+)
ACID-BASE DEFICIT: 1 mmol/L (ref 0.0–2.0)
BICARBONATE: 23.4 mmol/L (ref 20.0–28.0)
O2 SAT: 95 %
PCO2 ART: 36.7 mmHg (ref 32.0–48.0)
PO2 ART: 76 mmHg — AB (ref 83.0–108.0)
TCO2: 24 mmol/L (ref 22–32)
pH, Arterial: 7.413 (ref 7.350–7.450)

## 2018-06-19 LAB — CREATININE, SERUM
CREATININE: 0.83 mg/dL (ref 0.44–1.00)
GFR calc Af Amer: >60 mL/min (ref >60)

## 2018-06-19 LAB — CBC
HCT: 40.9 % (ref 36.0–46.0)
HEMOGLOBIN: 13.6 g/dL (ref 12.0–15.0)
MCH: 29.3 pg (ref 26.0–34.0)
MCHC: 33.3 g/dL (ref 30.0–36.0)
MCV: 88.1 fL (ref 80.0–100.0)
NRBC: 0 % (ref 0.0–0.2)
Platelets: 260 10*3/uL (ref 150–400)
RBC: 4.64 MIL/uL (ref 3.87–5.11)
RDW: 13.7 % (ref 11.5–15.5)
WBC: 5.7 10*3/uL (ref 4.0–10.5)

## 2018-06-19 LAB — BASIC METABOLIC PANEL
Anion gap: 8 (ref 5–15)
BUN: 33 mg/dL — ABNORMAL HIGH (ref 6–20)
CALCIUM: 9.3 mg/dL (ref 8.9–10.3)
CO2: 29 mmol/L (ref 22–32)
CREATININE: 0.92 mg/dL (ref 0.44–1.00)
Chloride: 101 mmol/L (ref 98–111)
GFR calc non Af Amer: >60 mL/min (ref >60)
Glucose, Bld: 137 mg/dL — ABNORMAL HIGH (ref 70–99)
Potassium: 4.3 mmol/L (ref 3.5–5.1)
SODIUM: 138 mmol/L (ref 135–145)

## 2018-06-19 LAB — SEDIMENTATION RATE: SED RATE: 10 mm/h (ref 0–22)

## 2018-06-19 LAB — MAGNESIUM: Magnesium: 2.2 mg/dL (ref 1.7–2.4)

## 2018-06-19 LAB — TSH: TSH: 1.649 u[IU]/mL (ref 0.350–4.500)

## 2018-06-19 SURGERY — RIGHT/LEFT HEART CATH AND CORONARY ANGIOGRAPHY
Anesthesia: LOCAL

## 2018-06-19 MED ORDER — ACETAMINOPHEN 325 MG PO TABS: 650.0000 mg | ORAL_TABLET | ORAL | Status: DC | PRN

## 2018-06-19 MED ORDER — FENTANYL CITRATE (PF) 100 MCG/2ML IJ SOLN
INTRAMUSCULAR | Status: DC | PRN
  Administered 2018-06-19 (×2): 25 ug via INTRAVENOUS

## 2018-06-19 MED ORDER — MIDAZOLAM HCL 2 MG/2ML IJ SOLN
INTRAMUSCULAR | Status: DC | PRN
  Administered 2018-06-19 (×2): 1 mg via INTRAVENOUS

## 2018-06-19 MED ORDER — MIDAZOLAM HCL 2 MG/2ML IJ SOLN
INTRAMUSCULAR | Status: AC
  Filled 2018-06-19: qty 2

## 2018-06-19 MED ORDER — LIDOCAINE HCL (PF) 1 % IJ SOLN
INTRAMUSCULAR | Status: AC
  Filled 2018-06-19: qty 30

## 2018-06-19 MED ORDER — SODIUM CHLORIDE 0.9% FLUSH: 3.0000 mL | INTRAVENOUS | Status: DC | PRN

## 2018-06-19 MED ORDER — SODIUM CHLORIDE 0.9 % IV SOLN: 250.0000 mL | INTRAVENOUS | Status: DC | PRN

## 2018-06-19 MED ORDER — SODIUM CHLORIDE 0.9 % IV SOLN: INTRAVENOUS | Status: DC

## 2018-06-19 MED ORDER — LIDOCAINE HCL (PF) 1 % IJ SOLN
INTRAMUSCULAR | Status: DC | PRN
  Administered 2018-06-19: 15 mL

## 2018-06-19 MED ORDER — HEPARIN (PORCINE) IN NACL 1000-0.9 UT/500ML-% IV SOLN
INTRAVENOUS | Status: DC | PRN
  Administered 2018-06-19: 500 mL

## 2018-06-19 MED ORDER — SODIUM CHLORIDE 0.9 % IV SOLN
INTRAVENOUS | Status: AC
  Administered 2018-06-19: 15:00:00 via INTRAVENOUS

## 2018-06-19 MED ORDER — DIAZEPAM 5 MG PO TABS: 5.0000 mg | ORAL_TABLET | ORAL | Status: DC | PRN

## 2018-06-19 MED ORDER — ONDANSETRON HCL 4 MG/2ML IJ SOLN: 4.0000 mg | Freq: Four times a day (QID) | INTRAMUSCULAR | Status: DC | PRN

## 2018-06-19 MED ORDER — IOHEXOL 350 MG/ML SOLN
INTRAVENOUS | Status: DC | PRN
  Administered 2018-06-19: 40 mL via INTRA_ARTERIAL

## 2018-06-19 MED ORDER — FENTANYL CITRATE (PF) 100 MCG/2ML IJ SOLN
INTRAMUSCULAR | Status: AC
  Filled 2018-06-19: qty 2

## 2018-06-19 MED ORDER — HEPARIN SODIUM (PORCINE) 5000 UNIT/ML IJ SOLN
5000.0000 [IU] | Freq: Three times a day (TID) | INTRAMUSCULAR | Status: DC
  Administered 2018-06-20 – 2018-06-21 (×4): 5000 [IU] via SUBCUTANEOUS
  Filled 2018-06-19 (×5): qty 1

## 2018-06-19 MED ORDER — SODIUM CHLORIDE 0.9% FLUSH
3.0000 mL | Freq: Two times a day (BID) | INTRAVENOUS | Status: DC
  Administered 2018-06-19 – 2018-06-21 (×3): 3 mL via INTRAVENOUS

## 2018-06-19 MED ORDER — HEPARIN (PORCINE) IN NACL 1000-0.9 UT/500ML-% IV SOLN
INTRAVENOUS | Status: AC
  Filled 2018-06-19: qty 1000

## 2018-06-19 SURGICAL SUPPLY — 11 items
CATH INFINITI 5FR ANG PIGTAIL (CATHETERS) ×2 IMPLANT
CATH INFINITI MULTIPACK ST 5F (CATHETERS) ×2 IMPLANT
CATH SWAN GANZ 7F STRAIGHT (CATHETERS) ×2 IMPLANT
KIT HEART LEFT (KITS) ×2 IMPLANT
PACK CARDIAC CATHETERIZATION (CUSTOM PROCEDURE TRAY) ×2 IMPLANT
SHEATH PINNACLE 5F 10CM (SHEATH) ×2 IMPLANT
SHEATH PINNACLE 7F 10CM (SHEATH) ×2 IMPLANT
SYR MEDRAD MARK V 150ML (SYRINGE) ×2 IMPLANT
TRANSDUCER W/STOPCOCK (MISCELLANEOUS) ×2 IMPLANT
TUBING CIL FLEX 10 FLL-RA (TUBING) ×2 IMPLANT
WIRE EMERALD 3MM-J .035X150CM (WIRE) ×2 IMPLANT

## 2018-06-19 NOTE — Interval H&P Note (Signed)
Cath Lab Visit (complete for each Cath Lab visit)  Clinical Evaluation Leading to the Procedure:   ACS: No.  Non-ACS:    Anginal Classification: CCS III  Anti-ischemic medical therapy: No Therapy  Non-Invasive Test Results: No non-invasive testing performed  Prior CABG: No previous CABG      History and Physical Interval Note:  06/19/2018 11:50 AM  Emily [name]  has presented today for surgery, with the diagnosis of mr  The various methods of treatment have been discussed with the patient and family. After consideration of risks, benefits and other options for treatment, the patient has consented to  Procedure(s): RIGHT/LEFT HEART CATH AND CORONARY ANGIOGRAPHY (N/A) as a surgical intervention .  [name] history has been reviewed, patient examined, no change in status, stable for surgery.  I have reviewed the patient's chart and labs.  Questions were answered to the patient's satisfaction.     Shelva Majestic

## 2018-06-19 NOTE — Progress Notes (Signed)
Patient arrived to unit, right groin level 0. Complains of nausea, asked for crackers and ginger ale.

## 2018-06-19 NOTE — Progress Notes (Signed)
rfa and rfv site level 0 prior to sheath removal. 5 fr rfa sheath removed after explaining procedure to patient. Manual pressure held x 30 with hemostasis achieved. 7 fr rfv sheath removed at 1315- hemostastis achieved after manual pressure held. Pt. Tolerated well- r dp + 2 - a dry sterile dressing applied to rfa/rfv- pt advised if she laughs,coughs, sneezed, or bears down to hold pressue, pt advised to keep head on pillow- if she feels wet/warmth between legs to call RN immediately- it an emergency- for arterial bleed. Pt verbally acknowledges understanding of instructions as she repeats them back to me. Pt hand guided to rfa/rfv dressing- pt w/o complaints.

## 2018-06-19 NOTE — Progress Notes (Signed)
Progress Note  Patient Name: REESA GOTSCHALL Date of Encounter: 06/19/2018  Primary Cardiologist: Skeet Latch, MD   Subjective   Doing ok this morning in regards to symptoms. Denies resting dyspnea. No orthopnea/PND last night. No chest pain. A bit anxious about cath. Husband present at bedside.    Inpatient Medications    Scheduled Meds: . [START ON 06/20/2018] aspirin EC  81 mg Oral Daily  . atorvastatin  20 mg Oral q1800  . cholecalciferol  1,000 Units Oral Daily  . enoxaparin (LOVENOX) injection  40 mg Subcutaneous Q24H  . famotidine  20 mg Oral BID  . furosemide  40 mg Oral Daily  . insulin aspart  0-15 Units Subcutaneous TID WC  . lisinopril  2.5 mg Oral Daily  . multivitamin with minerals  1 tablet Oral Daily  . propafenone  150 mg Oral Q8H  . sodium chloride flush  3 mL Intravenous Q12H  . thiamine  100 mg Oral Daily   Continuous Infusions: . sodium chloride    . sodium chloride 10 mL/hr at 06/19/18 0629   PRN Meds: sodium chloride, acetaminophen **OR** acetaminophen, ipratropium-albuterol, nitroGLYCERIN, ondansetron **OR** ondansetron (ZOFRAN) IV, sodium chloride flush   Vital Signs    Vitals:   06/18/18 1504 06/18/18 2100 06/19/18 0412 06/19/18 0745  BP:  108/73 101/74 103/68  Pulse:  98 96 97  Resp:  18 12 16   Temp:  98.1 F (36.7 C) 98.4 F (36.9 C) 98.1 F (36.7 C)  TempSrc:  Oral Oral Oral  SpO2:  98% 99% 98%  Weight: 74.5 kg  76.6 kg   Height:        Intake/Output Summary (Last 24 hours) at 06/19/2018 0933 Last data filed at 06/19/2018 6301 Gross per 24 hour  Intake 128.26 ml  Output 1350 ml  Net -1221.74 ml   Filed Weights   06/18/18 0507 06/18/18 1504 06/19/18 0412  Weight: 74.2 kg 74.5 kg 76.6 kg    Telemetry    NSR, rare PVCs- Personally Reviewed  ECG    SR w/ PVCs - Personally Reviewed  Physical Exam   GEN: middle aged WF in No acute distress.   Neck: No JVD Cardiac: RRR, no murmurs, rubs, or gallops.  Respiratory:  Clear to auscultation bilaterally. GI: Soft, nontender, non-distended  MS: No edema; No deformity. Neuro:  Nonfocal  Psych: Normal affect   Labs    Chemistry Recent Labs  Lab 06/17/18 0455 06/18/18 0457 06/19/18 0519  NA 140 137 138  K 4.7 4.1 4.3  CL 104 101 101  CO2 27 25 29   GLUCOSE 139* 132* 137*  BUN 24* 35* 33*  CREATININE 0.91 0.93 0.92  CALCIUM 9.3 9.3 9.3  GFRNONAA >60 >60 >60  GFRAA >60 >60 >60  ANIONGAP 9 11 8      Hematology Recent Labs  Lab 06/16/18 1330  WBC 7.5  RBC 4.64  HGB 13.8  HCT 42.7  MCV 92.0  MCH 29.7  MCHC 32.3  RDW 14.1  PLT 278    Cardiac Enzymes Recent Labs  Lab 06/16/18 1953  TROPONINI <0.03    Recent Labs  Lab 06/16/18 1351  TROPIPOC 0.04     BNP Recent Labs  Lab 06/16/18 1330  BNP 417.2*     DDimer No results for input(s): DDIMER in the last 168 hours.   Radiology    No results found.  Cardiac Studies   LHC 06/19/15 Procedures   Left Heart Cath and Coronary Angiography  Conclusion  1. Angiographically normal coronary arteries 2. There is possible mild left ventricular systolic dysfunction - difficult to assess due to ectopy   Relatively normal angiography with difficult to interpret LV gram.  The patient did have frequent ectopy which could be a source of her symptoms. Would evaluate for nonischemic etiology for NSVT and chest pain.    2D Echo 05/27/15 Study Conclusions  - Left ventricle: The cavity size was mildly dilated. Systolic function was normal. The estimated ejection fraction was in the range of 55% to 60%. Probable hypokinesis of the basal-midinferolateral and inferior myocardium. There was an increased relative contribution of atrial contraction to ventricular filling. Doppler parameters are consistent with abnormal left ventricular relaxation (grade 1 diastolic dysfunction). - Aortic valve: Trileaflet; normal thickness, mildly calcified leaflets. -  Mitral valve: There was mild regurgitation.   2D Echo (this admission) 06/17/18 Study Conclusions  - Left ventricle: Septal apical and inferior wall hypokinesis Systolic function was severely reduced. The estimated ejection fraction was in the range of 25% to 30%. Doppler parameters are consistent with both elevated ventricular end-diastolic filling pressure and elevated left atrial filling pressure. - Mitral valve: Significant ischemic appearing MR with restricted posterior leafelt motion Calcified annulus. Moderately thickened, moderately calcified leaflets . There was moderate to severe regurgitation. - Left atrium: The atrium was mildly dilated. - Atrial septum: No defect or patent foramen ovale was identified.   Patient Profile     IVANNAH ZODY is a 57 y.o. female with a hx of normal LHC in 3151, chronic diastolic HF, NSVT and frequent PVCs treated with propafenone, mitral regurgitation, type 2 diabetes, HLD and family history of premature CAD who is being seen today for the evaluation of acute CHF and abnormal echocardiogram, showing severely reduced LVEF at 25-30% and moderate to severe MR (ischemic appearing) at the request of Dr. Darrick Meigs, Internal Medicine.  Assessment & Plan    1. Acute Combined Systolic and Diastolic CHF: in the setting of systolic and diastolic dysfunction. Admit BNP elevated at 417 and CXR showed mild pulmonary interstitial edema and small effusions. She has had good response to diuretics. Symptoms improved. 1.3L in UOP yesterday. Net I/Os negative 3.6L since admit. Renal function and electrolytes have remained stable. BP is well controlled. Appears close to euvolemic on exam. If RHC is persued, can use pressure measurements help guide further diuresis. Continue strict I/Os, daily weights, low sodium diet and close monitoring of renal function, electrolytes and BP while diuresing.   2. Cardiomyopathy: New. EF now 25-30%, previously 55-60% in  2016. She had a LHC in 2016 that showed no CAD. However given new LV dysfunction, chest pain and worsening MR, repeat R/LHC is warranted. This is [facility] at [facility]. Given her prior history of NSVT and frequent PVCs, also ? tachymediated CM. She has previously done well with propaherone, but we may need to consider repeat Holter monitor to assess for recurrence of high burden PVCs. We will also need to treat w/ guidelines directed medical therapy for systolic HF, as BP allows. If BP will tolerate, would recommend low dose Entresto and low dose ? blocker (try carvedilol given prior intolerance to metoprolol). If we transition to [facility], she will need 36 hr ACEi washout.   3. Mitral Regurgitation: prior echo in 2016 showed only mild MR. 2D echo 06/17/18 showed significant ischemic appearing MR w/ restricted posterior leaflet motion and moderate to severe MR. She will need TEE to better evaluate and repeat R/LHC given newly reduced LVEF.  4. PVCs/ NSVT: 48 hour Holter in 2016 showed frequent PVCs/ NSVT and occasional PACs in the setting of normal LHC. She did not tolerate metoprolol (fatigue/ depression). Propafenone added and pt noted to have reduced ectopy. Given new reduction of LVF, we will continue to monitor for breakthrough recurrence. ? If tachy mediated cardiomyopathy. So far, there have been only a few PVCs and no NSVT on tele review.   5. HLD: Recent lipid panel 04/2018 showed elevated LDL at 153 mg/dL. Given her cardiac risk factors including FH of premature CAD, HTN and T2DM, would recommend increasing atorvastatin dose to 40 mg nightly.   6. HTN: controlled on current regimen. SBP however a bit soft in the low 100s . Given her new systolic HF, she will ultimately need change in regimen. She is currently on a low dose ACEi (lisinopril) and should be considered for Rush Barer (will need 36 hr lisinopril washout period). Also will need to consider retrial of ? blocker. Per office notes,  she did not tolerate metoprolol in the past. If BP later allows, may consider low dose carvedilol.   7. Type 2DM: management per primary.    For questions or updates, please contact [place] Please consult [email or web address] for contact info under        Signed, Lyda Jester, PA-C  06/19/2018, 9:33 AM

## 2018-06-19 NOTE — Progress Notes (Signed)
PROGRESS NOTE  Emily [name] XBL:[id number] DOB: 08-Sep-1960 DOA: 06/16/2018 PCP: [name], MD  HPI/Recap of past 24 hours:  No chest pain or pressure, edema has resolved,  no cough, no hypoxia She has some nasal congestion, she does not want anything for it now She is anxious about the procedure, she does not want anything interfere it  No fever Husband at bedside  Assessment/Plan: Principal Problem:   Acute on chronic diastolic congestive heart failure (HCC) Active Problems:   Elevated LDL cholesterol level   Type 2 diabetes mellitus (HCC)   NSVT (nonsustained ventricular tachycardia) (HCC)   GERD (gastroesophageal reflux disease)   Obesity (BMI 30.0-34.9)   Acute combined systolic and diastolic heart failure (HCC)    Acute on chronic combined heart failure -h/o normal LHC in 3007, chronic diastolic HF, -echocardiogram this hospitalization showed "new reduced lvef 25-30%,  with moderate to severe mitral regurgitation" -cxr on presentation "Probable mild pulmonary interstitial edema and small effusions, new since previous" -she is diuresed , low extremity edema has resolved, lung exam clear, reports feeling much better -cardiology consulted to have TEE /left and right heart cath , will follow cardiology recommendation  NSVT, h/o frequent pvc's on rhtymol H/o betablocker intolerance due to feeling depressed on it Cardiology consulted   noninsulin dependent DM2, controlled -Recent a1c 6.5 -hold home meds metformin in the hospital, resume at discharge -currently on ssi  Obesity: Body mass index is 34.11 kg/m.     Code Status: full  Family Communication: patient and husband  Disposition Plan: transfer [facility] [place] to [place] for cardiac cath today   Consultants:  cardiology  Procedures:  TEE, right and left heart cath planned  Antibiotics:  none   Objective: BP 103/68 (BP Location: Left Arm)   Pulse 97   Temp 98.1 F (36.7 C) (Oral)    Resp 16   Ht 4\' 11"  (1.499 m)   Wt 76.6 kg   LMP 10/14/2014   SpO2 98%   BMI 34.11 kg/m   Intake/Output Summary (Last 24 hours) at 06/19/2018 0831 Last data filed at 06/19/2018 6226 Gross per 24 hour  Intake 128.26 ml  Output 1350 ml  Net -1221.74 ml   Filed Weights   06/18/18 0507 06/18/18 1504 06/19/18 0412  Weight: 74.2 kg 74.5 kg 76.6 kg    Exam: Patient is examined daily including today on 06/19/2018, exams remain the same as of yesterday except that has changed    General:  NAD  Cardiovascular: RRR  Respiratory: CTABL  Abdomen: Soft/ND/NT, positive BS  Musculoskeletal: No Edema  Neuro: alert, oriented   Data Reviewed: Basic Metabolic Panel: Recent Labs  Lab 06/16/18 1330 06/17/18 0455 06/18/18 0457 06/19/18 0519  NA 141 140 137 138  K 4.1 4.7 4.1 4.3  CL 107 104 101 101  CO2 23 27 25 29   GLUCOSE 193* 139* 132* 137*  BUN 18 24* 35* 33*  CREATININE 0.68 0.91 0.93 0.92  CALCIUM 9.4 9.3 9.3 9.3  MG 1.9  --   --  2.2  PHOS 3.2  --   --   --    Liver Function Tests: No results for input(s): AST, ALT, ALKPHOS, BILITOT, PROT, ALBUMIN in the last 168 hours. No results for input(s): LIPASE, AMYLASE in the last 168 hours. No results for input(s): AMMONIA in the last 168 hours. CBC: Recent Labs  Lab 06/16/18 1330  WBC 7.5  NEUTROABS 5.1  HGB 13.8  HCT 42.7  MCV 92.0  PLT 278   Cardiac Enzymes:   Recent Labs  Lab 06/16/18 1953  TROPONINI <0.03   BNP (last 3 results) Recent Labs    06/16/18 1330  BNP 417.2*    ProBNP (last 3 results) No results for input(s): PROBNP in the last 8760 hours.  CBG: Recent Labs  Lab 06/18/18 0726 06/18/18 1129 06/18/18 1534 06/18/18 2102 06/19/18 0753  GLUCAP 146* 109* 155* 124* 152*    No results found for this or any previous visit (from the past 240 hour(s)).   Studies: No results found.  Scheduled Meds: . [START ON 06/20/2018] aspirin EC  81 mg Oral Daily  . atorvastatin  20 mg Oral q1800    . cholecalciferol  1,000 Units Oral Daily  . enoxaparin (LOVENOX) injection  40 mg Subcutaneous Q24H  . famotidine  20 mg Oral BID  . furosemide  40 mg Oral Daily  . insulin aspart  0-15 Units Subcutaneous TID WC  . lisinopril  2.5 mg Oral Daily  . multivitamin with minerals  1 tablet Oral Daily  . propafenone  150 mg Oral Q8H  . sodium chloride flush  3 mL Intravenous Q12H  . thiamine  100 mg Oral Daily    Continuous Infusions: . sodium chloride    . sodium chloride 10 mL/hr at 06/19/18 1914     Time spent: 25 I have personally reviewed and interpreted on  06/19/2018 daily labs, tele strips, imagings as discussed above under date review session and assessment and plans.  I reviewed all nursing notes, pharmacy notes, consultant notes,  vitals, pertinent old records  I have discussed plan of care as described above with RN , patient and family on 06/19/2018   Florencia Reasons MD, PhD  [facility] Pager [phone]. If 7PM-7AM, please contact night-coverage at [email or web address], password [email or web address] 06/19/2018, 8:31 AM  LOS: 0 days

## 2018-06-19 NOTE — Plan of Care (Signed)
  Problem: Education: Goal: Knowledge of General Education information will improve Description Including pain rating scale, medication(s)/side effects and non-pharmacologic comfort measures Outcome: Progressing   Problem: Health Behavior/Discharge Planning: Goal: Ability to manage health-related needs will improve Outcome: Progressing   

## 2018-06-20 ENCOUNTER — Observation Stay (HOSPITAL_COMMUNITY): Payer: 59

## 2018-06-20 ENCOUNTER — Encounter (HOSPITAL_COMMUNITY)
Admission: EM | Disposition: A | Discharge status: Home / Self Care | Payer: Self-pay | Source: Home / Self Care | Attending: Emergency Medicine

## 2018-06-20 ENCOUNTER — Encounter (HOSPITAL_COMMUNITY): Payer: Self-pay | Admitting: *Deleted

## 2018-06-20 ENCOUNTER — Observation Stay (HOSPITAL_BASED_OUTPATIENT_CLINIC_OR_DEPARTMENT_OTHER): Payer: 59

## 2018-06-20 DIAGNOSIS — R0602 Shortness of breath: Secondary | ICD-10-CM | POA: Diagnosis not present

## 2018-06-20 DIAGNOSIS — E7800 Pure hypercholesterolemia, unspecified: Secondary | ICD-10-CM | POA: Diagnosis not present

## 2018-06-20 DIAGNOSIS — I340 Nonrheumatic mitral (valve) insufficiency: Secondary | ICD-10-CM

## 2018-06-20 DIAGNOSIS — I110 Hypertensive heart disease with heart failure: Secondary | ICD-10-CM | POA: Diagnosis not present

## 2018-06-20 DIAGNOSIS — I5033 Acute on chronic diastolic (congestive) heart failure: Secondary | ICD-10-CM | POA: Diagnosis not present

## 2018-06-20 DIAGNOSIS — I429 Cardiomyopathy, unspecified: Secondary | ICD-10-CM | POA: Diagnosis not present

## 2018-06-20 DIAGNOSIS — I5043 Acute on chronic combined systolic (congestive) and diastolic (congestive) heart failure: Secondary | ICD-10-CM | POA: Diagnosis not present

## 2018-06-20 HISTORY — PX: TEE WITHOUT CARDIOVERSION: SHX5443

## 2018-06-20 LAB — BASIC METABOLIC PANEL
ANION GAP: 9 (ref 5–15)
BUN: 22 mg/dL — ABNORMAL HIGH (ref 6–20)
CALCIUM: 9.4 mg/dL (ref 8.9–10.3)
CO2: 26 mmol/L (ref 22–32)
Chloride: 102 mmol/L (ref 98–111)
Creatinine, Ser: 0.98 mg/dL (ref 0.44–1.00)
GLUCOSE: 126 mg/dL — AB (ref 70–99)
POTASSIUM: 4.1 mmol/L (ref 3.5–5.1)
SODIUM: 137 mmol/L (ref 135–145)

## 2018-06-20 LAB — CBC
HCT: 42.1 % (ref 36.0–46.0)
Hemoglobin: 13.8 g/dL (ref 12.0–15.0)
MCH: 29 pg (ref 26.0–34.0)
MCHC: 32.8 g/dL (ref 30.0–36.0)
MCV: 88.4 fL (ref 80.0–100.0)
PLATELETS: 279 10*3/uL (ref 150–400)
RBC: 4.76 MIL/uL (ref 3.87–5.11)
RDW: 13.6 % (ref 11.5–15.5)
WBC: 8.2 10*3/uL (ref 4.0–10.5)
nRBC: 0 % (ref 0.0–0.2)

## 2018-06-20 LAB — GLUCOSE, CAPILLARY
GLUCOSE-CAPILLARY: 157 mg/dL — AB (ref 70–99)
GLUCOSE-CAPILLARY: 117 mg/dL — AB (ref 70–99)
GLUCOSE-CAPILLARY: 109 mg/dL — AB (ref 70–99)
Glucose-Capillary: 148 mg/dL — ABNORMAL HIGH (ref 70–99)

## 2018-06-20 LAB — MAGNESIUM: MAGNESIUM: 2.3 mg/dL (ref 1.7–2.4)

## 2018-06-20 SURGERY — ECHOCARDIOGRAM, TRANSESOPHAGEAL
Anesthesia: Moderate Sedation

## 2018-06-20 MED ORDER — MIDAZOLAM HCL (PF) 10 MG/2ML IJ SOLN
INTRAMUSCULAR | Status: DC | PRN
  Administered 2018-06-20: 2 mg via INTRAVENOUS
  Administered 2018-06-20: 1 mg via INTRAVENOUS

## 2018-06-20 MED ORDER — LORAZEPAM 1 MG PO TABS: 1.0000 mg | ORAL_TABLET | Freq: Once | ORAL | Status: DC

## 2018-06-20 MED ORDER — FENTANYL CITRATE (PF) 100 MCG/2ML IJ SOLN
INTRAMUSCULAR | Status: DC | PRN
  Administered 2018-06-20: 25 ug via INTRAVENOUS

## 2018-06-20 MED ORDER — MIDAZOLAM HCL (PF) 5 MG/ML IJ SOLN
INTRAMUSCULAR | Status: AC
  Filled 2018-06-20: qty 2

## 2018-06-20 MED ORDER — GADOBUTROL 1 MMOL/ML IV SOLN
7.5000 mL | Freq: Once | INTRAVENOUS | Status: AC | PRN
  Administered 2018-06-20: 7.5 mL via INTRAVENOUS

## 2018-06-20 MED ORDER — FENTANYL CITRATE (PF) 100 MCG/2ML IJ SOLN
INTRAMUSCULAR | Status: AC
  Filled 2018-06-20: qty 2

## 2018-06-20 NOTE — Progress Notes (Addendum)
Progress Note  Patient Name: Emily [name] Date of Encounter: 06/20/2018  Primary Cardiologist: Skeet Latch, MD   Subjective   Feeling anxious about TEE.  Otherwise no complaints.   Inpatient Medications    Scheduled Meds: . [MAR Hold] aspirin EC  81 mg Oral Daily  . [MAR Hold] atorvastatin  20 mg Oral q1800  . [MAR Hold] cholecalciferol  1,000 Units Oral Daily  . [MAR Hold] famotidine  20 mg Oral BID  . [MAR Hold] furosemide  40 mg Oral Daily  . [MAR Hold] heparin  5,000 Units Subcutaneous Q8H  . [MAR Hold] insulin aspart  0-15 Units Subcutaneous TID WC  . [MAR Hold] lisinopril  2.5 mg Oral Daily  . [MAR Hold] multivitamin with minerals  1 tablet Oral Daily  . [MAR Hold] propafenone  150 mg Oral Q8H  . [MAR Hold] sodium chloride flush  3 mL Intravenous Q12H  . [MAR Hold] thiamine  100 mg Oral Daily   Continuous Infusions: . [MAR Hold] sodium chloride    . sodium chloride     PRN Meds: [MAR Hold] sodium chloride, [MAR Hold] acetaminophen, [MAR Hold] diazepam, [MAR Hold] ipratropium-albuterol, [MAR Hold] nitroGLYCERIN, [MAR Hold] ondansetron (ZOFRAN) IV, [MAR Hold] ondansetron **OR** [DISCONTINUED] ondansetron (ZOFRAN) IV, [MAR Hold] sodium chloride flush   Vital Signs    Vitals:   06/19/18 1954 06/20/18 0034 06/20/18 0641 06/20/18 0929  BP: 112/70 111/74 94/63 118/77  Pulse: 83 86 99 (!) 103  Resp: '18 18 18 ' (!) 22  Temp: 98.1 F (36.7 C) 97.7 F (36.5 C) 98.2 F (36.8 C) 98 F (36.7 C)  TempSrc: Oral Oral Oral Oral  SpO2: 97% 100% 96% 98%  Weight:  74.8 kg    Height:        Intake/Output Summary (Last 24 hours) at 06/20/2018 0934 Last data filed at 06/20/2018 0031 Gross per 24 hour  Intake 501.72 ml  Output 700 ml  Net -198.28 ml   Filed Weights   06/19/18 0412 06/19/18 1408 06/20/18 0034  Weight: 76.6 kg 74.7 kg 74.8 kg    Telemetry    Sinus rhythm.  PVCs - Personally Reviewed  ECG    06/19/18: Sinus rhythm.  Rate 90 bpm.  PVCs.  QTC 508  ms - Personally Reviewed  Physical Exam   VS:  BP 118/77   Pulse (!) 103   Temp 98 F (36.7 C) (Oral)   Resp (!) 22   Ht 4' 11.5" (1.511 m)   Wt 74.8 kg   LMP 10/14/2014   SpO2 98%   BMI 32.73 kg/m   , BMI Body mass index is 32.73 kg/m. GENERAL:  Well appearing.  Mildly anxious. HEENT: Pupils equal round and reactive, fundi not visualized, oral mucosa unremarkable NECK:  No jugular venous distention, waveform within normal limits, carotid upstroke brisk and symmetric, no bruits LUNGS:  Clear to auscultation bilaterally HEART:  RRR.  PMI not displaced or sustained,S1 and S2 within normal limits, no S3, no S4, no clicks, no rubs, II/VI systolic murmur at the apex ABD:  Flat, positive bowel sounds normal in frequency in pitch, no bruits, no rebound, no guarding, no midline pulsatile mass, no hepatomegaly, no splenomegaly EXT:  2 plus pulses throughout, no edema, no cyanosis no clubbing SKIN:  No rashes no nodules NEURO:  Cranial nerves II through XII grossly intact, motor grossly intact throughout PSYCH:  Cognitively intact, oriented to person place and time  [employer]  06/18/18 0457 06/19/18 0519 06/19/18 1418 06/20/18 0314  NA 137 138  --  137  K 4.1 4.3  --  4.1  CL 101 101  --  102  CO2 25 29  --  26  GLUCOSE 132* 137*  --  126*  BUN 35* 33*  --  22*  CREATININE 0.93 0.92 0.83 0.98  CALCIUM 9.3 9.3  --  9.4  GFRNONAA >60 >60 >60 NOT CALCULATED  GFRAA >60 >60 >60 NOT CALCULATED  ANIONGAP 11 8  --  9     Hematology Recent Labs  Lab 06/16/18 1330 06/19/18 1418 06/20/18 0314  WBC 7.5 5.7 8.2  RBC 4.64 4.64 4.76  HGB 13.8 13.6 13.8  HCT 42.7 40.9 42.1  MCV 92.0 88.1 88.4  MCH 29.7 29.3 29.0  MCHC 32.3 33.3 32.8  RDW 14.1 13.7 13.6  PLT 278 260 279    Cardiac Enzymes Recent Labs  Lab 06/16/18 1953  TROPONINI <0.03    Recent Labs  Lab 06/16/18 1351  TROPIPOC 0.04     BNP Recent Labs  Lab 06/16/18 1330  BNP 417.2*       DDimer No results for input(s): DDIMER in the last 168 hours.   Radiology    No results found.  Cardiac Studies   LHC/RHC 06/19/18:  There is severe left ventricular systolic dysfunction.  LV end diastolic pressure is mildly elevated.  The left ventricular ejection fraction is less than 25% by visual estimate.  There is moderate (3+) mitral regurgitation.   Dilated severe nonischemic cardiomyopathy with an ejection fraction at approximately 20%.  Mild to moderate right heart pressure elevation.  Mean PA pressure 31 mm  Normal coronary arteries.  RECOMMENDATION: The patient will stay at [facility] following her catheterization for potential plans for TEE as per Dr. Oval Linsey and for optimal medical therapy of her nonischemic cardiomyopathy.  [place] 06/09/18: Right Heart Pressures RA: A-wave 10, V wave 6, mean 6 RV: 41/8 PA: 42/20; mean 31 PW: A-wave 26, V wave 29; mean 25 Initial AO 96/58 Initial LV: 91/21  Pullback: LV: 93/26 AO: 94/53  Oxygen saturation in the pulmonary artery 61% and in the aorta 95%.  Cardiac output by the Fick method 3.6 by the thermodilution method 3.1 L/min. Cardiac index by the Fick method 2.1 and by the thermodilution method 1.8 L/min/m.  SVR: 18.9 PVR: 1.7 WU     Patient Profile     57 y.o. female with diabetes, hyperlipidemia, family history of premature CAD, and frequent PVCs here with acute systolic and diastolic heart failure.   Assessment & Plan    # Acute systolic and diastolic heart failure: #Moderate to severe mitral regurgitation: LVEF newly reduced to 25 to 30% this admission.  She is net -3.8 L since admission.  Her volume status is stable on oral Lasix and renal function is stable.  Right atrial pressure was 6 on right heart catheterization yesterday, so her volume status appears to be stable.  The cause of her cardiomyopathy is unclear.  She denies any recent upper respiratory or viral illnesses.  Left heart  catheterization yesterday revealed normal coronary arteries.  She does have moderate mitral regurgitation, though it seems unlikely that this is the cause of her works and systolic dysfunction.  The regurgitation is not severe on my review of the echo.  By Sharlyn Bologna it was only mild, though visually it does appear more significant and it was moderate by cath.  She is going for TEE today  to better evaluate.  She also does not have significant left atrial enlargement, arguing against chronic mitral regurgitation.  Her MR was only mild on her echo 05/2015.  It is also possible that propafenone could be causing her heart failure.  This medication is contraindicated in heart failure so we will stop it at this time.  We will get a cardiac MRI today.  ESR and TSH were both within normal limits.  She has been under a lot of stress lately.  Her echo shows global dysfunction which is unlikely to represent Takotsubo cardiomyopathy.  We will also plan to set her up to be evaluated by the heart failure team as an outpatient.  Continue lisinopril.  She has not tolerated beta-blockers in the past and she remains hypotensive.  # PVCs:  Patient continues to have PVCs but is not bothered by them.  Stopping propafenone as above.  For questions or updates, please contact [place] Please consult [email or web address] for contact info under        Signed, Skeet Latch, MD  06/20/2018, 9:34 AM

## 2018-06-20 NOTE — Interval H&P Note (Signed)
History and Physical Interval Note:  06/20/2018 10:13 AM  Emily [name]  has presented today for surgery, with the diagnosis of SEVERE MR  The various methods of treatment have been discussed with the patient and family. After consideration of risks, benefits and other options for treatment, the patient has consented to  Procedure(s): TRANSESOPHAGEAL ECHOCARDIOGRAM (TEE) (N/A) as a surgical intervention .  [name] history has been reviewed, patient examined, no change in status, stable for surgery.  I have reviewed the patient's chart and labs.  Questions were answered to the patient's satisfaction.     Sigrid Schwebach [employer]

## 2018-06-20 NOTE — Progress Notes (Signed)
  Echocardiogram Echocardiogram Transesophageal has been performed.  Emily [name] 06/20/2018, 10:44 AM

## 2018-06-20 NOTE — CV Procedure (Signed)
Procedure: TEE  Sedation: Versed 3 mg IV, Fentanyl 25 mcg IV  Indication: Mitral regurgitation  Findings: Please see echo section for full report.   The left ventricle was mildly dilated.  There was severe global hypokinesis, EF 25-30%.  Normal wall thickness.  Normal right ventricular size with mildly decreased systolic function.  Moderate left atrial enlargement with no LA appendage thrombus.  Normal right atrium. There appeared to be a small PFO, a few bubbles crossed with Valsalva on bubble study.  Mild tricuspid regurgitation.  Trivial PI.  Trileaflet aortic valve with no stenosis or regurgitation.  The mitral valve was normal in thickness with no flail or prolapse.  There was moderate to severe MR, moderate by PISA (ERO 0.25 cm^2) but looked more severe visually.  There was flattening but not reversal of the pulmonary vein systolic doppler pattern. MR was central.  Normal caliber thoracic aorta with mild plaque.   Impression: Moderate-severe MR, likely functional.  Would treat cardiomyopathy aggressively to see if this improves.   [name] 06/20/2018 10:34 AM

## 2018-06-20 NOTE — H&P (View-Only) (Signed)
Progress Note  Patient Name: Emily [name] Date of Encounter: 06/20/2018  Primary Cardiologist: Skeet Latch, MD   Subjective   Feeling anxious about TEE.  Otherwise no complaints.   Inpatient Medications    Scheduled Meds: . [MAR Hold] aspirin EC  81 mg Oral Daily  . [MAR Hold] atorvastatin  20 mg Oral q1800  . [MAR Hold] cholecalciferol  1,000 Units Oral Daily  . [MAR Hold] famotidine  20 mg Oral BID  . [MAR Hold] furosemide  40 mg Oral Daily  . [MAR Hold] heparin  5,000 Units Subcutaneous Q8H  . [MAR Hold] insulin aspart  0-15 Units Subcutaneous TID WC  . [MAR Hold] lisinopril  2.5 mg Oral Daily  . [MAR Hold] multivitamin with minerals  1 tablet Oral Daily  . [MAR Hold] propafenone  150 mg Oral Q8H  . [MAR Hold] sodium chloride flush  3 mL Intravenous Q12H  . [MAR Hold] thiamine  100 mg Oral Daily   Continuous Infusions: . [MAR Hold] sodium chloride    . sodium chloride     PRN Meds: [MAR Hold] sodium chloride, [MAR Hold] acetaminophen, [MAR Hold] diazepam, [MAR Hold] ipratropium-albuterol, [MAR Hold] nitroGLYCERIN, [MAR Hold] ondansetron (ZOFRAN) IV, [MAR Hold] ondansetron **OR** [DISCONTINUED] ondansetron (ZOFRAN) IV, [MAR Hold] sodium chloride flush   Vital Signs    Vitals:   06/19/18 1954 06/20/18 0034 06/20/18 0641 06/20/18 0929  BP: 112/70 111/74 94/63 118/77  Pulse: 83 86 99 (!) 103  Resp: '18 18 18 ' (!) 22  Temp: 98.1 F (36.7 C) 97.7 F (36.5 C) 98.2 F (36.8 C) 98 F (36.7 C)  TempSrc: Oral Oral Oral Oral  SpO2: 97% 100% 96% 98%  Weight:  74.8 kg    Height:        Intake/Output Summary (Last 24 hours) at 06/20/2018 0934 Last data filed at 06/20/2018 0031 Gross per 24 hour  Intake 501.72 ml  Output 700 ml  Net -198.28 ml   Filed Weights   06/19/18 0412 06/19/18 1408 06/20/18 0034  Weight: 76.6 kg 74.7 kg 74.8 kg    Telemetry    Sinus rhythm.  PVCs - Personally Reviewed  ECG    06/19/18: Sinus rhythm.  Rate 90 bpm.  PVCs.  QTC 508  ms - Personally Reviewed  Physical Exam   VS:  BP 118/77   Pulse (!) 103   Temp 98 F (36.7 C) (Oral)   Resp (!) 22   Ht 4' 11.5" (1.511 m)   Wt 74.8 kg   LMP 10/14/2014   SpO2 98%   BMI 32.73 kg/m   , BMI Body mass index is 32.73 kg/m. GENERAL:  Well appearing.  Mildly anxious. HEENT: Pupils equal round and reactive, fundi not visualized, oral mucosa unremarkable NECK:  No jugular venous distention, waveform within normal limits, carotid upstroke brisk and symmetric, no bruits LUNGS:  Clear to auscultation bilaterally HEART:  RRR.  PMI not displaced or sustained,S1 and S2 within normal limits, no S3, no S4, no clicks, no rubs, II/VI systolic murmur at the apex ABD:  Flat, positive bowel sounds normal in frequency in pitch, no bruits, no rebound, no guarding, no midline pulsatile mass, no hepatomegaly, no splenomegaly EXT:  2 plus pulses throughout, no edema, no cyanosis no clubbing SKIN:  No rashes no nodules NEURO:  Cranial nerves II through XII grossly intact, motor grossly intact throughout PSYCH:  Cognitively intact, oriented to person place and time  [employer]  06/18/18 0457 06/19/18 0519 06/19/18 1418 06/20/18 0314  NA 137 138  --  137  K 4.1 4.3  --  4.1  CL 101 101  --  102  CO2 25 29  --  26  GLUCOSE 132* 137*  --  126*  BUN 35* 33*  --  22*  CREATININE 0.93 0.92 0.83 0.98  CALCIUM 9.3 9.3  --  9.4  GFRNONAA >60 >60 >60 NOT CALCULATED  GFRAA >60 >60 >60 NOT CALCULATED  ANIONGAP 11 8  --  9     Hematology Recent Labs  Lab 06/16/18 1330 06/19/18 1418 06/20/18 0314  WBC 7.5 5.7 8.2  RBC 4.64 4.64 4.76  HGB 13.8 13.6 13.8  HCT 42.7 40.9 42.1  MCV 92.0 88.1 88.4  MCH 29.7 29.3 29.0  MCHC 32.3 33.3 32.8  RDW 14.1 13.7 13.6  PLT 278 260 279    Cardiac Enzymes Recent Labs  Lab 06/16/18 1953  TROPONINI <0.03    Recent Labs  Lab 06/16/18 1351  TROPIPOC 0.04     BNP Recent Labs  Lab 06/16/18 1330  BNP 417.2*       DDimer No results for input(s): DDIMER in the last 168 hours.   Radiology    No results found.  Cardiac Studies   LHC/RHC 06/19/18:  There is severe left ventricular systolic dysfunction.  LV end diastolic pressure is mildly elevated.  The left ventricular ejection fraction is less than 25% by visual estimate.  There is moderate (3+) mitral regurgitation.   Dilated severe nonischemic cardiomyopathy with an ejection fraction at approximately 20%.  Mild to moderate right heart pressure elevation.  Mean PA pressure 31 mm  Normal coronary arteries.  RECOMMENDATION: The patient will stay at [facility] following her catheterization for potential plans for TEE as per Dr. Oval Linsey and for optimal medical therapy of her nonischemic cardiomyopathy.  [place] 06/09/18: Right Heart Pressures RA: A-wave 10, V wave 6, mean 6 RV: 41/8 PA: 42/20; mean 31 PW: A-wave 26, V wave 29; mean 25 Initial AO 96/58 Initial LV: 91/21  Pullback: LV: 93/26 AO: 94/53  Oxygen saturation in the pulmonary artery 61% and in the aorta 95%.  Cardiac output by the Fick method 3.6 by the thermodilution method 3.1 L/min. Cardiac index by the Fick method 2.1 and by the thermodilution method 1.8 L/min/m.  SVR: 18.9 PVR: 1.7 WU     Patient Profile     57 y.o. female with diabetes, hyperlipidemia, family history of premature CAD, and frequent PVCs here with acute systolic and diastolic heart failure.   Assessment & Plan    # Acute systolic and diastolic heart failure: #Moderate to severe mitral regurgitation: LVEF newly reduced to 25 to 30% this admission.  She is net -3.8 L since admission.  Her volume status is stable on oral Lasix and renal function is stable.  Right atrial pressure was 6 on right heart catheterization yesterday, so her volume status appears to be stable.  The cause of her cardiomyopathy is unclear.  She denies any recent upper respiratory or viral illnesses.  Left heart  catheterization yesterday revealed normal coronary arteries.  She does have moderate mitral regurgitation, though it seems unlikely that this is the cause of her works and systolic dysfunction.  The regurgitation is not severe on my review of the echo.  By Sharlyn Bologna it was only mild, though visually it does appear more significant and it was moderate by cath.  She is going for TEE today  to better evaluate.  She also does not have significant left atrial enlargement, arguing against chronic mitral regurgitation.  Her MR was only mild on her echo 05/2015.  It is also possible that propafenone could be causing her heart failure.  This medication is contraindicated in heart failure so we will stop it at this time.  We will get a cardiac MRI today.  ESR and TSH were both within normal limits.  She has been under a lot of stress lately.  Her echo shows global dysfunction which is unlikely to represent Takotsubo cardiomyopathy.  We will also plan to set her up to be evaluated by the heart failure team as an outpatient.  Continue lisinopril.  She has not tolerated beta-blockers in the past and she remains hypotensive.  # PVCs:  Patient continues to have PVCs but is not bothered by them.  Stopping propafenone as above.  For questions or updates, please contact [place] Please consult [email or web address] for contact info under        Signed, Skeet Latch, MD  06/20/2018, 9:34 AM

## 2018-06-21 ENCOUNTER — Encounter (HOSPITAL_COMMUNITY): Payer: Self-pay | Admitting: Cardiology

## 2018-06-21 DIAGNOSIS — I5041 Acute combined systolic (congestive) and diastolic (congestive) heart failure: Secondary | ICD-10-CM | POA: Diagnosis not present

## 2018-06-21 DIAGNOSIS — Z9889 Other specified postprocedural states: Secondary | ICD-10-CM

## 2018-06-21 DIAGNOSIS — I472 Ventricular tachycardia: Secondary | ICD-10-CM | POA: Diagnosis not present

## 2018-06-21 DIAGNOSIS — I340 Nonrheumatic mitral (valve) insufficiency: Secondary | ICD-10-CM | POA: Diagnosis not present

## 2018-06-21 DIAGNOSIS — I428 Other cardiomyopathies: Secondary | ICD-10-CM | POA: Diagnosis not present

## 2018-06-21 HISTORY — DX: Other specified postprocedural states: Z98.890

## 2018-06-21 HISTORY — DX: Other cardiomyopathies: I42.8

## 2018-06-21 LAB — BASIC METABOLIC PANEL
ANION GAP: 8 (ref 5–15)
BUN: 21 mg/dL — ABNORMAL HIGH (ref 6–20)
CALCIUM: 9 mg/dL (ref 8.9–10.3)
CO2: 26 mmol/L (ref 22–32)
CREATININE: 0.9 mg/dL (ref 0.44–1.00)
Chloride: 103 mmol/L (ref 98–111)
GFR calc Af Amer: >60 mL/min (ref >60)
GFR calc non Af Amer: >60 mL/min (ref >60)
GLUCOSE: 143 mg/dL — AB (ref 70–99)
Potassium: 3.8 mmol/L (ref 3.5–5.1)
Sodium: 137 mmol/L (ref 135–145)

## 2018-06-21 LAB — GLUCOSE, CAPILLARY
GLUCOSE-CAPILLARY: 177 mg/dL — AB (ref 70–99)
Glucose-Capillary: 137 mg/dL — ABNORMAL HIGH (ref 70–99)

## 2018-06-21 MED ORDER — ATORVASTATIN CALCIUM 20 MG PO TABS: 20.0000 mg | ORAL_TABLET | Freq: Every day | ORAL | 6 refills | Status: DC

## 2018-06-21 MED ORDER — FUROSEMIDE 40 MG PO TABS: 40.0000 mg | ORAL_TABLET | Freq: Every day | ORAL | 6 refills | Status: DC

## 2018-06-21 MED ORDER — METFORMIN HCL 850 MG PO TABS: ORAL_TABLET | ORAL | 1 refills | Status: DC

## 2018-06-21 MED ORDER — FAMOTIDINE 20 MG PO TABS: 20.0000 mg | ORAL_TABLET | Freq: Every day | ORAL | 6 refills | Status: DC

## 2018-06-21 MED ORDER — LISINOPRIL 2.5 MG PO TABS: 2.5000 mg | ORAL_TABLET | Freq: Every day | ORAL | 6 refills | Status: DC

## 2018-06-21 MED ORDER — METOPROLOL TARTRATE 12.5 MG HALF TABLET: 12.5000 mg | ORAL_TABLET | Freq: Two times a day (BID) | ORAL | Status: DC

## 2018-06-21 MED ORDER — METOPROLOL TARTRATE 25 MG PO TABS: 12.5000 mg | ORAL_TABLET | Freq: Two times a day (BID) | ORAL | 6 refills | Status: DC

## 2018-06-21 NOTE — Progress Notes (Signed)
Patient ready for discharge to home with husband to accompany. All personal belongings with pt. Discharge information, meds and follow up reviewed with pt.   Patient demonstrates no c/o discomfort.

## 2018-06-21 NOTE — Progress Notes (Signed)
Progress Note  Patient Name: Emily [name] Date of Encounter: 06/21/2018  Primary Cardiologist: Skeet Latch, MD   Subjective   Feels great, no chest pain or SOB wants to go home.    Inpatient Medications    Scheduled Meds: . aspirin EC  81 mg Oral Daily  . atorvastatin  20 mg Oral q1800  . cholecalciferol  1,000 Units Oral Daily  . famotidine  20 mg Oral BID  . furosemide  40 mg Oral Daily  . heparin  5,000 Units Subcutaneous Q8H  . insulin aspart  0-15 Units Subcutaneous TID WC  . lisinopril  2.5 mg Oral Daily  . LORazepam  1 mg Oral Once  . multivitamin with minerals  1 tablet Oral Daily  . sodium chloride flush  3 mL Intravenous Q12H  . thiamine  100 mg Oral Daily   Continuous Infusions: . sodium chloride     PRN Meds: sodium chloride, acetaminophen, diazepam, ipratropium-albuterol, nitroGLYCERIN, ondansetron (ZOFRAN) IV, ondansetron **OR** [DISCONTINUED] ondansetron (ZOFRAN) IV, sodium chloride flush   Vital Signs    Vitals:   06/20/18 2348 06/21/18 0411 06/21/18 0550 06/21/18 0804  BP: (!) 90/55 (!) 82/54  104/64  Pulse: 93 90  88  Resp: 12 15    Temp: 97.9 F (36.6 C) 98.3 F (36.8 C)  98 F (36.7 C)  TempSrc: Oral Oral  Oral  SpO2: 98% 99%  99%  Weight:   74.2 kg   Height:   4\' 11"  (1.499 m)     Intake/Output Summary (Last 24 hours) at 06/21/2018 1044 Last data filed at 06/21/2018 1000 Gross per 24 hour  Intake 393 ml  Output 2100 ml  Net -1707 ml   Filed Weights   06/19/18 1408 06/20/18 0034 06/21/18 0550  Weight: 74.7 kg 74.8 kg 74.2 kg    Telemetry    SR with PVCs  - Personally Reviewed  ECG    No new - Personally Reviewed  Physical Exam   GEN: No acute distress.   Neck: No JVD sitting up right Cardiac: RRR, no murmurs, rubs, or gallops.  Respiratory: Clear to auscultation bilaterally. GI: Soft, nontender, non-distended  MS: No edema; No deformity. Neuro:  Nonfocal  Psych: Normal affect   Labs    Chemistry Recent  Labs  Lab 06/19/18 0519 06/19/18 1418 06/20/18 0314 06/21/18 0503  NA 138  --  137 137  K 4.3  --  4.1 3.8  CL 101  --  102 103  CO2 29  --  26 26  GLUCOSE 137*  --  126* 143*  BUN 33*  --  22* 21*  CREATININE 0.92 0.83 0.98 0.90  CALCIUM 9.3  --  9.4 9.0  GFRNONAA >60 >60 NOT CALCULATED >60  GFRAA >60 >60 NOT CALCULATED >60  ANIONGAP 8  --  9 8     Hematology Recent Labs  Lab 06/16/18 1330 06/19/18 1418 06/20/18 0314  WBC 7.5 5.7 8.2  RBC 4.64 4.64 4.76  HGB 13.8 13.6 13.8  HCT 42.7 40.9 42.1  MCV 92.0 88.1 88.4  MCH 29.7 29.3 29.0  MCHC 32.3 33.3 32.8  RDW 14.1 13.7 13.6  PLT 278 260 279    Cardiac Enzymes Recent Labs  Lab 06/16/18 1953  TROPONINI <0.03    Recent Labs  Lab 06/16/18 1351  TROPIPOC 0.04     BNP Recent Labs  Lab 06/16/18 1330  BNP 417.2*     DDimer No results for input(s): DDIMER in the last  168 hours.   Radiology    Mr Cardiac Morphology W Wo Contrast  Result Date: 06/20/2018 CLINICAL DATA:  Cardiomyopathy of uncertain etiology. EXAM: CARDIAC MRI TECHNIQUE: The patient was scanned on a 1.5 Tesla GE magnet. A dedicated cardiac coil was used. Functional imaging was done using Fiesta sequences. 2,3, and 4 chamber views were done to assess for RWMA's. Modified Simpson's rule using a short axis stack was used to calculate an ejection fraction on a dedicated work Conservation officer, nature. The patient received 7.5 cc of Gadavist. After 10 minutes inversion recovery sequences were used to assess for infiltration and scar tissue. CONTRAST:  7.5 cc Gadavist FINDINGS: Limited images of the lung fields showed no gross abnormalities. Trivial pericardial effusion.  Prominent epicardial fat. Mildly dilated left ventricle with normal wall thickness. LV morphology did not suggest noncompaction. Diffuse LV hypokinesis with EF 28%. Normal right ventricular size with mildly decreased systolic function, EF 01%. Moderate left atrial enlargement. Normal  right atrium. Trileaflet aortic valve with no regurgitation or stenosis. There was at least moderate mitral regurgitation present. Flow sequences to quantify MR were not done. Delayed enhancement images were low quality. However, no definite myocardial late gadolinium enhancement (LGE) was detected. Measurements: LVEDV 197 mL LVSV 55 mL LVEF 28% RVEDV 90 mL RVSV 35 mL RVEF 38% IMPRESSION: 1.  Mildly dilated LV with EF 28%, diffuse hypokinesis. 2.  Normal RV size with mildly decreased systolic function, EF 02%. 3. No myocardial LGE, so no definitive evidence for prior myocarditis, infiltrative disease, or MI. 4. At least moderate mitral regurgitation. This was evaluated by TEE today. Dalton Mclean Electronically Signed   By: [name] M.D.   On: 06/20/2018 14:27    Cardiac Studies   LHC/RHC 06/19/18:  There is severe left ventricular systolic dysfunction.  LV end diastolic pressure is mildly elevated.  The left ventricular ejection fraction is less than 25% by visual estimate.  There is moderate (3+) mitral regurgitation.  Dilated severe nonischemic cardiomyopathy with an ejection fraction at approximately 20%.  Mild to moderate right heart pressure elevation. Mean PA pressure 31 mm  Normal coronary arteries.  RECOMMENDATION: The patient will stay at [facility] following her catheterization for potential plans for TEE as per Dr. Oval Linsey and for optimal medical therapy of her nonischemic cardiomyopathy.  [place] 06/09/18: Right Heart Pressures RA: A-wave 10, V wave 6, mean 6 RV: 41/8 PA: 42/20; mean 31 PW: A-wave 26, V wave 29; mean 25 Initial AO 96/58 Initial LV: 91/21  Pullback: LV: 93/26 AO: 94/53  Oxygen saturation in the pulmonary artery 61% and in the aorta 95%.  Cardiac output by the Fick method 3.6 by the thermodilution method 3.1 L/min. Cardiac index by the Fick method 2.1 and by the thermodilution method 1.8 L/min/m.  SVR: 18.9 PVR: 1.7 WU   Procedure:  TEE  Sedation: Versed 3 mg IV, Fentanyl 25 mcg IV  Indication: Mitral regurgitation  Findings: Please see echo section for full report.   The left ventricle was mildly dilated.  There was severe global hypokinesis, EF 25-30%.  Normal wall thickness.  Normal right ventricular size with mildly decreased systolic function.  Moderate left atrial enlargement with no LA appendage thrombus.  Normal right atrium. There appeared to be a small PFO, a few bubbles crossed with Valsalva on bubble study.  Mild tricuspid regurgitation.  Trivial PI.  Trileaflet aortic valve with no stenosis or regurgitation.  The mitral valve was normal in thickness with no flail or  prolapse.  There was moderate to severe MR, moderate by PISA (ERO 0.25 cm^2) but looked more severe visually.  There was flattening but not reversal of the pulmonary vein systolic doppler pattern. MR was central.  Normal caliber thoracic aorta with mild plaque.   Impression: Moderate-severe MR, likely functional.  Would treat cardiomyopathy aggressively to see if this improves.    Patient Profile     57 y.o. female with diabetes, hyperlipidemia, family history of premature CAD, and frequent PVCs here with acute systolic and diastolic heart failure  Assessment & Plan    Acute systolic and diastolic HF and moderate to severe MR.   --Neg 5525 since admit and wt down from 76.6 Kg to 74.2 Kg  --now on lasix 40 mg daily. --cr at 0.90   --? Cause due to propafenone - stopped  --outpt HF appt.  --on lisnopril continue, did not tolerate BB in past.  With hypotension lisinopril held one day.   --Dr. Oval Linsey to eval. Possible discharge.   Hypotensive 82/54 in night now 104/64  NICM, normal coronary arteries on cath.  EF 20-28%   Moderate to severe MR, treating NICM may improve.  PVCs --propafenone stopped has PVCs but she is aware only occ.  She does not notice any difference between now and before hospitalization. ? PVCs cause of  cardiomyopathy   Diabetes - metformin on hold resume after 48 hours post MRI.   On SSI glucose 117 to 177.  Controlled   For questions or updates, please contact [place] Please consult [email or web address] for contact info under        Signed, Cecilie Kicks, NP  06/21/2018, 10:44 AM

## 2018-06-21 NOTE — Discharge Summary (Signed)
Discharge Summary    Patient ID: COURTLAND COPPA MRN: [id number]; DOB: 1961-03-16  Admit date: 06/16/2018 Discharge date: 06/21/2018  Primary Care Provider: [name], MD  Primary Cardiologist: Skeet Latch, MD  Primary Electrophysiologist:  None   Discharge Diagnoses    Principal Problem:   Acute combined systolic and diastolic heart failure ([place]) Active Problems:   NICM (nonischemic cardiomyopathy) ([place])   Elevated LDL cholesterol level   Type 2 diabetes mellitus ([place])   PVC's (premature ventricular contractions)   NSVT (nonsustained ventricular tachycardia) ([facility])   GERD (gastroesophageal reflux disease)   Obesity (BMI 30.0-34.9)   Nonrheumatic mitral valve regurgitation   S/P cardiac cath, 06/20/18, normal coronary arteries   Allergies Allergies  Allergen Reactions  . Hornet Venom Swelling  . Tetanus Toxoids Other (See Comments)    Big red rash and swelling in the muscle    Diagnostic Studies/Procedures    LHC/RHC 06/19/18:  There is severe left ventricular systolic dysfunction.  LV end diastolic pressure is mildly elevated.  The left ventricular ejection fraction is less than 25% by visual estimate.  There is moderate (3+) mitral regurgitation.  Dilated severe nonischemic cardiomyopathy with an ejection fraction at approximately 20%.  Mild to moderate right heart pressure elevation. Mean PA pressure 31 mm  Normal coronary arteries.  RECOMMENDATION: The patient will stay at [facility] following her catheterization for potential plans for TEE as per Dr. Oval Linsey and for optimal medical therapy of her nonischemic cardiomyopathy.  [place] 06/09/18: Right Heart Pressures RA: A-wave 10, V wave 6, mean 6 RV: 41/8 PA: 42/20; mean 31 PW: A-wave 26, V wave 29; mean 25 Initial AO 96/58 Initial LV: 91/21  Pullback: LV: 93/26 AO: 94/53  Oxygen saturation in the pulmonary artery 61% and in the aorta 95%.  Cardiac output by the Fick method 3.6 by  the thermodilution method 3.1 L/min. Cardiac index by the Fick method 2.1 and by the thermodilution method 1.8 L/min/m.  SVR: 18.9 PVR: 1.7 WU   Procedure: TEE  Sedation: Versed 3 mg IV, Fentanyl 25 mcg IV  Indication: Mitral regurgitation  Findings: Please see echo section for full report. The left ventricle was mildly dilated. There was severe global hypokinesis, EF 25-30%. Normal wall thickness. Normal right ventricular size with mildly decreased systolic function. Moderate left atrial enlargement with no LA appendage thrombus. Normal right atrium. There appeared to be a small PFO, a few bubbles crossed with Valsalva on bubble study. Mild tricuspid regurgitation. Trivial PI. Trileaflet aortic valve with no stenosis or regurgitation. The mitral valve was normal in thickness with no flail or prolapse. There was moderate to severe MR, moderate by PISA (ERO 0.25 cm^2) but looked more severe visually. There was flattening but not reversal of the pulmonary vein systolic doppler pattern. MR was central. Normal caliber thoracic aorta with mild plaque.   Impression: Moderate-severe MR, likely functional. Would treat cardiomyopathy aggressively to see if this improves.    _____________   History of Present Illness     26 YOF with DM-2, HLD, FH or premature CAD, chronic diastolic HF, PVCs (NSVT), -treated with propafenone and admitted by [facility] 06/16/18 for SOB, severe cough and chest discomfort-acute diastolic HF and Lasix was started and plans for Echo.    Hospital Course     Consultants: cardiology    Pt was seen by Cardiology for EF on Echo of 20-30% and moderate to severe MR.    Troponins were neg.  BNP was 417.  CXR with  mild pulmonary effusions.  Pt was seen by Dr. Oval Linsey and at that time HF improved.  With new drop in EF Lt and Rt cardiac cath were ordered.     She was found to have normal coronary arteries.   Right atrial pressure was 6 on right  heart catheterization, so her volume status appears to be stable.  Lasix was changed to po.   ESR and TSH were both within normal limits.  She has been under a lot of stress lately.  Her echo shows global dysfunction which is unlikely to represent Takotsubo cardiomyopathy.She underwent TEE see above and EF 25-30%, small PFO, mild TR  The mitral valve was normal in thickness with no flail or prolapse.  There was moderate to severe MR, moderate by PISA (ERO 0.25 cm^2) but looked more severe visually.  There was flattening but not reversal of the pulmonary vein systolic doppler pattern. MR was central.  Normal caliber thoracic aorta with mild plaque.  Plan to treat NICM aggressively to see if this improves.  Cardiac MRI was done and no evidence of late gadolinium enhancement or myocarditis.    Profenone was stopped (not recommended with HF) and pt was willing to try BB again, she was depressed previously.    She was seen by Dr. Oval Linsey today and found stable for discharge.  We will add BB and she will continue Lasix 40 daily.  Discharge wt is 74.2 Kg.  She will follow up in clinic and we will refer to HF clinic as well.   She will hold metformin for 48 hours after MRI with contrast.  Follow up 10 day toc   _____________  Discharge Vitals Blood pressure 103/67, pulse (!) 102, temperature 98 F (36.7 C), temperature source Oral, resp. rate 15, height 4' 11" (1.499 m), weight 74.2 kg, last menstrual period 10/14/2014, SpO2 99 %.  Filed Weights   06/19/18 1408 06/20/18 0034 06/21/18 0550  Weight: 74.7 kg 74.8 kg 74.2 kg    Labs & Radiologic Studies    CBC Recent Labs    06/19/18 1418 06/20/18 0314  WBC 5.7 8.2  HGB 13.6 13.8  HCT 40.9 42.1  MCV 88.1 88.4  PLT 260 160   Basic Metabolic Panel Recent Labs    06/19/18 0519  06/20/18 0314 06/21/18 0503  NA 138  --  137 137  K 4.3  --  4.1 3.8  CL 101  --  102 103  CO2 29  --  26 26  GLUCOSE 137*  --  126* 143*  BUN 33*  --  22* 21*    CREATININE 0.92   < > 0.98 0.90  CALCIUM 9.3  --  9.4 9.0  MG 2.2  --  2.3  --    < > = values in this interval not displayed.   Liver Function Tests No results for input(s): AST, ALT, ALKPHOS, BILITOT, PROT, ALBUMIN in the last 72 hours. No results for input(s): LIPASE, AMYLASE in the last 72 hours. Cardiac Enzymes No results for input(s): CKTOTAL, CKMB, CKMBINDEX, TROPONINI in the last 72 hours. BNP Invalid input(s): POCBNP D-Dimer No results for input(s): DDIMER in the last 72 hours. Hemoglobin A1C No results for input(s): HGBA1C in the last 72 hours. Fasting Lipid Panel No results for input(s): CHOL, HDL, LDLCALC, TRIG, CHOLHDL, LDLDIRECT in the last 72 hours. Thyroid Function Tests Recent Labs    06/19/18 1418  TSH 1.649   _____________  Dg Chest 2 View  Result Date: 06/16/2018  CLINICAL DATA:  Pt reports being sent here from UC with fluid on her lungs. Pt reports she has been SHOB, fatigue, and cough for the last 3 weeks. Pt has hx of CHF, Type 2 Diabetes. Nonsmoker. EXAM: CHEST - 2 VIEW COMPARISON:  03/23/2015 FINDINGS: Increase in central pulmonary vascular congestion. Worsening perihilar and bibasilar interstitial edema or infiltrates with probable septal lines peripherally. New small bilateral pleural effusions. Heart size upper limits normal. No pneumothorax. Visualized bones unremarkable. IMPRESSION: Probable mild pulmonary interstitial edema and small effusions, new since previous Electronically Signed   By: Lucrezia Europe M.D.   On: 06/16/2018 14:36   Mr Cardiac Morphology W Wo Contrast  Result Date: 06/20/2018 CLINICAL DATA:  Cardiomyopathy of uncertain etiology. EXAM: CARDIAC MRI TECHNIQUE: The patient was scanned on a 1.5 Tesla GE magnet. A dedicated cardiac coil was used. Functional imaging was done using Fiesta sequences. 2,3, and 4 chamber views were done to assess for RWMA's. Modified Simpson's rule using a short axis stack was used to calculate an ejection fraction  on a dedicated work Conservation officer, nature. The patient received 7.5 cc of Gadavist. After 10 minutes inversion recovery sequences were used to assess for infiltration and scar tissue. CONTRAST:  7.5 cc Gadavist FINDINGS: Limited images of the lung fields showed no gross abnormalities. Trivial pericardial effusion.  Prominent epicardial fat. Mildly dilated left ventricle with normal wall thickness. LV morphology did not suggest noncompaction. Diffuse LV hypokinesis with EF 28%. Normal right ventricular size with mildly decreased systolic function, EF 15%. Moderate left atrial enlargement. Normal right atrium. Trileaflet aortic valve with no regurgitation or stenosis. There was at least moderate mitral regurgitation present. Flow sequences to quantify MR were not done. Delayed enhancement images were low quality. However, no definite myocardial late gadolinium enhancement (LGE) was detected. Measurements: LVEDV 197 mL LVSV 55 mL LVEF 28% RVEDV 90 mL RVSV 35 mL RVEF 38% IMPRESSION: 1.  Mildly dilated LV with EF 28%, diffuse hypokinesis. 2.  Normal RV size with mildly decreased systolic function, EF 83%. 3. No myocardial LGE, so no definitive evidence for prior myocarditis, infiltrative disease, or MI. 4. At least moderate mitral regurgitation. This was evaluated by TEE today. Dalton Mclean Electronically Signed   By: [name] M.D.   On: 06/20/2018 14:27   Disposition   Pt is being discharged home today in good condition.  Follow-up Plans & Appointments   Weigh daily first thing every morning and call office for wt increase of 3 pounds in a day or 5 pounds in a week. Low salt diet,  2000 mg daily or less.  Continue diabetic diet.    Hold Metformin until the 23rd of Nov and resume, holding due to interaction with dye with cath and MRI.  We added lopressor half a 25 mg tab twice a day.    We stopped Rythmol   Call the office for any questions.    Follow-up Information    Skeet Latch, MD Follow up on 07/05/2018.   Specialty:  Cardiology Why:  at 8:30 AM  with Kerin Ransom, PA for Dr. Molli Posey information: [address] [place] [address] [phone]        Larey Dresser, MD Follow up on 07/11/2018.   Specialty:  Cardiology Why:   at 11:30 AM with his PA/NP this is the heart failure clinic and they will send you a package of information.   Contact information: [place] [address]  [phone]            Discharge Medications   Allergies as of 06/21/2018      Reactions   Hornet Venom Swelling   Tetanus Toxoids Other (See Comments)   Big red rash and swelling in the muscle      Medication List    STOP taking these medications   propafenone 150 MG tablet Commonly known as:  RYTHMOL     TAKE these medications   acetaminophen 500 MG tablet Commonly known as:  TYLENOL Take 500 mg by mouth every 6 (six) hours as needed for moderate pain.   aspirin 81 MG tablet Take 81 mg by mouth daily.   atorvastatin 20 MG tablet Commonly known as:  LIPITOR Take 1 tablet (20 mg total) by mouth daily at 6 PM.   blood glucose meter kit and supplies Kit Dispense based on patient and insurance preference. Use up to four times daily as directed. (FOR ICD-9 250.00, 250.01).   cholecalciferol 1000 units tablet Commonly known as:  VITAMIN D Take 1,000 Units by mouth daily.   famotidine 20 MG tablet Commonly known as:  PEPCID Take 1 tablet (20 mg total) by mouth daily.   furosemide 40 MG tablet Commonly known as:  LASIX Take 1 tablet (40 mg total) by mouth daily. Start taking on:  06/22/2018   glucose blood test strip USE TO TEST UP TO 4 TIMES A DAY.   ibuprofen 200 MG tablet Commonly known as:  ADVIL,MOTRIN Take 200 mg by mouth every 6 (six) hours as needed for mild pain. Reported on 10/26/2015   lisinopril 2.5 MG tablet Commonly known as:  PRINIVIL,ZESTRIL Take 1 tablet (2.5 mg total) by mouth  daily. Start taking on:  06/22/2018   metFORMIN 850 MG tablet Commonly known as:  GLUCOPHAGE TAKE 1 TABLET BY MOUTH TWICE A DAY WITH A MEAL Start taking on:  06/23/2018 What changed:  These instructions start on 06/23/2018. If you are unsure what to do until then, ask your doctor or other care provider.   metoprolol tartrate 25 MG tablet Commonly known as:  LOPRESSOR Take 0.5 tablets (12.5 mg total) by mouth 2 (two) times daily.   multivitamin with minerals tablet Take 1 tablet by mouth daily.   nitroGLYCERIN 0.4 MG SL tablet Commonly known as:  NITROSTAT Place 1 tablet (0.4 mg total) under the tongue every 5 (five) minutes as needed for chest pain.   ONE TOUCH ULTRA 2 w/Device Kit USE TO TEST UP TO 4 TIMES PER DAY.   thiamine 100 MG tablet Commonly known as:  VITAMIN B-1 Take 100 mg by mouth daily.        Acute coronary syndrome (MI, NSTEMI, STEMI, etc) this admission?: No.    Outstanding Labs/Studies   BMP  Duration of Discharge Encounter   Greater than 30 minutes including physician time.  Signed, Cecilie Kicks, NP 06/21/2018, 1:22 PM

## 2018-06-21 NOTE — Discharge Instructions (Signed)
Weigh daily first thing every morning and call office for wt increase of 3 pounds in a day or 5 pounds in a week. Low salt diet,  2000 mg daily or less.  Continue diabetic diet.    Hold Metformin until the 23rd of Nov and resume, holding due to interaction with dye with cath and MRI.  We added lopressor half a 25 mg tab twice a day.    Call the office for any questions.

## 2018-07-04 ENCOUNTER — Encounter: Payer: Self-pay | Admitting: Cardiology

## 2018-07-04 DIAGNOSIS — IMO0001 Reserved for inherently not codable concepts without codable children: Secondary | ICD-10-CM | POA: Insufficient documentation

## 2018-07-04 DIAGNOSIS — Z0389 Encounter for observation for other suspected diseases and conditions ruled out: Secondary | ICD-10-CM

## 2018-07-05 ENCOUNTER — Encounter: Payer: Self-pay | Admitting: Cardiology

## 2018-07-05 ENCOUNTER — Ambulatory Visit (INDEPENDENT_AMBULATORY_CARE_PROVIDER_SITE_OTHER): Payer: 59 | Admitting: Cardiology

## 2018-07-05 DIAGNOSIS — I428 Other cardiomyopathies: Secondary | ICD-10-CM

## 2018-07-05 DIAGNOSIS — I340 Nonrheumatic mitral (valve) insufficiency: Secondary | ICD-10-CM | POA: Diagnosis not present

## 2018-07-05 DIAGNOSIS — I493 Ventricular premature depolarization: Secondary | ICD-10-CM

## 2018-07-05 DIAGNOSIS — E119 Type 2 diabetes mellitus without complications: Secondary | ICD-10-CM | POA: Diagnosis not present

## 2018-07-05 DIAGNOSIS — I5041 Acute combined systolic (congestive) and diastolic (congestive) heart failure: Secondary | ICD-10-CM | POA: Diagnosis not present

## 2018-07-05 DIAGNOSIS — Z0389 Encounter for observation for other suspected diseases and conditions ruled out: Secondary | ICD-10-CM

## 2018-07-05 DIAGNOSIS — IMO0001 Reserved for inherently not codable concepts without codable children: Secondary | ICD-10-CM

## 2018-07-05 MED ORDER — METOPROLOL TARTRATE 25 MG PO TABS: 12.5000 mg | ORAL_TABLET | Freq: Two times a day (BID) | ORAL | 6 refills | Status: DC

## 2018-07-05 MED ORDER — SACUBITRIL-VALSARTAN 24-26 MG PO TABS: 1.0000 | ORAL_TABLET | Freq: Two times a day (BID) | ORAL | 6 refills | Status: DC

## 2018-07-05 NOTE — Assessment & Plan Note (Signed)
EF < 25%-NICM

## 2018-07-05 NOTE — Assessment & Plan Note (Signed)
2016 and at cath Nov 2019

## 2018-07-05 NOTE — Patient Instructions (Addendum)
Medication Instructions:    START TAKING ENTERSTO 24-26 TWICE A DAY   Please Honor Card patient is presenting for Emily [name]: [id number]; Emily [name]: [id number]; VYXAJ: [id number]; ISSUER: [id number] ID: Pharmacy to complete   If you need a refill on your cardiac medications before your next appointment, please call your pharmacy.   Lab work: NONE ORDERED  TODAY   If you have labs (blood work) drawn today and your tests are completely normal, you will receive your results only by: Marland Kitchen MyChart Message (if you have MyChart) OR . A paper copy in the mail If you have any lab test that is abnormal or we need to change your treatment, we will call you to review the results.  Testing/Procedures: NONE ORDERED  TODAY    Follow-Up: AS SCHEDULED WITH HEART FAILURE AND DR [facility]  Any Other Special Instructions Will Be Listed Below (If Applicable).

## 2018-07-05 NOTE — Assessment & Plan Note (Signed)
Propafenone stopped Nov 2019 and low dose beta blocker added (Lopressor caused depression in the past)

## 2018-07-05 NOTE — Assessment & Plan Note (Signed)
DC'd 06/21/18 after 9 lb diuresis

## 2018-07-05 NOTE — Progress Notes (Signed)
07/05/2018 Emily [name]   1961/04/10  [id number]  Primary Physician [name], MD Primary Cardiologist: Dr Oval Linsey  HPI: Emily [name] is a pleasant 57 year old female who was admitted in November with acute systolic heart failure.  Work-up revealed a non ischemic cardiomyopathy with an EF of 25% and global LV dysfunction.  She also had moderate to severe MR.  Cardiac MRI was negative for myocarditis and based on her global LV dysfunction it was not felt she had a Takotsubo event.  She diuresed 9 pounds.  She had been on Propafenone in the past for symptomatic PVCs and this was discontinued.  She was put on low-dose metoprolol, the patient does note that metoprolol had caused her depression to be exacerbated in the past.    She is in the office today for follow-up.  Since discharge she says that she still has significant dyspnea on exertion but that her shortness of breath at rest is improved.  She does have a persistent cough, occasionally productive.  She has been exposed to a viral URI at home with her grandchildren.  She denies any lower extremity edema.  Her blood pressures been borderline low but she has been tolerating low-dose lisinopril and metoprolol so far.   Current Outpatient Medications  Medication Sig Dispense Refill  . acetaminophen (TYLENOL) 500 MG tablet Take 500 mg by mouth every 6 (six) hours as needed for moderate pain.    Marland Kitchen aspirin 81 MG tablet Take 81 mg by mouth daily.    Marland Kitchen atorvastatin (LIPITOR) 20 MG tablet Take 1 tablet (20 mg total) by mouth daily at 6 PM. 30 tablet 6  . blood glucose meter kit and supplies KIT Dispense based on patient and insurance preference. Use up to four times daily as directed. (FOR ICD-9 250.00, 250.01). 1 each 0  . Blood Glucose Monitoring Suppl (ONE TOUCH ULTRA 2) w/Device KIT USE TO TEST UP TO 4 TIMES PER DAY. 1 each 1  . cholecalciferol (VITAMIN D) 1000 UNITS tablet Take 1,000 Units by mouth daily.    . famotidine (PEPCID) 20 MG tablet  Take 1 tablet (20 mg total) by mouth daily. 30 tablet 6  . furosemide (LASIX) 40 MG tablet Take 1 tablet (40 mg total) by mouth daily. 30 tablet 6  . glucose blood (ONE TOUCH ULTRA TEST) test strip USE TO TEST UP TO 4 TIMES A DAY. 100 each 11  . ibuprofen (ADVIL,MOTRIN) 200 MG tablet Take 200 mg by mouth every 6 (six) hours as needed for mild pain. Reported on 10/26/2015    . lisinopril (PRINIVIL,ZESTRIL) 2.5 MG tablet Take 1 tablet (2.5 mg total) by mouth daily. 30 tablet 6  . metFORMIN (GLUCOPHAGE) 850 MG tablet TAKE 1 TABLET BY MOUTH TWICE A DAY WITH A MEAL 180 tablet 1  . metoprolol tartrate (LOPRESSOR) 25 MG tablet Take 0.5 tablets (12.5 mg total) by mouth 2 (two) times daily. 16 tablet 6  . Multiple Vitamins-Minerals (MULTIVITAMIN WITH MINERALS) tablet Take 1 tablet by mouth daily.    . nitroGLYCERIN (NITROSTAT) 0.4 MG SL tablet Place 1 tablet (0.4 mg total) under the tongue every 5 (five) minutes as needed for chest pain. 25 tablet 1  . thiamine (VITAMIN B-1) 100 MG tablet Take 100 mg by mouth daily.     No current facility-administered medications for this visit.     Allergies  Allergen Reactions  . Hornet Venom Swelling  . Metoprolol     depression  . Tetanus Toxoids Other (  See Comments)    Big red rash and swelling in the muscle    Past Medical History:  Diagnosis Date  . Acute combined systolic and diastolic heart failure ([place]) 06/16/2018  . Allergy   . CHF (congestive heart failure) ([place])   . GERD (gastroesophageal reflux disease)   . NICM (nonischemic cardiomyopathy) ([place]) 06/21/2018  . NSVT (nonsustained ventricular tachycardia) ([place]) 06/13/2015  . PVC's (premature ventricular contractions) 06/11/2015  . S/P cardiac cath, 06/20/18 06/21/2018  . Type 2 diabetes mellitus ([place])     Social History   Socioeconomic History  . Marital status: Married    Spouse name: Not on file  . Number of children: 5  . Years of education: Not on file  . Highest education level: Not  on file  Occupational History  . 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 Financial resource strain: Not on file  . Food insecurity:    Worry: Not on file    Inability: Not on file  . Transportation needs:    Medical: Not on file    Non-medical: Not on file  Tobacco Use  . Smoking status: Never Smoker  . Smokeless tobacco: Never Used  Substance and Sexual Activity  . Alcohol use: No  . Drug use: No  . Sexual activity: Yes  Lifestyle  . Physical activity:    Days per week: Not on file    Minutes per session: Not on file  . Stress: Not on file  Relationships  . Social connections:    Talks on phone: Not on file    Gets together: Not on file    Attends religious service: Not on file    Active member of club or organization: Not on file    Attends meetings of clubs or organizations: Not on file    Relationship status: Not on file  . Intimate partner violence:    Fear of current or ex partner: Not on file    Emotionally abused: Not on file    Physically abused: Not on file    Forced sexual activity: Not on file  Other Topics Concern  . Not on file  Social History Narrative   Married.  Education: [employer].           Family History  Problem Relation Age of Onset  . Heart disease Mother   . COPD Mother   . Emphysema Mother   . Cancer Mother        oral  . Heart disease Father   . Diabetes Father   . COPD Father   . Vision loss Father   . Skin cancer Father        skin  . Hyperlipidemia Father   . Hypertension Father   . Heart disease Sister   . Graves' disease Sister   . Diabetes Sister   . Hyperlipidemia Sister   . Heart failure Sister 42  . Heart disease Brother   . Hyperlipidemia Brother   . Cancer Paternal Grandmother        unknown  . Prostate cancer Paternal Grandfather        Prostate cancer     Review of Systems: General: negative for chills, fever, night sweats or weight changes.  Cardiovascular: negative for chest pain, dyspnea on exertion,  edema, orthopnea, palpitations, paroxysmal nocturnal dyspnea or shortness of breath Dermatological: negative for rash Respiratory: negative for cough or wheezing Urologic: negative for hematuria Abdominal: negative for nausea, vomiting, diarrhea, bright red blood per rectum, melena, or  hematemesis Neurologic: negative for visual changes, syncope, or dizziness All other systems reviewed and are otherwise negative except as noted above.    Last menstrual period 10/14/2014.  General appearance: alert, cooperative, no distress and mildly obese Neck: no carotid bruit and no JVD Lungs: clear to auscultation bilaterally Heart: regular rate and rhythm Extremities: no edema Skin: pale, warm, dry Neurologic: Grossly normal   ASSESSMENT AND PLAN:   NICM (nonischemic cardiomyopathy) ([facility]) EF < 25%-NICM  Acute combined systolic and diastolic heart failure ([place]) DC'd 06/21/18 after 9 lb diuresis  Nonrheumatic mitral valve regurgitation Moderate-severe MR, likely functional at TEE Nov 2019  Non-insulin treated type 2 diabetes mellitus ([place]) On Glucophage  PVC's (premature ventricular contractions)  Propafenone stopped Nov 2019 and low dose beta blocker added (Lopressor caused depression in the past)  Normal coronary arteries 2016 and at cath Nov 2019   PLAN [name] repeat blood pressure was 100/60.  Usually I would defer starting Entresto to the heart failure clinic but since she has a cough I am going to go ahead and stop her ACE inhibitor now and start low-dose Entresto.  I will refill her metoprolol for another 2 weeks and defer starting carvedilol to the heart failure clinic, she has an appointment there next week.   Kerin Ransom PA-C 07/05/2018 9:04 AM

## 2018-07-05 NOTE — Assessment & Plan Note (Signed)
Moderate-severe MR, likely functional at TEE Nov 2019

## 2018-07-05 NOTE — Assessment & Plan Note (Signed)
On Glucophage 

## 2018-07-10 NOTE — Progress Notes (Signed)
Advanced Heart Failure Clinic Note   Referring Physician: PCP: [name], MD PCP-Cardiologist: Skeet Latch, MD   HPI:  Emily [name] is a 57 y.o. 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    Admitted 11/16 - 06/21/18 with Acute systolic CHF. Noted by be NICM by cMRI and cath. At least moderate, functional MR noted. Diuresed with IV lasix and meds adjusted as tolerated.  Seen in [facility] clinic 07/05/18 and was doing well. Change to low dose Entresto with anticipation of close HF clinic follow up. She remains on Lopressor.   She presents today for post hospital follow up to establish in the HF clinic with plans for aggressive med titration. She has been doing well since recent admit. Feeling a little stronger each day. She has had a dry cough, but has a grandson and husband who both have a similar URI. She is tolerating BB well (depressed mood in the past) and denies lightheadedness or dizziness. In setting of recent switch to [facility]. She denies SOB in the house, and was able to slowly start doing chores this week. She has a very good understanding of her disease and medications.   TEE 06/20/18 LVEF 25-30%, Normal RV size and mildly decreased function, Mod LAE, Small PFO, Mild TR, Trivial PI, Moderate/Severe MR but looked more severe visually. MR is central suggestive of functional MR  cMRI 06/20/18 1.  Mildly dilated LV with EF 28%, diffuse hypokinesis. 2.  Normal RV size with mildly decreased systolic function, EF 40%. 3. No myocardial LGE, so no definitive evidence for prior myocarditis, infiltrative disease, or MI. 4. At least moderate mitral regurgitation. This was evaluated by TEE today.  [facility] 06/19/18 Normal coronary arteries Hemodynamics (mmHg) RA mean 6 RV 41/8 PA 42/20 (31) PCWP 25 AO 96/58 Cardiac Output (Fick) 3.1 Cardiac Index (Fick) 1.8 SVR: 18.9 PVR 1.7 WU  Review of systems complete and found to be negative unless  listed in HPI.    Past Medical History 1. Chronic systolic CHF-> NICM by cath 06/2018. cMRI showed EF 28%, mildly decreased RV size, and no myocardial LGE. Has at least moderate, functional MR by TEE. Plan to aggressively titrate meds and re-assess. 2. Mod/Severe MR: Suspect functional by TEE 06/20/18 - Plan to aggressively treat her cardiomyopathy and follow up. 3. DM2 4. HLD  Past Medical History:  Diagnosis Date  . Acute combined systolic and diastolic heart failure ([place]) 06/16/2018  . Allergy   . CHF (congestive heart failure) ([place])   . GERD (gastroesophageal reflux disease)   . NICM (nonischemic cardiomyopathy) ([place]) 06/21/2018  . NSVT (nonsustained ventricular tachycardia) ([place]) 06/13/2015  . PVC's (premature ventricular contractions) 06/11/2015  . S/P cardiac cath, 06/20/18 06/21/2018  . Type 2 diabetes mellitus ([place])     Current Outpatient Medications  Medication Sig Dispense Refill  . acetaminophen (TYLENOL) 500 MG tablet Take 500 mg by mouth every 6 (six) hours as needed for moderate pain.    Marland Kitchen aspirin 81 MG tablet Take 81 mg by mouth daily.    Marland Kitchen atorvastatin (LIPITOR) 20 MG tablet Take 1 tablet (20 mg total) by mouth daily at 6 PM. 30 tablet 6  . blood glucose meter kit and supplies KIT Dispense based on patient and insurance preference. Use up to four times daily as directed. (FOR ICD-9 250.00, 250.01). 1 each 0  . Blood Glucose Monitoring Suppl (ONE TOUCH ULTRA 2) w/Device KIT USE TO TEST UP TO 4 TIMES PER DAY.  1 each 1  . cholecalciferol (VITAMIN D) 1000 UNITS tablet Take 1,000 Units by mouth daily.    . famotidine (PEPCID) 20 MG tablet Take 1 tablet (20 mg total) by mouth daily. 30 tablet 6  . furosemide (LASIX) 40 MG tablet Take 1 tablet (40 mg total) by mouth daily. 30 tablet 6  . glucose blood (ONE TOUCH ULTRA TEST) test strip USE TO TEST UP TO 4 TIMES A DAY. 100 each 11  . ibuprofen (ADVIL,MOTRIN) 200 MG tablet Take 200 mg by mouth every 6 (six) hours as needed for  mild pain. Reported on 10/26/2015    . metFORMIN (GLUCOPHAGE) 850 MG tablet TAKE 1 TABLET BY MOUTH TWICE A DAY WITH A MEAL 180 tablet 1  . metoprolol tartrate (LOPRESSOR) 25 MG tablet Take 0.5 tablets (12.5 mg total) by mouth 2 (two) times daily. 16 tablet 6  . Multiple Vitamins-Minerals (MULTIVITAMIN WITH MINERALS) tablet Take 1 tablet by mouth daily.    . sacubitril-valsartan (ENTRESTO) 24-26 MG Take 1 tablet by mouth 2 (two) times daily. 60 tablet 6  . thiamine (VITAMIN B-1) 100 MG tablet Take 100 mg by mouth daily.    . nitroGLYCERIN (NITROSTAT) 0.4 MG SL tablet Place 1 tablet (0.4 mg total) under the tongue every 5 (five) minutes as needed for chest pain. (Patient not taking: Reported on 07/11/2018) 25 tablet 1   No current facility-administered medications for this encounter.     Allergies  Allergen Reactions  . Hornet Venom Swelling  . Metoprolol     depression  . Tetanus Toxoids Other (See Comments)    Big red rash and swelling in the muscle      Social History   Socioeconomic History  . Marital status: Married    Spouse name: Not on file  . Number of children: 5  . Years of education: Not on file  . Highest education level: Not on file  Occupational History  . 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 Financial resource strain: Not on file  . Food insecurity:    Worry: Not on file    Inability: Not on file  . Transportation needs:    Medical: Not on file    Non-medical: Not on file  Tobacco Use  . Smoking status: Never Smoker  . Smokeless tobacco: Never Used  Substance and Sexual Activity  . Alcohol use: No  . Drug use: No  . Sexual activity: Yes  Lifestyle  . Physical activity:    Days per week: Not on file    Minutes per session: Not on file  . Stress: Not on file  Relationships  . Social connections:    Talks on phone: Not on file    Gets together: Not on file    Attends religious service: Not on file    Active member of club or organization: Not on file      Attends meetings of clubs or organizations: Not on file    Relationship status: Not on file  . Intimate partner violence:    Fear of current or ex partner: Not on file    Emotionally abused: Not on file    Physically abused: Not on file    Forced sexual activity: Not on file  Other Topics Concern  . Not on file  Social History Narrative   Married.  Education: [employer].            Family History  Problem Relation Age of Onset  . Heart disease Mother   .  COPD Mother   . Emphysema Mother   . Cancer Mother        oral  . Heart disease Father   . Diabetes Father   . COPD Father   . Vision loss Father   . Skin cancer Father        skin  . Hyperlipidemia Father   . Hypertension Father   . Heart disease Sister   . Graves' disease Sister   . Diabetes Sister   . Hyperlipidemia Sister   . Heart failure Sister 77  . Heart disease Brother   . Hyperlipidemia Brother   . Cancer Paternal Grandmother        unknown  . Prostate cancer Paternal Grandfather        Prostate cancer    Vitals:   07/11/18 1035  BP: 118/66  Pulse: 98  SpO2: 92%  Weight: 73.9 kg (163 lb)  Height: 4' 11.5" (1.511 m)    Wt Readings from Last 3 Encounters:  07/11/18 73.9 kg (163 lb)  06/21/18 74.2 kg (163 lb 9.6 oz)  04/25/18 76.7 kg (169 lb)     PHYSICAL EXAM: General:  Well appearing. No respiratory difficulty HEENT: normal Neck: supple. no JVD. Carotids 2+ bilat; no bruits. No lymphadenopathy or thyromegaly appreciated. Cor: PMI nondisplaced. Regular rate & rhythm. No rubs, gallops or murmurs. Lungs: clear Abdomen: soft, nontender, nondistended. No hepatosplenomegaly. No bruits or masses. Good bowel sounds. Extremities: no cyanosis, clubbing, rash, edema Neuro: alert & oriented x 3, cranial nerves grossly intact. moves all 4 extremities w/o difficulty. Affect pleasant.  ECG: 06/20/18 NSR at 99 bpm with occasional PVCs 06/20/18   ASSESSMENT & PLAN:  1. Chronic systolic CHF-> NICM by  cath 06/2018. cMRI showed EF 28%, mildly decreased RV size, and no myocardial LGE. Has at least moderate, functional MR by TEE. Plan to aggressively titrate meds and re-assess. - NYHA II-III symptoms, confounded by URI. - Volume status stable on exam.   - Continue lasix 40 mg daily - Stop Lopressor. Change to Toprol Xl 25 mg daily (OK to take in evenings.) - Continue Entresto 24/26 mg BID for now with very recent change. BMET today.  - Reinforced fluid restriction to < 2 L daily, sodium restriction to less than 2000 mg daily, and the importance of daily weights.   2. Mod/Severe MR: Suspect functional by TEE 06/20/18 - Plan to aggressively treat her cardiomyopathy and follow up. 3. DM2 - Per PCP. Should consider Farxiga/Jardiance.  4. HLD - Per [facility].  Labs and meds as above. Will keep appt with Dr. Oval Linsey in 2 weeks. Would consider adding spiro at that visit. We will see in our APP clinic in 6 weeks, and plan 3 month appt with Dr. Aundra Dubin for Echo and re-evaluation of EF. Pt knows to call with any questions or concerns.  Shirley Friar, PA-C 07/11/18   Greater than 50% of the 25 minute visit was spent in counseling/coordination of care regarding disease state education, salt/fluid restriction, sliding scale diuretics, and medication compliance.

## 2018-07-11 ENCOUNTER — Encounter (HOSPITAL_COMMUNITY): Payer: Self-pay

## 2018-07-11 ENCOUNTER — Other Ambulatory Visit: Payer: Self-pay

## 2018-07-11 ENCOUNTER — Ambulatory Visit (HOSPITAL_COMMUNITY)
Admission: RE | Admit: 2018-07-11 | Discharge: 2018-07-11 | Disposition: A | Discharge status: Home / Self Care | Payer: 59 | Source: Ambulatory Visit | Attending: Internal Medicine | Admitting: Internal Medicine

## 2018-07-11 VITALS — BP 118/66 | HR 98 | Ht 59.5 in | Wt 163.0 lb

## 2018-07-11 DIAGNOSIS — Z833 Family history of diabetes mellitus: Secondary | ICD-10-CM | POA: Insufficient documentation

## 2018-07-11 DIAGNOSIS — Z8249 Family history of ischemic heart disease and other diseases of the circulatory system: Secondary | ICD-10-CM | POA: Diagnosis not present

## 2018-07-11 DIAGNOSIS — I428 Other cardiomyopathies: Secondary | ICD-10-CM | POA: Insufficient documentation

## 2018-07-11 DIAGNOSIS — I5022 Chronic systolic (congestive) heart failure: Secondary | ICD-10-CM | POA: Diagnosis present

## 2018-07-11 DIAGNOSIS — E669 Obesity, unspecified: Secondary | ICD-10-CM | POA: Insufficient documentation

## 2018-07-11 DIAGNOSIS — Z7984 Long term (current) use of oral hypoglycemic drugs: Secondary | ICD-10-CM | POA: Insufficient documentation

## 2018-07-11 DIAGNOSIS — Z7982 Long term (current) use of aspirin: Secondary | ICD-10-CM | POA: Insufficient documentation

## 2018-07-11 DIAGNOSIS — I340 Nonrheumatic mitral (valve) insufficiency: Secondary | ICD-10-CM | POA: Diagnosis not present

## 2018-07-11 DIAGNOSIS — Z79899 Other long term (current) drug therapy: Secondary | ICD-10-CM | POA: Insufficient documentation

## 2018-07-11 DIAGNOSIS — Z888 Allergy status to other drugs, medicaments and biological substances status: Secondary | ICD-10-CM | POA: Diagnosis not present

## 2018-07-11 DIAGNOSIS — I5042 Chronic combined systolic (congestive) and diastolic (congestive) heart failure: Secondary | ICD-10-CM | POA: Diagnosis not present

## 2018-07-11 DIAGNOSIS — K219 Gastro-esophageal reflux disease without esophagitis: Secondary | ICD-10-CM | POA: Insufficient documentation

## 2018-07-11 DIAGNOSIS — E119 Type 2 diabetes mellitus without complications: Secondary | ICD-10-CM

## 2018-07-11 DIAGNOSIS — I5032 Chronic diastolic (congestive) heart failure: Secondary | ICD-10-CM | POA: Diagnosis not present

## 2018-07-11 DIAGNOSIS — E785 Hyperlipidemia, unspecified: Secondary | ICD-10-CM | POA: Insufficient documentation

## 2018-07-11 DIAGNOSIS — E7800 Pure hypercholesterolemia, unspecified: Secondary | ICD-10-CM

## 2018-07-11 DIAGNOSIS — R9431 Abnormal electrocardiogram [ECG] [EKG]: Secondary | ICD-10-CM | POA: Diagnosis not present

## 2018-07-11 DIAGNOSIS — Z791 Long term (current) use of non-steroidal anti-inflammatories (NSAID): Secondary | ICD-10-CM | POA: Diagnosis not present

## 2018-07-11 LAB — BASIC METABOLIC PANEL
Anion gap: 11 (ref 5–15)
BUN: 14 mg/dL (ref 6–20)
CHLORIDE: 107 mmol/L (ref 98–111)
CO2: 24 mmol/L (ref 22–32)
CREATININE: 0.77 mg/dL (ref 0.44–1.00)
Calcium: 9.6 mg/dL (ref 8.9–10.3)
GFR calc Af Amer: >60 mL/min (ref >60)
Glucose, Bld: 159 mg/dL — ABNORMAL HIGH (ref 70–99)
Potassium: 3.9 mmol/L (ref 3.5–5.1)
Sodium: 142 mmol/L (ref 135–145)

## 2018-07-11 LAB — MAGNESIUM: MAGNESIUM: 1.7 mg/dL (ref 1.7–2.4)

## 2018-07-11 MED ORDER — METOPROLOL SUCCINATE ER 25 MG PO TB24: 25.0000 mg | ORAL_TABLET | Freq: Every day | ORAL | 3 refills | Status: DC

## 2018-07-11 NOTE — Addendum Note (Signed)
Encounter addended by: Jorge Ny, LCSW on: 07/11/2018 1:20 PM  Actions taken: Visit Navigator Flowsheet section accepted

## 2018-07-11 NOTE — Patient Instructions (Addendum)
STOP Metoprolol Tartrate  START Metoprolol Succinate 25 mg, one tab daily at bedime  Labs today We will only contact you if something comes back abnormal or we need to make some changes. Otherwise no news is good news!  Your physician recommends that you schedule a follow-up appointment in: 6 weeks  in the Advanced Practitioners (PA/NP) Clinic   Your physician recommends that you schedule a follow-up appointment in: 3 months with Dr Aundra Dubin and echo  Your physician has requested that you have an echocardiogram. Echocardiography is a painless test that uses sound waves to create images of your heart. It provides your doctor with information about the size and shape of your heart and how well your heart's chambers and valves are working. This procedure takes approximately one hour. There are no restrictions for this procedure.  Do the following things EVERYDAY: 1) Weigh yourself in the morning before breakfast. Write it down and keep it in a log. 2) Take your medicines as prescribed 3) Eat low salt foods-Limit salt (sodium) to 2000 mg per day.  4) Stay as active as you can everyday 5) Limit all fluids for the day to less than 2 liters   At the [facility], you and your health needs are our priority. As part of our continuing mission to provide you with exceptional heart care, we have created designated Provider Care Teams. These Care Teams include your primary Cardiologist (physician) and Advanced Practice Providers (APPs- Physician Assistants and Nurse Practitioners) who all work together to provide you with the care you need, when you need it.   You may see any of the following providers on your designated Care Team at your next follow up: Marland Kitchen Dr Glori Bickers . Dr [name] . Darrick Grinder, NP . Lillia Mountain, NP . Rebecca Eaton

## 2018-07-13 ENCOUNTER — Other Ambulatory Visit: Payer: Self-pay

## 2018-07-13 ENCOUNTER — Encounter: Payer: Self-pay | Admitting: Family Medicine

## 2018-07-13 ENCOUNTER — Ambulatory Visit (INDEPENDENT_AMBULATORY_CARE_PROVIDER_SITE_OTHER): Payer: 59

## 2018-07-13 ENCOUNTER — Ambulatory Visit (INDEPENDENT_AMBULATORY_CARE_PROVIDER_SITE_OTHER): Payer: 59 | Admitting: Family Medicine

## 2018-07-13 VITALS — BP 108/68 | HR 86 | Temp 98.0°F | Resp 16 | Ht 60.24 in | Wt 162.0 lb

## 2018-07-13 DIAGNOSIS — Z1231 Encounter for screening mammogram for malignant neoplasm of breast: Secondary | ICD-10-CM

## 2018-07-13 DIAGNOSIS — I428 Other cardiomyopathies: Secondary | ICD-10-CM

## 2018-07-13 DIAGNOSIS — R05 Cough: Secondary | ICD-10-CM | POA: Diagnosis not present

## 2018-07-13 DIAGNOSIS — R059 Cough, unspecified: Secondary | ICD-10-CM

## 2018-07-13 DIAGNOSIS — I5032 Chronic diastolic (congestive) heart failure: Secondary | ICD-10-CM | POA: Diagnosis not present

## 2018-07-13 DIAGNOSIS — Z0001 Encounter for general adult medical examination with abnormal findings: Secondary | ICD-10-CM | POA: Diagnosis not present

## 2018-07-13 DIAGNOSIS — E119 Type 2 diabetes mellitus without complications: Secondary | ICD-10-CM | POA: Diagnosis not present

## 2018-07-13 DIAGNOSIS — E7800 Pure hypercholesterolemia, unspecified: Secondary | ICD-10-CM | POA: Diagnosis not present

## 2018-07-13 DIAGNOSIS — I5041 Acute combined systolic (congestive) and diastolic (congestive) heart failure: Secondary | ICD-10-CM

## 2018-07-13 DIAGNOSIS — E669 Obesity, unspecified: Secondary | ICD-10-CM

## 2018-07-13 DIAGNOSIS — Z0000 Encounter for general adult medical examination without abnormal findings: Secondary | ICD-10-CM

## 2018-07-13 LAB — POCT URINALYSIS DIP (MANUAL ENTRY)
BILIRUBIN UA: NEGATIVE
Blood, UA: NEGATIVE
GLUCOSE UA: NEGATIVE mg/dL
Ketones, POC UA: NEGATIVE mg/dL
NITRITE UA: NEGATIVE
Protein Ur, POC: NEGATIVE mg/dL
Spec Grav, UA: 1.02 (ref 1.010–1.025)
Urobilinogen, UA: 1 E.U./dL
pH, UA: 5.5 (ref 5.0–8.0)

## 2018-07-13 LAB — POCT CBC
Granulocyte percent: 63.9 %G (ref 37–80)
HEMATOCRIT: 40.9 % (ref 29–41)
HEMOGLOBIN: 14.1 g/dL (ref 11–14.6)
Lymph, poc: 2.5 (ref 0.6–3.4)
MCH, POC: 30.5 pg (ref 27–31.2)
MCHC: 34.4 g/dL (ref 31.8–35.4)
MCV: 88.5 fL (ref 76–111)
MID (cbc): 0.5 (ref 0–0.9)
MPV: 6.9 fL (ref 0–99.8)
POC GRANULOCYTE: 5.2 (ref 2–6.9)
POC LYMPH %: 30.2 % (ref 10–50)
POC MID %: 5.9 % (ref 0–12)
Platelet Count, POC: 252 10*3/uL (ref 142–424)
RBC: 4.61 M/uL (ref 4.04–5.48)
RDW, POC: 13.3 %
WBC: 8.2 10*3/uL (ref 4.6–10.2)

## 2018-07-13 MED ORDER — AZITHROMYCIN 250 MG PO TABS: ORAL_TABLET | ORAL | 0 refills | Status: DC

## 2018-07-13 MED ORDER — GLUCOSE BLOOD VI STRP: ORAL_STRIP | 11 refills | Status: DC

## 2018-07-13 MED ORDER — GUAIFENESIN ER 1200 MG PO TB12: 1.0000 | ORAL_TABLET | Freq: Two times a day (BID) | ORAL | 1 refills | Status: AC | PRN

## 2018-07-13 MED ORDER — BENZONATATE 200 MG PO CAPS: 200.0000 mg | ORAL_CAPSULE | Freq: Three times a day (TID) | ORAL | 0 refills | Status: DC | PRN

## 2018-07-13 NOTE — Progress Notes (Signed)
Subjective:    Patient ID: Emily [name]; female   DOB: March 02, 1961; 57 y.o.   MRN: [id number]  Chief Complaint  Patient presents with  . Annual Exam    HPI Primary Preventative Screenings: Cervical Cancer: due next yr Family Planning: STI screening: Breast Cancer: Colorectal Cancer: Tobacco use/EtOH/substances: Bone Density: Cardiac: Weight/Blood sugar/Diet/Exercise: BMI Readings from Last 3 Encounters:  07/13/18 31.39 kg/m  07/11/18 32.37 kg/m  06/21/18 33.04 kg/m   Lab Results  Component Value Date   HGBA1C 6.5 (A) 04/25/2018   OTC/Vit/Supp/Herbal: Dentist/Optho: has appt next wk Immunizations:  Immunization History  Administered Date(s) Administered  . Influenza,inj,Quad PF,6+ Mos 06/16/2016, 06/01/2017, 04/25/2018  . Pneumococcal Polysaccharide-23 09/08/2016     DM: a1cs well controlled, but when she was in the hospital she was on ssi and metformin held.  Fastings high as she goes into gluconeogenesis every night ever Decreased energy    CHF: follows w/ cardiology.  Dr. Oval Linsey is her general cardiololgist but she was seen 2d ago to  URI: She has been coughing for the past month - put on lasix for fluid around lungs - she did get better after diuresis.  But then 3 weeks has had yellow slight tinge mucous. NO f/c. +rhinitis and pnd.  Has not been taking any otc cold medicine. Has been using humidifier. NO sinus pressures or pain.  Not sleeping well due to coughing.    optho exam is scheduled for 12/30 - seen by Dr. Quay Burow.   Medical History: Past Medical History:  Diagnosis Date  . Acute combined systolic and diastolic heart failure ([place]) 06/16/2018  . Allergy   . CHF (congestive heart failure) ([place])   . GERD (gastroesophageal reflux disease)   . NICM (nonischemic cardiomyopathy) ([place]) 06/21/2018  . NSVT (nonsustained ventricular tachycardia) ([place]) 06/13/2015  . PVC's (premature ventricular contractions) 06/11/2015  . S/P cardiac cath, 06/20/18  06/21/2018  . Type 2 diabetes mellitus ([place])    Past Surgical History:  Procedure Laterality Date  . CARDIAC CATHETERIZATION N/A 06/19/2015   Procedure: Left Heart Cath and Coronary Angiography;  Surgeon: Leonie Man, MD;  Location: [facility];  Service: Cardiovascular;  Laterality: N/A;  . RIGHT/LEFT HEART CATH AND CORONARY ANGIOGRAPHY N/A 06/19/2018   Procedure: RIGHT/LEFT HEART CATH AND CORONARY ANGIOGRAPHY;  Surgeon: Troy Sine, MD;  Location: [facility];  Service: Cardiovascular;  Laterality: N/A;  . TEE WITHOUT CARDIOVERSION N/A 06/20/2018   Procedure: TRANSESOPHAGEAL ECHOCARDIOGRAM (TEE);  Surgeon: Larey Dresser, MD;  Location: [facility];  Service: Cardiovascular;  Laterality: N/A;  . TONSILLECTOMY     when she was 3   Current Outpatient Medications on File Prior to Visit  Medication Sig Dispense Refill  . acetaminophen (TYLENOL) 500 MG tablet Take 500 mg by mouth every 6 (six) hours as needed for moderate pain.    Marland Kitchen aspirin 81 MG tablet Take 81 mg by mouth daily.    Marland Kitchen atorvastatin (LIPITOR) 20 MG tablet Take 1 tablet (20 mg total) by mouth daily at 6 PM. 30 tablet 6  . blood glucose meter kit and supplies KIT Dispense based on patient and insurance preference. Use up to four times daily as directed. (FOR ICD-9 250.00, 250.01). 1 each 0  . Blood Glucose Monitoring Suppl (ONE TOUCH ULTRA 2) w/Device KIT USE TO TEST UP TO 4 TIMES PER DAY. 1 each 1  . cholecalciferol (VITAMIN D) 1000 UNITS tablet Take 1,000 Units by mouth daily.    Marland Kitchen  famotidine (PEPCID) 20 MG tablet Take 1 tablet (20 mg total) by mouth daily. 30 tablet 6  . furosemide (LASIX) 40 MG tablet Take 1 tablet (40 mg total) by mouth daily. 30 tablet 6  . glucose blood (ONE TOUCH ULTRA TEST) test strip USE TO TEST UP TO 4 TIMES A DAY. 100 each 11  . ibuprofen (ADVIL,MOTRIN) 200 MG tablet Take 200 mg by mouth every 6 (six) hours as needed for mild pain. Reported on 10/26/2015    . metFORMIN  (GLUCOPHAGE) 850 MG tablet TAKE 1 TABLET BY MOUTH TWICE A DAY WITH A MEAL 180 tablet 1  . metoprolol succinate (TOPROL-XL) 25 MG 24 hr tablet Take 1 tablet (25 mg total) by mouth daily. Take with or immediately following a meal. 90 tablet 3  . Multiple Vitamins-Minerals (MULTIVITAMIN WITH MINERALS) tablet Take 1 tablet by mouth daily.    . nitroGLYCERIN (NITROSTAT) 0.4 MG SL tablet Place 1 tablet (0.4 mg total) under the tongue every 5 (five) minutes as needed for chest pain. 25 tablet 1  . sacubitril-valsartan (ENTRESTO) 24-26 MG Take 1 tablet by mouth 2 (two) times daily. 60 tablet 6  . thiamine (VITAMIN B-1) 100 MG tablet Take 100 mg by mouth daily.     No current facility-administered medications on file prior to visit.    Allergies  Allergen Reactions  . Hornet Venom Swelling  . Metoprolol     depression  . Tetanus Toxoids Other (See Comments)    Big red rash and swelling in the muscle   Family History  Problem Relation Age of Onset  . Heart disease Mother   . COPD Mother   . Emphysema Mother   . Cancer Mother        oral  . Heart disease Father   . Diabetes Father   . COPD Father   . Vision loss Father   . Skin cancer Father        skin  . Hyperlipidemia Father   . Hypertension Father   . Heart disease Sister   . Graves' disease Sister   . Diabetes Sister   . Hyperlipidemia Sister   . Heart failure Sister 52  . Heart disease Brother   . Hyperlipidemia Brother   . Cancer Paternal Grandmother        unknown  . Prostate cancer Paternal Grandfather        Prostate cancer   Social History   Socioeconomic History  . Marital status: Married    Spouse name: Not on file  . Number of children: 5  . Years of education: Not on file  . Highest education level: Master's degree (e.g., MA, MS, MEng, MEd, MSW, MBA)  Occupational History  . 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 Financial resource strain: Not very hard  . Food insecurity:    Worry: Never true    Inability:  Never true  . Transportation needs:    Medical: No    Non-medical: No  Tobacco Use  . Smoking status: Never Smoker  . Smokeless tobacco: Never Used  Substance and Sexual Activity  . Alcohol use: No  . Drug use: No  . Sexual activity: Yes  Lifestyle  . Physical activity:    Days per week: Not on file    Minutes per session: Not on file  . Stress: Not on file  Relationships  . Social connections:    Talks on phone: Not on file    Gets together: Not on file  Attends religious service: Not on file    Active member of club or organization: Not on file    Attends meetings of clubs or organizations: Not on file    Relationship status: Not on file  Other Topics Concern  . Not on file  Social History Narrative   Married.  Education: [employer].         Depression screen [facility] 2/9 04/25/2018 07/03/2017 06/01/2017 09/08/2016 06/16/2016  Decreased Interest 0 0 0 0 0  Down, Depressed, Hopeless 0 0 0 0 0  PHQ - 2 Score 0 0 0 0 0     ROS Otherwise as noted in HPI.  Objective:  BP 108/68   Pulse 86   Temp 98 F (36.7 C) (Oral)   Resp 16   Ht 5' 0.24" (1.53 m)   Wt 162 lb (73.5 kg)   LMP 10/14/2014   SpO2 100%   BMI 31.39 kg/m   Visual Acuity Screening   Right eye Left eye Both eyes  Without correction:     With correction: _0   Physical Exam Constitutional:      General: She is not in acute distress.    Appearance: She is well-developed. She is ill-appearing. She is not diaphoretic.  HENT:     Head: Normocephalic and atraumatic.     Right Ear: Tympanic membrane, ear canal and external ear normal. No middle ear effusion.     Left Ear: Tympanic membrane, ear canal and external ear normal.  No middle ear effusion.     Nose: Mucosal edema present. No rhinorrhea.     Mouth/Throat:     Pharynx: Uvula midline. Posterior oropharyngeal erythema (+ PND) present. No oropharyngeal exudate or uvula swelling.     Tonsils: No tonsillar exudate or tonsillar abscesses.    Eyes:     General: No scleral icterus.       Right eye: No discharge.        Left eye: No discharge.     Conjunctiva/sclera: Conjunctivae normal.  Neck:     Musculoskeletal: Normal range of motion and neck supple.  Cardiovascular:     Rate and Rhythm: Normal rate and regular rhythm.     Heart sounds: Normal heart sounds.  Pulmonary:     Effort: Pulmonary effort is normal.     Breath sounds: Examination of the right-lower field reveals rales. Examination of the left-lower field reveals rales. Rales (veyr faint fine insp L>R) present. No decreased breath sounds.  Lymphadenopathy:     Head:     Right side of head: Submandibular adenopathy present. No preauricular or posterior auricular adenopathy.     Left side of head: Submandibular adenopathy present. No preauricular or posterior auricular adenopathy.     Cervical: No cervical adenopathy.     Upper Body:     Right upper body: No supraclavicular adenopathy.     Left upper body: No supraclavicular adenopathy.  Skin:    General: Skin is warm and dry.     Findings: No erythema.  Neurological:     Mental Status: She is oriented to person, place, and time. She is lethargic.  Psychiatric:        Behavior: Behavior normal.    Diabetic Foot Exam - Simple   Simple Foot Form Visual Inspection No deformities, no ulcerations, no other skin breakdown bilaterally:  Yes Sensation Testing Intact to touch and monofilament testing bilaterally:  Yes Pulse Check Posterior Tibialis and Dorsalis pulse intact bilaterally:  Yes Comments  [facility] Outpatient Visit on 07/11/2018  Component Date Value Ref Range Status  . Sodium 07/11/2018 142  135 - 145 mmol/L Final  . Potassium 07/11/2018 3.9  3.5 - 5.1 mmol/L Final  . Chloride 07/11/2018 107  98 - 111 mmol/L Final  . CO2 07/11/2018 24  22 - 32 mmol/L Final  . Glucose, Bld 07/11/2018 159* 70 - 99 mg/dL Final  . BUN 07/11/2018 14  6 - 20 mg/dL Final  . Creatinine, Ser  07/11/2018 0.77  0.44 - 1.00 mg/dL Final  . Calcium 07/11/2018 9.6  8.9 - 10.3 mg/dL Final  . GFR calc non Af Amer 07/11/2018 >60  >60 mL/min Final  . GFR calc Af Amer 07/11/2018 >60  >60 mL/min Final  . Anion gap 07/11/2018 11  5 - 15 Final   Performed at [facility], [place] [address], [place], [place] [address]  . Magnesium 07/11/2018 1.7  1.7 - 2.4 mg/dL Final   Performed at [facility], [place] [address], [place], [place] [address]    Vit B12 nml last yr microabl 04/2018  Dg Chest 2 View  Result Date: 07/13/2018 CLINICAL DATA:  Cough for 3 weeks EXAM: CHEST - 2 VIEW COMPARISON:  June 16, 2018 FINDINGS: There is atelectatic change in the medial left base. The lungs elsewhere are clear. Heart size and pulmonary vascularity are normal. No adenopathy. No bone lesions. IMPRESSION: Left base atelectasis. Lungs elsewhere clear. No adenopathy evident. Electronically Signed   By: Lowella Grip III M.D.   On: 07/13/2018 09:07   Dg Chest 2 View  Result Date: 06/16/2018 CLINICAL DATA:  Pt reports being sent here from UC with fluid on her lungs. Pt reports she has been SHOB, fatigue, and cough for the last 3 weeks. Pt has hx of CHF, Type 2 Diabetes. Nonsmoker. EXAM: CHEST - 2 VIEW COMPARISON:  03/23/2015 FINDINGS: Increase in central pulmonary vascular congestion. Worsening perihilar and bibasilar interstitial edema or infiltrates with probable septal lines peripherally. New small bilateral pleural effusions. Heart size upper limits normal. No pneumothorax. Visualized bones unremarkable. IMPRESSION: Probable mild pulmonary interstitial edema and small effusions, new since previous Electronically Signed   By: Lucrezia Europe M.D.   On: 06/16/2018 14:36   Assessment & Plan:   1. Annual physical exam   2. Non-insulin treated type 2 diabetes mellitus ([place])   3. Encounter for screening mammogram for breast cancer   4. Chronic diastolic heart failure ([place])   5. NICM (nonischemic  cardiomyopathy) ([facility])   6. Elevated LDL cholesterol level   7. Obesity (BMI 30.0-34.9)   8. Cough   9. Acute combined systolic and diastolic heart failure ([place])     rec starting sglt2 inhibitor at next OV due to HF indications - at this time pt has had so many med changes she is feeling overwhelmed - already worried about starting metoprolol since caused depression prior.   Patient will continue on current chronic medications other than changes noted above, so ok to refill when needed.   Reviewed all health maintenance recommendations per USPSTF guidelines.   See after visit summary for patient specific instructions.  Orders Placed This Encounter  Procedures  . MM DIGITAL SCREENING BILATERAL    Standing Status:   Future    Standing Expiration Date:   09/14/2019    Order Specific Question:   Reason for Exam (SYMPTOM  OR DIAGNOSIS REQUIRED)    Answer:   screening for breast cancer  Order Specific Question:   Is the patient pregnant?    Answer:   No    Order Specific Question:   Preferred imaging location?    Answer:   [facility]  . DG Chest 2 View    Standing Status:   Future    Number of Occurrences:   1    Standing Expiration Date:   07/13/2019    Order Specific Question:   Reason for Exam (SYMPTOM  OR DIAGNOSIS REQUIRED)    Answer:   cough x 3 wks w/ URI sxs, recent CHF dx    Order Specific Question:   Is the patient pregnant?    Answer:   No    Order Specific Question:   Preferred imaging location?    Answer:   External  . Comprehensive metabolic panel    Order Specific Question:   Has the patient fasted?    Answer:   No  . Hemoglobin A1c  . Lipid panel    Order Specific Question:   Has the patient fasted?    Answer:   No  . TSH  . POCT urinalysis dipstick  . POCT CBC  . HM DIABETES FOOT EXAM    Meds ordered this encounter  Medications  . benzonatate (TESSALON) 200 MG capsule    Sig: Take 1 capsule (200 mg total) by mouth 3 (three) times daily as needed for  cough.    Dispense:  40 capsule    Refill:  0  . glucose blood (ONE TOUCH ULTRA TEST) test strip    Sig: USE TO TEST UP TO 4 TIMES A DAY.    Dispense:  100 each    Refill:  11  . azithromycin (ZITHROMAX) 250 MG tablet    Sig: Take 2 tabs PO x 1 dose, then 1 tab PO QD x 4 days    Dispense:  6 tablet    Refill:  0  . Guaifenesin (MUCINEX MAXIMUM STRENGTH) 1200 MG TB12    Sig: Take 1 tablet (1,200 mg total) by mouth every 12 (twelve) hours as needed.    Dispense:  14 tablet    Refill:  1    Patient verbalized to me that they understand the following: diagnosis, what is being done for them, what to expect and what should be done at home.  Their questions have been answered. They understand that I am unable to predict every possible medication interaction or adverse outcome and that if any unexpected symptoms arise, they should contact us and their pharmacist, as well as never hesitate to seek urgent/emergent care at [facility]gent Car or ER if they think it might be warranted.    Delman Cheadle, MD, MPH [facility] at [facility] [address] [place], [place]  [phone] Office phone  [phone] Office fax   07/13/18 8:24 AM

## 2018-07-13 NOTE — Patient Instructions (Addendum)
You need to schedule your mammogram.  Please call [facility] at [phone] to schedule.  Please have Dr. Senaida Ores send [place] a copy of your eye exam    If you have lab work done today you will be contacted with your lab results within the next 2 weeks.  If you have not heard from [place] then please contact us. The fastest way to get your results is to register for My Chart.   IF you received an x-ray today, you will receive an invoice from [facility]. Please contact [facility] at [phone] with questions or concerns regarding your invoice.   IF you received labwork today, you will receive an invoice from [facility]. Please contact [facility] at [phone] with questions or concerns regarding your invoice.   Our billing staff will not be able to assist you with questions regarding bills from these companies.  You will be contacted with the lab results as soon as they are available. The fastest way to get your results is to activate your My Chart account. Instructions are located on the last page of this paperwork. If you have not heard from [place] regarding the results in 2 weeks, please contact this office.     Acute Bronchitis, Adult Acute bronchitis is sudden (acute) swelling of the air tubes (bronchi) in the lungs. Acute bronchitis causes these tubes to fill with mucus, which can make it hard to breathe. It can also cause coughing or wheezing. In adults, acute bronchitis usually goes away within 2 weeks. A cough caused by bronchitis may last up to 3 weeks. Smoking, allergies, and asthma can make the condition worse. Repeated episodes of bronchitis may cause further lung problems, such as chronic obstructive pulmonary disease (COPD). What are the causes? This condition can be caused by germs and by substances that irritate the lungs, including:  Cold and flu viruses. This condition is most often caused by the same virus that causes a  cold.  Bacteria.  Exposure to tobacco smoke, dust, fumes, and air pollution.  What increases the risk? This condition is more likely to develop in people who:  Have close contact with someone with acute bronchitis.  Are exposed to lung irritants, such as tobacco smoke, dust, fumes, and vapors.  Have a weak immune system.  Have a respiratory condition such as asthma.  What are the signs or symptoms? Symptoms of this condition include:  A cough.  Coughing up clear, yellow, or green mucus.  Wheezing.  Chest congestion.  Shortness of breath.  A fever.  Body aches.  Chills.  A sore throat.  How is this diagnosed? This condition is usually diagnosed with a physical exam. During the exam, your health care provider may order tests, such as chest X-rays, to rule out other conditions. He or she may also:  Test a sample of your mucus for bacterial infection.  Check the level of oxygen in your blood. This is done to check for pneumonia.  Do a chest X-ray or lung function testing to rule out pneumonia and other conditions.  Perform blood tests.  Your health care provider will also ask about your symptoms and medical history. How is this treated? Most cases of acute bronchitis clear up over time without treatment. Your health care provider may recommend:  Drinking more fluids. Drinking more makes your mucus thinner, which may make it easier to breathe.  Taking a medicine for a fever or cough.  Taking an antibiotic medicine.  Using an inhaler  to help improve shortness of breath and to control a cough.  Using a cool mist vaporizer or humidifier to make it easier to breathe.  Follow these instructions at home: Medicines  Take over-the-counter and prescription medicines only as told by your health care provider.  If you were prescribed an antibiotic, take it as told by your health care provider. Do not stop taking the antibiotic even if you start to feel  better. General instructions  Get plenty of rest.  Drink enough fluids to keep your urine clear or pale yellow.  Avoid smoking and secondhand smoke. Exposure to cigarette smoke or irritating chemicals will make bronchitis worse. If you smoke and you need help quitting, ask your health care provider. Quitting smoking will help your lungs heal faster.  Use an inhaler, cool mist vaporizer, or humidifier as told by your health care provider.  Keep all follow-up visits as told by your health care provider. This is important. How is this prevented? To lower your risk of getting this condition again:  Wash your hands often with soap and water. If soap and water are not available, use hand sanitizer.  Avoid contact with people who have cold symptoms.  Try not to touch your hands to your mouth, nose, or eyes.  Make sure to get the flu shot every year.  Contact a health care provider if:  Your symptoms do not improve in 2 weeks of treatment. Get help right away if:  You cough up blood.  You have chest pain.  You have severe shortness of breath.  You become dehydrated.  You faint or keep feeling like you are going to faint.  You keep vomiting.  You have a severe headache.  Your fever or chills gets worse. This information is not intended to replace advice given to you by your health care provider. Make sure you discuss any questions you have with your health care provider. Document Released: 08/25/2004 Document Revised: 02/10/2016 Document Reviewed: 01/06/2016 Elsevier Interactive Patient Education  [employer].

## 2018-07-14 LAB — COMPREHENSIVE METABOLIC PANEL
ALT: 73 IU/L — AB (ref 0–32)
AST: 46 IU/L — AB (ref 0–40)
Albumin/Globulin Ratio: 1.6 (ref 1.2–2.2)
Albumin: 4.1 g/dL (ref 3.5–5.5)
Alkaline Phosphatase: 57 IU/L (ref 39–117)
BUN/Creatinine Ratio: 18 (ref 9–23)
BUN: 14 mg/dL (ref 6–24)
Bilirubin Total: 0.4 mg/dL (ref 0.0–1.2)
CO2: 23 mmol/L (ref 20–29)
Calcium: 9.6 mg/dL (ref 8.7–10.2)
Chloride: 103 mmol/L (ref 96–106)
Creatinine, Ser: 0.78 mg/dL (ref 0.57–1.00)
GFR calc Af Amer: 97 mL/min/{1.73_m2} (ref >59)
GFR, EST NON AFRICAN AMERICAN: 84 mL/min/{1.73_m2} (ref >59)
GLOBULIN, TOTAL: 2.6 g/dL (ref 1.5–4.5)
Glucose: 168 mg/dL — ABNORMAL HIGH (ref 65–99)
Potassium: 4.4 mmol/L (ref 3.5–5.2)
SODIUM: 143 mmol/L (ref 134–144)
Total Protein: 6.7 g/dL (ref 6.0–8.5)

## 2018-07-14 LAB — LIPID PANEL
CHOLESTEROL TOTAL: 139 mg/dL (ref 100–199)
Chol/HDL Ratio: 3.1 ratio (ref 0.0–4.4)
HDL: 45 mg/dL (ref >39)
LDL Calculated: 72 mg/dL (ref 0–99)
TRIGLYCERIDES: 111 mg/dL (ref 0–149)
VLDL CHOLESTEROL CAL: 22 mg/dL (ref 5–40)

## 2018-07-14 LAB — HEMOGLOBIN A1C
ESTIMATED AVERAGE GLUCOSE: 134 mg/dL
Hgb A1c MFr Bld: 6.3 % — ABNORMAL HIGH (ref 4.8–5.6)

## 2018-07-14 LAB — TSH: TSH: 1.16 u[IU]/mL (ref 0.450–4.500)

## 2018-07-24 ENCOUNTER — Encounter: Payer: Self-pay | Admitting: Cardiovascular Disease

## 2018-07-24 ENCOUNTER — Ambulatory Visit: Payer: 59 | Admitting: Cardiovascular Disease

## 2018-07-24 VITALS — BP 97/66 | HR 94 | Ht 59.0 in | Wt 158.0 lb

## 2018-07-24 DIAGNOSIS — I5042 Chronic combined systolic (congestive) and diastolic (congestive) heart failure: Secondary | ICD-10-CM

## 2018-07-24 DIAGNOSIS — E7800 Pure hypercholesterolemia, unspecified: Secondary | ICD-10-CM | POA: Diagnosis not present

## 2018-07-24 DIAGNOSIS — I493 Ventricular premature depolarization: Secondary | ICD-10-CM

## 2018-07-24 MED ORDER — METOPROLOL SUCCINATE ER 25 MG PO TB24: 25.0000 mg | ORAL_TABLET | Freq: Every day | ORAL | 3 refills | Status: DC

## 2018-07-24 NOTE — Patient Instructions (Signed)
Medication Instructions:  Your Physician recommend you continue on your current medication as directed.    If you need a refill on your cardiac medications before your next appointment, please call your pharmacy.   Lab work: None  Testing/Procedures: None  Follow-Up: At [facility], you and your health needs are our priority.  As part of our continuing mission to provide you with exceptional heart care, we have created designated Provider Care Teams.  These Care Teams include your primary Cardiologist (physician) and Advanced Practice Providers (APPs -  Physician Assistants and Nurse Practitioners) who all work together to provide you with the care you need, when you need it. You will need a follow up appointment in 6 months.  Please call our office 2 months in advance to schedule this appointment.  You may see Skeet Latch, MD or one of the following Advanced Practice Providers on your designated Care Team:   Kerin Ransom, PA-C Roby Lofts, [place] . Sande Rives, PA-C

## 2018-07-24 NOTE — Progress Notes (Signed)
Cardiology Office Note   Date:  07/24/2018   ID:  Emily [name], DOB 12-Feb-1961, MRN [id number]  PCP:  [name], MD  Cardiologist:   Skeet Latch, MD   Chief Complaint  Patient presents with  . Follow-up    Patient ID: Emily [name] is a 57 y.o. female with chronic systolic and diastolic heart diabetes, hyperlipidemia and family history of premature CAD here for follow up.  Ms. Rohrig was initially seen 05/2015 for chest pain, shortness of breath and palpitations.  She had an ETT on 05/15/15 that was negative for ischemia.  Echo showed LVEF 55-60% with probably hypokinesis of the basal-mid inferolateral and inferior myocardium and grade 1 diastolic dysfunction. She subsequently underwent LHC, which showed no CAD.  Ms. Malcolm also wore a 48 hour Holter that showed frequent PVCs and occasional PACs.  She was started on metoprolol but reported feeling depressed at her follow up appointment.  Metoprolol was discontinued and she was started on propafenone.  This has controlled her PVCs well.  Since her last appointment Ms. Ivins was admitted 06/2018 with acute systolic heart failure.  She was found to have a nonischemic cardiomyopathy with LVEF 25% and global LV dysfunction.  She also had moderate to severe mitral regurgitation.  Cardiac MRI was negative for myocarditis and had no evidence of delayed enhancement.  She had previously been on propafenone for symptomatic PVCs she previously did not tolerate metoprolol.  This was discontinued, as it is associated with reduced systolic function.  She was diuresed with IV Lasix.  She followed up with Kerin Ransom, PA-C and was doing well.  She had a cough so lisinopril was switched to [employer].  Metoprolol was consolidated to succinate.  Since then she has been feeling well.  She is feeling stronger each day.  She did struggle with an upper respiratory infection and was treated with azithromycin.  She has not experienced any lower extremity edema.  Her  weight has been stable and decreasing.  She denies orthopnea or PND.  She has not experienced any palpitations.  She does get mildly lightheaded with exertion and notes that she is still more tired than in the past.   Past Medical History:  Diagnosis Date  . Acute combined systolic and diastolic heart failure ([place]) 06/16/2018  . Allergy   . CHF (congestive heart failure) ([place])   . GERD (gastroesophageal reflux disease)   . NICM (nonischemic cardiomyopathy) ([place]) 06/21/2018  . NSVT (nonsustained ventricular tachycardia) ([place]) 06/13/2015  . PVC's (premature ventricular contractions) 06/11/2015  . S/P cardiac cath, 06/20/18 06/21/2018  . Type 2 diabetes mellitus ([place])     Past Surgical History:  Procedure Laterality Date  . CARDIAC CATHETERIZATION N/A 06/19/2015   Procedure: Left Heart Cath and Coronary Angiography;  Surgeon: Leonie Man, MD;  Location: [facility];  Service: Cardiovascular;  Laterality: N/A;  . RIGHT/LEFT HEART CATH AND CORONARY ANGIOGRAPHY N/A 06/19/2018   Procedure: RIGHT/LEFT HEART CATH AND CORONARY ANGIOGRAPHY;  Surgeon: Troy Sine, MD;  Location: [facility];  Service: Cardiovascular;  Laterality: N/A;  . TEE WITHOUT CARDIOVERSION N/A 06/20/2018   Procedure: TRANSESOPHAGEAL ECHOCARDIOGRAM (TEE);  Surgeon: Larey Dresser, MD;  Location: [facility];  Service: Cardiovascular;  Laterality: N/A;  . TONSILLECTOMY     when she was 3     Current Outpatient Medications  Medication Sig Dispense Refill  . acetaminophen (TYLENOL) 500 MG tablet Take 500 mg by mouth every 6 (six)  hours as needed for moderate pain.    Marland Kitchen aspirin 81 MG tablet Take 81 mg by mouth daily.    Marland Kitchen atorvastatin (LIPITOR) 20 MG tablet Take 1 tablet (20 mg total) by mouth daily at 6 PM. 30 tablet 6  . azithromycin (ZITHROMAX) 250 MG tablet Take 2 tabs PO x 1 dose, then 1 tab PO QD x 4 days 6 tablet 0  . benzonatate (TESSALON) 200 MG capsule Take 1 capsule (200 mg total) by mouth  3 (three) times daily as needed for cough. 40 capsule 0  . Blood Glucose Monitoring Suppl (ONE TOUCH ULTRA 2) w/Device KIT USE TO TEST UP TO 4 TIMES PER DAY. 1 each 1  . cholecalciferol (VITAMIN D) 1000 UNITS tablet Take 1,000 Units by mouth daily.    . famotidine (PEPCID) 20 MG tablet Take 1 tablet (20 mg total) by mouth daily. 30 tablet 6  . furosemide (LASIX) 40 MG tablet Take 1 tablet (40 mg total) by mouth daily. 30 tablet 6  . glucose blood (ONE TOUCH ULTRA TEST) test strip USE TO TEST UP TO 4 TIMES A DAY. 100 each 11  . Guaifenesin (MUCINEX MAXIMUM STRENGTH) 1200 MG TB12 Take 1 tablet (1,200 mg total) by mouth every 12 (twelve) hours as needed. 14 tablet 1  . ibuprofen (ADVIL,MOTRIN) 200 MG tablet Take 200 mg by mouth every 6 (six) hours as needed for mild pain. Reported on 10/26/2015    . metFORMIN (GLUCOPHAGE) 850 MG tablet TAKE 1 TABLET BY MOUTH TWICE A DAY WITH A MEAL 180 tablet 1  . metoprolol succinate (TOPROL-XL) 25 MG 24 hr tablet Take 1 tablet (25 mg total) by mouth daily. Take with or immediately following a meal. 90 tablet 3  . Multiple Vitamins-Minerals (MULTIVITAMIN WITH MINERALS) tablet Take 1 tablet by mouth daily.    . nitroGLYCERIN (NITROSTAT) 0.4 MG SL tablet Place 1 tablet (0.4 mg total) under the tongue every 5 (five) minutes as needed for chest pain. 25 tablet 1  . sacubitril-valsartan (ENTRESTO) 24-26 MG Take 1 tablet by mouth 2 (two) times daily. 60 tablet 6  . thiamine (VITAMIN B-1) 100 MG tablet Take 100 mg by mouth daily.     No current facility-administered medications for this visit.     Allergies:   Hornet venom; Metoprolol; and Tetanus toxoids    Social History:  The patient  reports that she has never smoked. She has never used smokeless tobacco. She reports that she does not drink alcohol or use drugs.   Family History:  [name] family history includes COPD in her father and mother; Cancer in her mother and paternal grandmother; Diabetes in her  father and sister; Emphysema in her mother; [name]' disease in her sister; Heart disease in her brother, father, mother, and sister; Heart failure (age of onset: 64) in her sister; Hyperlipidemia in her brother, father, and sister; Hypertension in her father; Prostate cancer in her paternal grandfather; Skin cancer in her father; Vision loss in her father.    ROS:  Please see the history of present illness.   Otherwise, review of systems are positive for neck stiffness, fatigue.   All other systems are reviewed and negative. Problem Dr. Orene Desanctis  PHYSICAL EXAM: VS:  BP 97/66   Pulse 94   Ht '4\' 11"'  (1.499 m)   Wt 158 lb (71.7 kg)   LMP 10/14/2014   BMI 31.91 kg/m  , BMI Body mass index is 31.91 kg/m. GENERAL:  Well appearing.  No  acute distress.  HEENT: Pupils equal round and reactive, fundi not visualized, oral mucosa unremarkable NECK:  No jugular venous distention, waveform within normal limits, carotid upstroke brisk and symmetric, no bruits, no thyromegaly LUNGS:  Clear to auscultation bilaterally HEART:  RRR.  PMI not displaced or sustained,S1 and S2 within normal limits, no S3, no S4, no clicks, no rubs, no murmurs ABD:  Flat, positive bowel sounds normal in frequency in pitch, no bruits, no rebound, no guarding, no midline pulsatile mass, no hepatomegaly, no splenomegaly EXT:  2 plus pulses throughout, no edema, no cyanosis no clubbing SKIN:  No rashes no nodules NEURO:  Cranial nerves II through XII grossly intact, motor grossly intact throughout PSYCH:  Cognitively intact, oriented to person place and time   EKG:  EKG is not ordered today. The ekg ordered 07/11/16 demonstrates sinus rhythm at 94 bpm. 01/09/17: Sinus rhythm. Rate 91 bpm. 07/20/17: Sinus 97 bpm.  Low QRS  Echo 05/27/15: Study Conclusions  - Left ventricle: The cavity size was mildly dilated. Systolic  function was normal. The estimated ejection fraction was in the  range of 55% to 60%. Probable hypokinesis  of the  basal-midinferolateral and inferior myocardium. There was an  increased relative contribution of atrial contraction to  ventricular filling. Doppler parameters are consistent with  abnormal left ventricular relaxation (grade 1 diastolic  dysfunction). - Aortic valve: Trileaflet; normal thickness, mildly calcified  leaflets. - Mitral valve: There was mild regurgitation.  ETT 05/15/15: Negative for ischemia.  LHC 06/19/15: 1. Angiographically normal coronary arteries 2. There is possible mild left ventricular systolic dysfunction - difficult to assess due to ectopy  48 hour Holter 05/04/15:  Quality: Fair. Baseline artifact. Predominant rhythm: sinus Average heart rate: 92 bpm Max heart rate: 141 bpm Min heart rate: 59 bpm Pauses >2.5 seconds: 0 Ventricular ectopics: 1651 (1616 isolated, 4 couplets, 5 runs of NSVT, longest run 6 beats)  Percentage of ectopic beats: 0.65% Morphology: polymorphic Supraventricular ectopics: 18 Patient did not submit a symptom diary but triggered the monitor for one event, at which time sinus rhythm at 79 bpm was recorded.   Recent Labs: 06/16/2018: B Natriuretic Peptide 417.2 06/20/2018: Platelets 279 07/11/2018: Magnesium 1.7 07/13/2018: ALT 73; BUN 14; Creatinine, Ser 0.78; Hemoglobin 14.1; Potassium 4.4; Sodium 143; TSH 1.160    Lipid Panel    Component Value Date/Time   CHOL 139 07/13/2018 1024   TRIG 111 07/13/2018 1024   HDL 45 07/13/2018 1024   CHOLHDL 3.1 07/13/2018 1024   CHOLHDL 4.6 06/16/2016 1338   VLDL 34 (H) 06/16/2016 1338   LDLCALC 72 07/13/2018 1024   LDLDIRECT 129 (H) 07/27/2012 0809      Wt Readings from Last 3 Encounters:  07/24/18 158 lb (71.7 kg)  07/13/18 162 lb (73.5 kg)  07/11/18 163 lb (73.9 kg)      Other studies Reviewed: Additional studies/ records that were reviewed today include: . Review of the above records demonstrates:  Please see elsewhere in the note.     ASSESSMENT  AND PLAN:  # Chronic systolic and diastolic heart failure: LVEF 20%.  Likely due to either propafenone or PVCs.  She also has a family history of heart failure in her mother and sister.  Continue metoprolol and Entresto.  Repeat echo planned for 09/2018.  She will be seen in the heart failure clinic later that day.  Given her low BP we will not add spironolactone.   # Hyperlipidemia: Continue atorvastatin.  LDL 72 07/2018.   #  Obesity: Ms. Bringle was encouraged to keep working on diet and exercise.   # PVCs: Heart catheterization was negative for ischemia and LVEF is >55%.  Continue metoprolol.   Current medicines are reviewed at length with the patient today.  The patient does not have concerns regarding medicines.  The following changes have been made: none  Labs/ tests ordered today include:   No orders of the defined types were placed in this encounter.     Disposition:   FU with Kelie Gainey C. Oval Linsey, MD in 5 months.   Signed, Skeet Latch, MD  07/24/2018 8:38 AM    [employer]

## 2018-07-31 ENCOUNTER — Other Ambulatory Visit: Payer: Self-pay | Admitting: Family Medicine

## 2018-07-31 DIAGNOSIS — Z1231 Encounter for screening mammogram for malignant neoplasm of breast: Secondary | ICD-10-CM

## 2018-08-21 NOTE — Progress Notes (Signed)
Advanced Heart Failure Clinic Note   Referring Physician: PCP: [name], MD PCP-Cardiologist: Skeet Latch, MD   HPI:  Emily [name] is a 58 y.o. 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    Admitted 11/16 - 06/21/18 with Acute systolic CHF. Noted by be NICM by cMRI and cath. At least moderate, functional MR noted. Diuresed with IV lasix and meds adjusted as tolerated.  Seen in [facility] 07/05/18 and was doing well. Change to low dose Entresto with anticipation of close HF clinic follow up. She remains on Lopressor.   She presents today for regular follow up. She is feeling overall well. Had a recent URI and is still getting over. She denies SOB with ADLS usually, but occasional has SOB with showering if she has to wash/brush her hair. She denies lightheadedness or dizziness. She is tolerating Entresto very well. She felt an improvement of her symptoms within several weeks of starting. She denies peripheral edema. Her mood is somewhat depressed, but her dad is in the hospital currently with kidney stones requiring a procedure, so she has several extenuating circumstances. She is taking all medications as directed. She continues to show great insight into her disease, and a good understanding of her condition and medications.   TEE 06/20/18 LVEF 25-30%, Normal RV size and mildly decreased function, Mod LAE, Small PFO, Mild TR, Trivial PI, Moderate/Severe MR but looked more severe visually. MR is central suggestive of functional MR  cMRI 06/20/18 1.  Mildly dilated LV with EF 28%, diffuse hypokinesis. 2.  Normal RV size with mildly decreased systolic function, EF 65%. 3. No myocardial LGE, so no definitive evidence for prior myocarditis, infiltrative disease, or MI. 4. At least moderate mitral regurgitation. This was evaluated by TEE today.  [facility] 06/19/18 Normal coronary arteries Hemodynamics (mmHg) RA mean 6 RV 41/8 PA 42/20 (31) PCWP  25 AO 96/58 Cardiac Output (Fick) 3.1 Cardiac Index (Fick) 1.8 SVR: 18.9 PVR 1.7 WU  Review of systems complete and found to be negative unless listed in HPI.    Past Medical History 1. Chronic systolic CHF-> NICM by cath 06/2018. cMRI showed EF 28%, mildly decreased RV size, and no myocardial LGE. Has at least moderate, functional MR by TEE. Plan to aggressively titrate meds and re-assess. 2. Mod/Severe MR: Suspect functional by TEE 06/20/18 - Plan to aggressively treat her cardiomyopathy and follow up. 3. DM2 4. HLD  Past Medical History:  Diagnosis Date  . Acute combined systolic and diastolic heart failure ([place]) 06/16/2018  . Allergy   . CHF (congestive heart failure) ([place])   . GERD (gastroesophageal reflux disease)   . NICM (nonischemic cardiomyopathy) ([place]) 06/21/2018  . NSVT (nonsustained ventricular tachycardia) ([place]) 06/13/2015  . PVC's (premature ventricular contractions) 06/11/2015  . S/P cardiac cath, 06/20/18 06/21/2018  . Type 2 diabetes mellitus ([place])    Current Outpatient Medications  Medication Sig Dispense Refill  . acetaminophen (TYLENOL) 500 MG tablet Take 500 mg by mouth every 6 (six) hours as needed for moderate pain.    Marland Kitchen aspirin 81 MG tablet Take 81 mg by mouth daily.    Marland Kitchen atorvastatin (LIPITOR) 20 MG tablet Take 1 tablet (20 mg total) by mouth daily at 6 PM. 30 tablet 6  . Blood Glucose Monitoring Suppl (ONE TOUCH ULTRA 2) w/Device KIT USE TO TEST UP TO 4 TIMES PER DAY. 1 each 1  . cholecalciferol (VITAMIN D) 1000 UNITS tablet Take 1,000 Units by mouth  daily.    . famotidine (PEPCID) 20 MG tablet Take 1 tablet (20 mg total) by mouth daily. 30 tablet 6  . furosemide (LASIX) 40 MG tablet Take 1 tablet (40 mg total) by mouth daily. 30 tablet 6  . glucose blood (ONE TOUCH ULTRA TEST) test strip USE TO TEST UP TO 4 TIMES A DAY. 100 each 11  . Guaifenesin (MUCINEX MAXIMUM STRENGTH) 1200 MG TB12 Take 1 tablet (1,200 mg total) by mouth every 12 (twelve) hours as  needed. 14 tablet 1  . ibuprofen (ADVIL,MOTRIN) 200 MG tablet Take 200 mg by mouth every 6 (six) hours as needed for mild pain. Reported on 10/26/2015    . metFORMIN (GLUCOPHAGE) 850 MG tablet TAKE 1 TABLET BY MOUTH TWICE A DAY WITH A MEAL 180 tablet 1  . metoprolol succinate (TOPROL-XL) 25 MG 24 hr tablet Take 1 tablet (25 mg total) by mouth daily. Take with or immediately following a meal. 90 tablet 3  . Multiple Vitamins-Minerals (MULTIVITAMIN WITH MINERALS) tablet Take 1 tablet by mouth daily.    . sacubitril-valsartan (ENTRESTO) 24-26 MG Take 1 tablet by mouth 2 (two) times daily. 60 tablet 6  . thiamine (VITAMIN B-1) 100 MG tablet Take 100 mg by mouth daily.    . nitroGLYCERIN (NITROSTAT) 0.4 MG SL tablet Place 1 tablet (0.4 mg total) under the tongue every 5 (five) minutes as needed for chest pain. (Patient not taking: Reported on 08/22/2018) 25 tablet 1   No current facility-administered medications for this encounter.    Allergies  Allergen Reactions  . Hornet Venom Swelling  . Metoprolol     depression  . Tetanus Toxoids Other (See Comments)    Big red rash and swelling in the muscle   Social History   Socioeconomic History  . Marital status: Married    Spouse name: Not on file  . Number of children: 5  . Years of education: Not on file  . Highest education level: Master's degree (e.g., MA, MS, MEng, MEd, MSW, MBA)  Occupational History  . 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 Financial resource strain: Not very hard  . Food insecurity:    Worry: Never true    Inability: Never true  . Transportation needs:    Medical: No    Non-medical: No  Tobacco Use  . Smoking status: Never Smoker  . Smokeless tobacco: Never Used  Substance and Sexual Activity  . Alcohol use: No  . Drug use: No  . Sexual activity: Yes  Lifestyle  . Physical activity:    Days per week: Not on file    Minutes per session: Not on file  . Stress: Not on file  Relationships  . Social connections:     Talks on phone: Not on file    Gets together: Not on file    Attends religious service: Not on file    Active member of club or organization: Not on file    Attends meetings of clubs or organizations: Not on file    Relationship status: Not on file  . Intimate partner violence:    Fear of current or ex partner: Not on file    Emotionally abused: Not on file    Physically abused: Not on file    Forced sexual activity: Not on file  Other Topics Concern  . Not on file  Social History Narrative   Married.  Education: [employer].          Family History  Problem Relation  Age of Onset  . Heart disease Mother   . COPD Mother   . Emphysema Mother   . Cancer Mother        oral  . Heart disease Father   . Diabetes Father   . COPD Father   . Vision loss Father   . Skin cancer Father        skin  . Hyperlipidemia Father   . Hypertension Father   . Heart disease Sister   . Graves' disease Sister   . Diabetes Sister   . Hyperlipidemia Sister   . Heart failure Sister 31  . Heart disease Brother   . Hyperlipidemia Brother   . Cancer Paternal Grandmother        unknown  . Prostate cancer Paternal Grandfather        Prostate cancer   Vitals:   08/22/18 0920  BP: 108/62  Pulse: (!) 116  SpO2: 96%  Weight: 71.5 kg (157 lb 9.6 oz)   Wt Readings from Last 3 Encounters:  08/22/18 71.5 kg (157 lb 9.6 oz)  07/24/18 71.7 kg (158 lb)  07/13/18 73.5 kg (162 lb)    PHYSICAL EXAM: General: Well appearing. No resp difficulty. HEENT: Normal Neck: Supple. JVP 5-6. Carotids 2+ bilat; no bruits. No thyromegaly or nodule noted. Cor: PMI nondisplaced. RRR, No M/G/R noted Lungs: CTAB, normal effort. Abdomen: Soft, non-tender, non-distended, no HSM. No bruits or masses. +BS  Extremities: No cyanosis, clubbing, or rash. R and LLE no edema.  Neuro: Alert & orientedx3, cranial nerves grossly intact. moves all 4 extremities w/o difficulty. Affect pleasant   ASSESSMENT & PLAN:  1. Chronic  systolic CHF-> NICM by cath 06/2018. cMRI showed EF 28%, mildly decreased RV size, and no myocardial LGE. Has at least moderate, functional MR by TEE. Plan to aggressively titrate meds and re-assess. - NYHA II-III symptoms. - Volume status stable on exam.     - Continue lasix 40 mg daily - Continue Toprol Xl 25 mg daily  - Continue Entresto 24/26 mg BID. No room to increase - Will cautiously add low dose spiro 12.5 mg qhs. BMET today and 10 days.  - Reinforced fluid restriction to < 2 L daily, sodium restriction to less than 2000 mg daily, and the importance of daily weights.   2. Mod/Severe MR: Suspect functional by TEE 06/20/18 - Plan to aggressively treat her cardiomyopathy and follow up as above. Not heard on exam today.  3. DM2 - Per PCP. Should consider Farxiga/Jardiance.  4. HLD - Per [facility].  5. Anxiety - Occasional chest pressure in high stress situations. Normal cors cath 06/2018  Keep appt with Dr. Aundra Dubin with Echo in March. Meds and labs as above. Doing well overall.   Shirley Friar, PA-C 08/22/18   Greater than 50% of the 25 minute visit was spent in counseling/coordination of care regarding disease state education, salt/fluid restriction, sliding scale diuretics, and medication compliance.

## 2018-08-22 ENCOUNTER — Encounter (HOSPITAL_COMMUNITY): Payer: Self-pay

## 2018-08-22 ENCOUNTER — Telehealth (HOSPITAL_COMMUNITY): Payer: Self-pay | Admitting: Cardiology

## 2018-08-22 ENCOUNTER — Other Ambulatory Visit: Payer: Self-pay

## 2018-08-22 ENCOUNTER — Ambulatory Visit (HOSPITAL_COMMUNITY)
Admission: RE | Admit: 2018-08-22 | Discharge: 2018-08-22 | Disposition: A | Discharge status: Home / Self Care | Payer: 59 | Source: Ambulatory Visit | Attending: Cardiology | Admitting: Cardiology

## 2018-08-22 VITALS — BP 108/62 | HR 116 | Wt 157.6 lb

## 2018-08-22 DIAGNOSIS — I428 Other cardiomyopathies: Secondary | ICD-10-CM | POA: Insufficient documentation

## 2018-08-22 DIAGNOSIS — E119 Type 2 diabetes mellitus without complications: Secondary | ICD-10-CM | POA: Diagnosis not present

## 2018-08-22 DIAGNOSIS — Z8249 Family history of ischemic heart disease and other diseases of the circulatory system: Secondary | ICD-10-CM | POA: Insufficient documentation

## 2018-08-22 DIAGNOSIS — Z7984 Long term (current) use of oral hypoglycemic drugs: Secondary | ICD-10-CM | POA: Insufficient documentation

## 2018-08-22 DIAGNOSIS — I5032 Chronic diastolic (congestive) heart failure: Secondary | ICD-10-CM | POA: Diagnosis not present

## 2018-08-22 DIAGNOSIS — Z79899 Other long term (current) drug therapy: Secondary | ICD-10-CM | POA: Insufficient documentation

## 2018-08-22 DIAGNOSIS — I340 Nonrheumatic mitral (valve) insufficiency: Secondary | ICD-10-CM | POA: Insufficient documentation

## 2018-08-22 DIAGNOSIS — E785 Hyperlipidemia, unspecified: Secondary | ICD-10-CM | POA: Insufficient documentation

## 2018-08-22 DIAGNOSIS — I5022 Chronic systolic (congestive) heart failure: Secondary | ICD-10-CM | POA: Diagnosis not present

## 2018-08-22 DIAGNOSIS — Z833 Family history of diabetes mellitus: Secondary | ICD-10-CM | POA: Insufficient documentation

## 2018-08-22 DIAGNOSIS — Z91048 Other nonmedicinal substance allergy status: Secondary | ICD-10-CM | POA: Diagnosis not present

## 2018-08-22 DIAGNOSIS — Z888 Allergy status to other drugs, medicaments and biological substances status: Secondary | ICD-10-CM | POA: Diagnosis not present

## 2018-08-22 DIAGNOSIS — Z7982 Long term (current) use of aspirin: Secondary | ICD-10-CM | POA: Diagnosis not present

## 2018-08-22 DIAGNOSIS — IMO0001 Reserved for inherently not codable concepts without codable children: Secondary | ICD-10-CM

## 2018-08-22 DIAGNOSIS — Z0389 Encounter for observation for other suspected diseases and conditions ruled out: Secondary | ICD-10-CM | POA: Diagnosis not present

## 2018-08-22 DIAGNOSIS — I472 Ventricular tachycardia: Secondary | ICD-10-CM | POA: Insufficient documentation

## 2018-08-22 DIAGNOSIS — F419 Anxiety disorder, unspecified: Secondary | ICD-10-CM | POA: Diagnosis not present

## 2018-08-22 DIAGNOSIS — Z887 Allergy status to serum and vaccine status: Secondary | ICD-10-CM | POA: Diagnosis not present

## 2018-08-22 LAB — BASIC METABOLIC PANEL
Anion gap: 13 (ref 5–15)
BUN: 17 mg/dL (ref 6–20)
CALCIUM: 9.4 mg/dL (ref 8.9–10.3)
CO2: 28 mmol/L (ref 22–32)
CREATININE: 0.9 mg/dL (ref 0.44–1.00)
Chloride: 102 mmol/L (ref 98–111)
GFR calc non Af Amer: >60 mL/min (ref >60)
GLUCOSE: 178 mg/dL — AB (ref 70–99)
Potassium: 3 mmol/L — ABNORMAL LOW (ref 3.5–5.1)
Sodium: 143 mmol/L (ref 135–145)

## 2018-08-22 MED ORDER — POTASSIUM CHLORIDE CRYS ER 20 MEQ PO TBCR: EXTENDED_RELEASE_TABLET | ORAL | 3 refills | Status: DC

## 2018-08-22 MED ORDER — SPIRONOLACTONE 25 MG PO TABS: 25.0000 mg | ORAL_TABLET | Freq: Every day | ORAL | 3 refills | Status: DC

## 2018-08-22 NOTE — Telephone Encounter (Signed)
Notes recorded by Kerry Dory, CMA on 08/22/2018 at 1:56 PM EST Patient aware. Patient voiced understanding, repeat labs 1/31   ------  Notes recorded by Shirley Friar, PA-C on 08/22/2018 at 1:18 PM EST Potassium is low, but we started her on spironolactone today. Needs to start on K as well, for now, but may be able to come off as we titrate her spiro.   Would have her take 60 meq (three tabs) of K today, and then start 20 meq daily.  Should be set up for repeat labs already with start of Arlyce Harman.    Legrand Como [address] [place], PA-C 08/22/2018 1:18 PM

## 2018-08-22 NOTE — Patient Instructions (Signed)
START Spironolactone 12.5 mg, one half tab daily at bedtime  Labs today We will only contact you if something comes back abnormal or we need to make some changes. Otherwise no news is good news!   Labs needed in 10 days    Keep follow up as scheduled   Do the following things EVERYDAY: 1) Weigh yourself in the morning before breakfast. Write it down and keep it in a log. 2) Take your medicines as prescribed 3) Eat low salt foods-Limit salt (sodium) to 2000 mg per day.  4) Stay as active as you can everyday 5) Limit all fluids for the day to less than 2 liters

## 2018-08-22 NOTE — Telephone Encounter (Signed)
-----   Message from Shirley Friar, PA-C sent at 08/22/2018  1:18 PM EST ----- Potassium is low, but we started her on spironolactone today.  Needs to start on K as well, for now, but may be able to come off as we titrate her spiro.   Would have her take 60 meq (three tabs) of K today, and then start 20 meq daily.   Should be set up for repeat labs already with start of Arlyce Harman.    Legrand Como [address]" [place], PA-C 08/22/2018 1:18 PM

## 2018-08-29 ENCOUNTER — Ambulatory Visit (HOSPITAL_COMMUNITY)
Admission: RE | Admit: 2018-08-29 | Discharge: 2018-08-29 | Disposition: A | Discharge status: Home / Self Care | Payer: 59 | Source: Ambulatory Visit | Attending: Internal Medicine | Admitting: Internal Medicine

## 2018-08-29 DIAGNOSIS — I5032 Chronic diastolic (congestive) heart failure: Secondary | ICD-10-CM

## 2018-08-29 LAB — BASIC METABOLIC PANEL
ANION GAP: 14 (ref 5–15)
BUN: 23 mg/dL — ABNORMAL HIGH (ref 6–20)
CHLORIDE: 99 mmol/L (ref 98–111)
CO2: 25 mmol/L (ref 22–32)
CREATININE: 0.97 mg/dL (ref 0.44–1.00)
Calcium: 9.7 mg/dL (ref 8.9–10.3)
GFR calc non Af Amer: >60 mL/min (ref >60)
Glucose, Bld: 217 mg/dL — ABNORMAL HIGH (ref 70–99)
POTASSIUM: 3.9 mmol/L (ref 3.5–5.1)
SODIUM: 138 mmol/L (ref 135–145)

## 2018-08-31 ENCOUNTER — Other Ambulatory Visit (HOSPITAL_COMMUNITY): Payer: 59

## 2018-09-03 ENCOUNTER — Other Ambulatory Visit: Payer: Self-pay

## 2018-09-03 ENCOUNTER — Other Ambulatory Visit: Payer: Self-pay | Admitting: *Deleted

## 2018-09-03 MED ORDER — SACUBITRIL-VALSARTAN 24-26 MG PO TABS: 1.0000 | ORAL_TABLET | Freq: Two times a day (BID) | ORAL | 2 refills | Status: DC

## 2018-09-03 MED ORDER — ATORVASTATIN CALCIUM 20 MG PO TABS: 20.0000 mg | ORAL_TABLET | Freq: Every day | ORAL | 2 refills | Status: DC

## 2018-09-03 MED ORDER — FUROSEMIDE 40 MG PO TABS: 40.0000 mg | ORAL_TABLET | Freq: Every day | ORAL | 2 refills | Status: DC

## 2018-09-03 NOTE — Telephone Encounter (Signed)
Rx has been sent to the pharmacy electronically. ° °

## 2018-09-04 ENCOUNTER — Other Ambulatory Visit (HOSPITAL_COMMUNITY): Payer: Self-pay

## 2018-09-04 MED ORDER — METOPROLOL SUCCINATE ER 25 MG PO TB24: 25.0000 mg | ORAL_TABLET | Freq: Every day | ORAL | 3 refills | Status: DC

## 2018-09-04 MED ORDER — SPIRONOLACTONE 25 MG PO TABS: 25.0000 mg | ORAL_TABLET | Freq: Every day | ORAL | 3 refills | Status: DC

## 2018-09-10 ENCOUNTER — Other Ambulatory Visit: Payer: Self-pay

## 2018-09-10 MED ORDER — SACUBITRIL-VALSARTAN 24-26 MG PO TABS: 1.0000 | ORAL_TABLET | Freq: Two times a day (BID) | ORAL | 2 refills | Status: DC

## 2018-09-11 ENCOUNTER — Ambulatory Visit
Admission: RE | Admit: 2018-09-11 | Discharge: 2018-09-11 | Disposition: A | Discharge status: Home / Self Care | Payer: 59 | Source: Ambulatory Visit | Attending: Family Medicine | Admitting: Family Medicine

## 2018-09-11 DIAGNOSIS — Z1231 Encounter for screening mammogram for malignant neoplasm of breast: Secondary | ICD-10-CM

## 2018-09-14 ENCOUNTER — Telehealth: Payer: Self-pay | Admitting: Cardiovascular Disease

## 2018-09-14 ENCOUNTER — Other Ambulatory Visit: Payer: Self-pay | Admitting: *Deleted

## 2018-09-14 NOTE — Telephone Encounter (Signed)
[facility] that Pravastatin 10mg  daily has been discontinued and that the patients medication therapy was changed to Atorvastatin 20mg  daily.  They have updated the patients account.

## 2018-09-14 NOTE — Telephone Encounter (Signed)
New Message          *STAT* If patient is at the pharmacy, call can be transferred to refill team.   1. Which medications need to be refilled? (please list name of each medication and dose if known) Pravastin 10 mg  2. Which pharmacy/location (including street and city if local pharmacy) is medication to be sent to? [facility]   3. Do they need a 30 day or 90 day supply? [place]

## 2018-09-28 ENCOUNTER — Telehealth: Payer: Self-pay | Admitting: Family Medicine

## 2018-09-28 NOTE — Telephone Encounter (Signed)
Called pt. and informed them of Dr. Manya Silvas permanent absence and cancelation of their appt with her. Gave Dr. Manya Silvas new practice and Primary Care at [facility].

## 2018-10-05 ENCOUNTER — Ambulatory Visit: Payer: 59 | Admitting: Family Medicine

## 2018-10-10 ENCOUNTER — Ambulatory Visit (HOSPITAL_COMMUNITY)
Admission: RE | Admit: 2018-10-10 | Discharge: 2018-10-10 | Disposition: A | Discharge status: Home / Self Care | Payer: 59 | Source: Ambulatory Visit | Attending: Family Medicine | Admitting: Family Medicine

## 2018-10-10 ENCOUNTER — Encounter (HOSPITAL_COMMUNITY): Payer: Self-pay | Admitting: Cardiology

## 2018-10-10 ENCOUNTER — Ambulatory Visit (HOSPITAL_BASED_OUTPATIENT_CLINIC_OR_DEPARTMENT_OTHER)
Admission: RE | Admit: 2018-10-10 | Discharge: 2018-10-10 | Disposition: A | Discharge status: Home / Self Care | Payer: 59 | Source: Ambulatory Visit | Attending: Cardiology | Admitting: Cardiology

## 2018-10-10 ENCOUNTER — Other Ambulatory Visit: Payer: Self-pay

## 2018-10-10 VITALS — BP 120/72 | HR 100 | Wt 154.8 lb

## 2018-10-10 DIAGNOSIS — I080 Rheumatic disorders of both mitral and aortic valves: Secondary | ICD-10-CM | POA: Insufficient documentation

## 2018-10-10 DIAGNOSIS — I5022 Chronic systolic (congestive) heart failure: Secondary | ICD-10-CM

## 2018-10-10 DIAGNOSIS — I5032 Chronic diastolic (congestive) heart failure: Secondary | ICD-10-CM | POA: Diagnosis not present

## 2018-10-10 DIAGNOSIS — Z8249 Family history of ischemic heart disease and other diseases of the circulatory system: Secondary | ICD-10-CM | POA: Diagnosis not present

## 2018-10-10 DIAGNOSIS — Z7984 Long term (current) use of oral hypoglycemic drugs: Secondary | ICD-10-CM | POA: Insufficient documentation

## 2018-10-10 DIAGNOSIS — Z7982 Long term (current) use of aspirin: Secondary | ICD-10-CM | POA: Insufficient documentation

## 2018-10-10 DIAGNOSIS — Z79899 Other long term (current) drug therapy: Secondary | ICD-10-CM | POA: Diagnosis not present

## 2018-10-10 DIAGNOSIS — I428 Other cardiomyopathies: Secondary | ICD-10-CM | POA: Insufficient documentation

## 2018-10-10 DIAGNOSIS — K219 Gastro-esophageal reflux disease without esophagitis: Secondary | ICD-10-CM | POA: Insufficient documentation

## 2018-10-10 DIAGNOSIS — I5023 Acute on chronic systolic (congestive) heart failure: Secondary | ICD-10-CM | POA: Insufficient documentation

## 2018-10-10 DIAGNOSIS — E119 Type 2 diabetes mellitus without complications: Secondary | ICD-10-CM | POA: Insufficient documentation

## 2018-10-10 DIAGNOSIS — I340 Nonrheumatic mitral (valve) insufficiency: Secondary | ICD-10-CM

## 2018-10-10 DIAGNOSIS — E785 Hyperlipidemia, unspecified: Secondary | ICD-10-CM | POA: Diagnosis not present

## 2018-10-10 LAB — BASIC METABOLIC PANEL
ANION GAP: 11 (ref 5–15)
BUN: 21 mg/dL — ABNORMAL HIGH (ref 6–20)
CHLORIDE: 100 mmol/L (ref 98–111)
CO2: 27 mmol/L (ref 22–32)
Calcium: 9.9 mg/dL (ref 8.9–10.3)
Creatinine, Ser: 0.89 mg/dL (ref 0.44–1.00)
GFR calc Af Amer: >60 mL/min (ref >60)
GFR calc non Af Amer: >60 mL/min (ref >60)
Glucose, Bld: 134 mg/dL — ABNORMAL HIGH (ref 70–99)
Potassium: 4 mmol/L (ref 3.5–5.1)
Sodium: 138 mmol/L (ref 135–145)

## 2018-10-10 MED ORDER — SPIRONOLACTONE 25 MG PO TABS: 25.0000 mg | ORAL_TABLET | Freq: Every day | ORAL | 3 refills | Status: DC

## 2018-10-10 MED ORDER — METOPROLOL SUCCINATE ER 25 MG PO TB24: 25.0000 mg | ORAL_TABLET | Freq: Two times a day (BID) | ORAL | 3 refills | Status: DC

## 2018-10-10 NOTE — Progress Notes (Signed)
  Echocardiogram 2D Echocardiogram has been performed.  Emily [name] 10/10/2018, 9:51 AM

## 2018-10-10 NOTE — Patient Instructions (Signed)
Lab work done today. We will notify you of any abnormal lab work.  Lab work will need to be done again in 10 days  INCREASE Torpolol 25mg  (1 tab) twice daily  INCREASE Spironolactone 25mg  (1 tab) daily  Please follow up with our heart failure pharmacist in 3 weeks.  Your physician has requested that you have an echocardiogram. Echocardiography is a painless test that uses sound waves to create images of your heart. It provides your doctor with information about the size and shape of your heart and how well your heart's chambers and valves are working. This procedure takes approximately one hour. There are no restrictions for this procedure. This will need to be scheduled in 3 months.  Your physician recommends that you schedule a follow-up appointment in: 2 months

## 2018-10-11 NOTE — Progress Notes (Signed)
Advanced Heart Failure Clinic Note   Referring Physician: PCP: [name], MD HF Cardiology: Dr. Aundra Dubin  HPI:  Emily [name] is a 58 y.o. female with Chronic systolic CHF, NICM, mitral regurgitation, DM2, GERD, HDL, and Obesity.    Admitted 11/16 - 06/21/18 with acute systolic CHF. Noted by be NICM by cMRI and cath. At least moderate, functional MR noted. Diuresed with IV lasix and meds adjusted as tolerated.  She returns for followup of CHF.  Echo was done today and reviewed, EF 30-35%, diffuse hypokinesis, mild MR.  She gets short of breath walking longer distances such as across the grocery store. She can walk 2/10 of a mile without problems.  Chronic 3 pillow orthopnea x years.  No trouble with ADLs.  No chest pain.  No palpitations or lightheadedness. Weight is down 3 lbs.   Labs (1/20): K 3.9, creatinine 0.97   Review of systems complete and found to be negative unless listed in HPI.    Past Medical History 1. Chronic systolic CHF: No significant coronary disease on LHC in 11/19. RHC (11/19) with mean RA 6, PA 42/20, mean PCWP 25, CI 1.8, PVR 1.7 WU. cMRI (11/19) showed EF 28%, mildly decreased RV size, and no myocardial LGE.  - TEE 06/20/18: LVEF 25-30%, Normal RV size and mildly decreased function, Mod LAE, Small PFO, Mild TR, Trivial PI, moderate-severe MR but looked more severe visually. MR is central suggestive of functional MR.  - Echo (3/20): EF 30-35%, diffuse hypokinesis, mild MR.  2. Mitral regurgitation: Moderate-severe functional MR by TEE in 11/19.  Echo (3/20) with only mild MR.  3. DM2 4. Hyperlipidemia.  5. GERD  Current Outpatient Medications  Medication Sig Dispense Refill  . acetaminophen (TYLENOL) 500 MG tablet Take 500 mg by mouth every 6 (six) hours as needed for moderate pain.    Marland Kitchen aspirin 81 MG tablet Take 81 mg by mouth daily.    Marland Kitchen atorvastatin (LIPITOR) 20 MG tablet Take 1 tablet (20 mg total) by mouth daily at 6 PM. 90 tablet 2  . Blood  Glucose Monitoring Suppl (ONE TOUCH ULTRA 2) w/Device KIT USE TO TEST UP TO 4 TIMES PER DAY. 1 each 1  . cholecalciferol (VITAMIN D) 1000 UNITS tablet Take 1,000 Units by mouth daily.    . famotidine (PEPCID) 20 MG tablet Take 1 tablet (20 mg total) by mouth daily. 30 tablet 6  . furosemide (LASIX) 40 MG tablet Take 1 tablet (40 mg total) by mouth daily. 90 tablet 2  . glucose blood (ONE TOUCH ULTRA TEST) test strip USE TO TEST UP TO 4 TIMES A DAY. 100 each 11  . Guaifenesin (MUCINEX MAXIMUM STRENGTH) 1200 MG TB12 Take 1 tablet (1,200 mg total) by mouth every 12 (twelve) hours as needed. 14 tablet 1  . ibuprofen (ADVIL,MOTRIN) 200 MG tablet Take 200 mg by mouth every 6 (six) hours as needed for mild pain. Reported on 10/26/2015    . metFORMIN (GLUCOPHAGE) 850 MG tablet TAKE 1 TABLET BY MOUTH TWICE A DAY WITH A MEAL 180 tablet 1  . metoprolol succinate (TOPROL-XL) 25 MG 24 hr tablet Take 1 tablet (25 mg total) by mouth 2 (two) times daily. 180 tablet 3  . Multiple Vitamins-Minerals (MULTIVITAMIN WITH MINERALS) tablet Take 1 tablet by mouth daily.    . nitroGLYCERIN (NITROSTAT) 0.4 MG SL tablet Place 1 tablet (0.4 mg total) under the tongue every 5 (five) minutes as needed for chest pain. 25 tablet 1  .  potassium chloride SA (K-DUR,KLOR-CON) 20 MEQ tablet Take 3 tablets (60 mEq total) by mouth daily for 1 day, THEN 1 tablet (20 mEq total) daily for 30 days. (Patient taking differently: 1 tablet (20 mEq total) daily) 90 tablet 3  . sacubitril-valsartan (ENTRESTO) 24-26 MG Take 1 tablet by mouth 2 (two) times daily. 180 tablet 2  . spironolactone (ALDACTONE) 25 MG tablet Take 1 tablet (25 mg total) by mouth daily. 90 tablet 3  . thiamine (VITAMIN B-1) 100 MG tablet Take 100 mg by mouth daily.     No current facility-administered medications for this encounter.     Allergies  Allergen Reactions  . Hornet Venom Swelling  . Metoprolol     depression  . Tetanus Toxoids Other (See Comments)    Big red  rash and swelling in the muscle      Social History   Socioeconomic History  . Marital status: Married    Spouse name: Not on file  . Number of children: 5  . Years of education: Not on file  . Highest education level: Master's degree (e.g., MA, MS, MEng, MEd, MSW, MBA)  Occupational History  . 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 Financial resource strain: Not very hard  . Food insecurity:    Worry: Never true    Inability: Never true  . Transportation needs:    Medical: No    Non-medical: No  Tobacco Use  . Smoking status: Never Smoker  . Smokeless tobacco: Never Used  Substance and Sexual Activity  . Alcohol use: No  . Drug use: No  . Sexual activity: Yes  Lifestyle  . Physical activity:    Days per week: Not on file    Minutes per session: Not on file  . Stress: Not on file  Relationships  . Social connections:    Talks on phone: Not on file    Gets together: Not on file    Attends religious service: Not on file    Active member of club or organization: Not on file    Attends meetings of clubs or organizations: Not on file    Relationship status: Not on file  . Intimate partner violence:    Fear of current or ex partner: Not on file    Emotionally abused: Not on file    Physically abused: Not on file    Forced sexual activity: Not on file  Other Topics Concern  . Not on file  Social History Narrative   Married.  Education: [employer].            Family History  Problem Relation Age of Onset  . Heart disease Mother   . COPD Mother   . Emphysema Mother   . Cancer Mother        oral  . Heart disease Father   . Diabetes Father   . COPD Father   . Vision loss Father   . Skin cancer Father        skin  . Hyperlipidemia Father   . Hypertension Father   . Heart disease Sister   . Graves' disease Sister   . Diabetes Sister   . Hyperlipidemia Sister   . Heart failure Sister 26  . Heart disease Brother   . Hyperlipidemia Brother   . Cancer Paternal  Grandmother        unknown  . Prostate cancer Paternal Grandfather        Prostate cancer  . Breast cancer Neg Hx  Vitals:   10/10/18 0959  BP: 120/72  Pulse: 100  SpO2: 98%  Weight: 70.2 kg (154 lb 12.8 oz)    Wt Readings from Last 3 Encounters:  10/10/18 70.2 kg (154 lb 12.8 oz)  08/22/18 71.5 kg (157 lb 9.6 oz)  07/24/18 71.7 kg (158 lb)     PHYSICAL EXAM: General: NAD Neck: No JVD, no thyromegaly or thyroid nodule.  Lungs: Clear to auscultation bilaterally with normal respiratory effort. CV: Nondisplaced PMI.  Heart regular S1/S2, no S3/S4, no murmur.  No peripheral edema.  No carotid bruit.  Normal pedal pulses.  Abdomen: Soft, nontender, no hepatosplenomegaly, no distention.  Skin: Intact without lesions or rashes.  Neurologic: Alert and oriented x 3.  Psych: Normal affect. Extremities: No clubbing or cyanosis.  HEENT: Normal.   ASSESSMENT & PLAN:  1. Chronic systolic CHF: Nonischemic cardiomyopathy by [facility] 06/2018. cMRI showed EF 28%, mildly decreased RV size, and no myocardial LGE. Echo was done today and reviewed, EF 30-35%.  NYHA class II symptoms, she is not volume overloaded on exam.  - Continue Lasix 40 mg daily - Increase Toprol XL to 25 mg bid.  - Continue Entresto 24/26 mg BID - Increase spironolactone to 25 mg daily with BMET today and again in 10 days.  - She has had mild improvement in EF. Repeat echo in 3 months.  If EF < 35%, will refer for ICD. She is not a CRT candidate with narrow QRS.   2. Mitral regurgitation: Moderate-severe by TEE 06/20/18.  Echo done today was reviewed, there only appears to be mild mitral regurgitation now.  3. DM2: Per PCP. Should consider Farxiga/Jardiance.   Followup with pharmacist in 3 wks and me in 2 months.   [name] 10/11/2018

## 2018-10-22 ENCOUNTER — Ambulatory Visit (HOSPITAL_COMMUNITY)
Admission: RE | Admit: 2018-10-22 | Discharge: 2018-10-22 | Disposition: A | Discharge status: Home / Self Care | Payer: 59 | Source: Ambulatory Visit | Attending: Internal Medicine | Admitting: Internal Medicine

## 2018-10-22 ENCOUNTER — Other Ambulatory Visit: Payer: Self-pay

## 2018-10-22 DIAGNOSIS — I5032 Chronic diastolic (congestive) heart failure: Secondary | ICD-10-CM | POA: Diagnosis not present

## 2018-10-22 LAB — BASIC METABOLIC PANEL
Anion gap: 13 (ref 5–15)
BUN: 22 mg/dL — AB (ref 6–20)
CHLORIDE: 101 mmol/L (ref 98–111)
CO2: 22 mmol/L (ref 22–32)
Calcium: 9.3 mg/dL (ref 8.9–10.3)
Creatinine, Ser: 0.95 mg/dL (ref 0.44–1.00)
GFR calc Af Amer: >60 mL/min (ref >60)
GFR calc non Af Amer: >60 mL/min (ref >60)
Glucose, Bld: 240 mg/dL — ABNORMAL HIGH (ref 70–99)
Potassium: 3.9 mmol/L (ref 3.5–5.1)
Sodium: 136 mmol/L (ref 135–145)

## 2018-11-12 ENCOUNTER — Inpatient Hospital Stay (HOSPITAL_COMMUNITY): Admission: RE | Admit: 2018-11-12 | Payer: 59 | Source: Ambulatory Visit

## 2018-11-12 ENCOUNTER — Telehealth (HOSPITAL_COMMUNITY): Payer: Self-pay | Admitting: Pharmacist

## 2018-11-12 MED ORDER — SPIRONOLACTONE 25 MG PO TABS: 25.0000 mg | ORAL_TABLET | Freq: Every day | ORAL | 3 refills | Status: DC

## 2018-11-12 MED ORDER — METOPROLOL SUCCINATE ER 25 MG PO TB24: 25.0000 mg | ORAL_TABLET | Freq: Two times a day (BID) | ORAL | 3 refills | Status: DC

## 2018-11-12 NOTE — Telephone Encounter (Signed)
Pharmacy clinic tele call d/t office visit restrictions for COVID  She is compliant with low salt diet, weighs daily. Wt may creep up 1-2 lb then back down no extra furosemide.  No SOB at rest or with ADL.  Now is walking to circle and back  About 0.37mi without SOB.  This is an improvement for her.  She does not check BP/HR at home, occasionally dizzy when standing.   Reviewed all meds with her - compliant.  Will not make med changes today will f/u with [facility].   Reviewed most recent labs  Cr and K stable after recent increase spironolactone. Refills sent to [facility].D. CPP, BCPS Clinical Pharmacist [phone] 11/12/2018 11:08 AM

## 2018-11-16 ENCOUNTER — Other Ambulatory Visit: Payer: Self-pay

## 2018-11-16 MED ORDER — FUROSEMIDE 40 MG PO TABS: 40.0000 mg | ORAL_TABLET | Freq: Every day | ORAL | 2 refills | Status: DC

## 2018-12-11 ENCOUNTER — Encounter (HOSPITAL_COMMUNITY): Payer: 59 | Admitting: Cardiology

## 2018-12-12 ENCOUNTER — Other Ambulatory Visit: Payer: Self-pay

## 2018-12-12 MED ORDER — FAMOTIDINE 20 MG PO TABS: 20.0000 mg | ORAL_TABLET | Freq: Every day | ORAL | 6 refills | Status: DC

## 2019-01-09 ENCOUNTER — Other Ambulatory Visit (HOSPITAL_COMMUNITY): Payer: 59

## 2019-01-11 ENCOUNTER — Telehealth (HOSPITAL_COMMUNITY): Payer: Self-pay

## 2019-01-11 NOTE — Telephone Encounter (Signed)
Pt complained of 2lb weight gain over the last week with increased cough and increased fatigue. Said symptoms were getting progressively worse over the last week.

## 2019-01-11 NOTE — Telephone Encounter (Signed)
   Please call and instruct to take an extra 40 mg of lasix for 2 days then back to 40 mg daily.   Instruct to call back if SOB does not improve.   Amy Clegg NP-C  2:51 PM

## 2019-01-11 NOTE — Telephone Encounter (Signed)
Spoke with pt. Relayed instructions per Amy NP-C. Pt verbalized understanding.

## 2019-02-08 ENCOUNTER — Encounter (HOSPITAL_COMMUNITY): Payer: Self-pay | Admitting: Cardiology

## 2019-02-08 ENCOUNTER — Ambulatory Visit (HOSPITAL_BASED_OUTPATIENT_CLINIC_OR_DEPARTMENT_OTHER)
Admission: RE | Admit: 2019-02-08 | Discharge: 2019-02-08 | Disposition: A | Discharge status: Home / Self Care | Payer: 59 | Source: Ambulatory Visit | Attending: Cardiology | Admitting: Cardiology

## 2019-02-08 ENCOUNTER — Ambulatory Visit (HOSPITAL_COMMUNITY)
Admission: RE | Admit: 2019-02-08 | Discharge: 2019-02-08 | Disposition: A | Discharge status: Home / Self Care | Payer: 59 | Source: Ambulatory Visit | Attending: Cardiology | Admitting: Cardiology

## 2019-02-08 ENCOUNTER — Other Ambulatory Visit: Payer: Self-pay

## 2019-02-08 VITALS — BP 98/68 | HR 87 | Wt 147.4 lb

## 2019-02-08 DIAGNOSIS — Z91048 Other nonmedicinal substance allergy status: Secondary | ICD-10-CM | POA: Diagnosis not present

## 2019-02-08 DIAGNOSIS — E669 Obesity, unspecified: Secondary | ICD-10-CM | POA: Diagnosis not present

## 2019-02-08 DIAGNOSIS — Z8249 Family history of ischemic heart disease and other diseases of the circulatory system: Secondary | ICD-10-CM | POA: Diagnosis not present

## 2019-02-08 DIAGNOSIS — Z809 Family history of malignant neoplasm, unspecified: Secondary | ICD-10-CM | POA: Insufficient documentation

## 2019-02-08 DIAGNOSIS — I5032 Chronic diastolic (congestive) heart failure: Secondary | ICD-10-CM

## 2019-02-08 DIAGNOSIS — E119 Type 2 diabetes mellitus without complications: Secondary | ICD-10-CM | POA: Insufficient documentation

## 2019-02-08 DIAGNOSIS — Z8042 Family history of malignant neoplasm of prostate: Secondary | ICD-10-CM | POA: Diagnosis not present

## 2019-02-08 DIAGNOSIS — I340 Nonrheumatic mitral (valve) insufficiency: Secondary | ICD-10-CM | POA: Diagnosis not present

## 2019-02-08 DIAGNOSIS — Z79899 Other long term (current) drug therapy: Secondary | ICD-10-CM | POA: Diagnosis not present

## 2019-02-08 DIAGNOSIS — K219 Gastro-esophageal reflux disease without esophagitis: Secondary | ICD-10-CM | POA: Insufficient documentation

## 2019-02-08 DIAGNOSIS — I428 Other cardiomyopathies: Secondary | ICD-10-CM | POA: Diagnosis not present

## 2019-02-08 DIAGNOSIS — I5022 Chronic systolic (congestive) heart failure: Secondary | ICD-10-CM | POA: Insufficient documentation

## 2019-02-08 DIAGNOSIS — Z7984 Long term (current) use of oral hypoglycemic drugs: Secondary | ICD-10-CM | POA: Diagnosis not present

## 2019-02-08 DIAGNOSIS — Z808 Family history of malignant neoplasm of other organs or systems: Secondary | ICD-10-CM | POA: Diagnosis not present

## 2019-02-08 DIAGNOSIS — E785 Hyperlipidemia, unspecified: Secondary | ICD-10-CM | POA: Insufficient documentation

## 2019-02-08 DIAGNOSIS — Z888 Allergy status to other drugs, medicaments and biological substances status: Secondary | ICD-10-CM | POA: Diagnosis not present

## 2019-02-08 DIAGNOSIS — Z7982 Long term (current) use of aspirin: Secondary | ICD-10-CM | POA: Diagnosis not present

## 2019-02-08 DIAGNOSIS — Z887 Allergy status to serum and vaccine status: Secondary | ICD-10-CM | POA: Insufficient documentation

## 2019-02-08 LAB — BASIC METABOLIC PANEL
Anion gap: 13 (ref 5–15)
BUN: 19 mg/dL (ref 6–20)
CO2: 27 mmol/L (ref 22–32)
Calcium: 10 mg/dL (ref 8.9–10.3)
Chloride: 102 mmol/L (ref 98–111)
Creatinine, Ser: 1.03 mg/dL — ABNORMAL HIGH (ref 0.44–1.00)
GFR calc Af Amer: >60 mL/min (ref >60)
GFR calc non Af Amer: 59 mL/min — ABNORMAL LOW (ref >60)
Glucose, Bld: 109 mg/dL — ABNORMAL HIGH (ref 70–99)
Potassium: 4.4 mmol/L (ref 3.5–5.1)
Sodium: 142 mmol/L (ref 135–145)

## 2019-02-08 MED ORDER — DAPAGLIFLOZIN PROPANEDIOL 10 MG PO TABS: 10.0000 mg | ORAL_TABLET | Freq: Every day | ORAL | 6 refills | Status: DC

## 2019-02-08 NOTE — Patient Instructions (Addendum)
START Farxiga 10mg  (1 tab) daily  Labs today  We will only contact you if something comes back abnormal or we need to make some changes. Otherwise no news is good news!  REPEAT LABS on 02/21/19 at [place] recommends that you schedule a follow-up appointment in: 3 months with Dr Aundra Dubin, you will get a call to schedule this appointment.   At the [facility], you and your health needs are our priority. As part of our continuing mission to provide you with exceptional heart care, we have created designated Provider Care Teams. These Care Teams include your primary Cardiologist (physician) and Advanced Practice Providers (APPs- Physician Assistants and Nurse Practitioners) who all work together to provide you with the care you need, when you need it.   You may see any of the following providers on your designated Care Team at your next follow up: Marland Kitchen Dr Glori Bickers . Dr [name] . Darrick Grinder, NP

## 2019-02-08 NOTE — Progress Notes (Signed)
  Echocardiogram 2D Echocardiogram has been performed.  Nazaire Cordial G Youssouf Shipley 02/08/2019, 12:07 PM

## 2019-02-10 NOTE — Progress Notes (Signed)
Advanced Heart Failure Clinic Note   Referring Physician: PCP: [name], MD HF Cardiology: Dr. Aundra Dubin  HPI:  Emily [name] Emily [name] is a 58 y.o. female with Chronic systolic CHF, NICM, mitral regurgitation, DM2, GERD, HDL, and Obesity.    Admitted 11/16 - 06/21/18 with acute systolic CHF. Noted by be NICM by cMRI and cath. At least moderate, functional MR noted. Diuresed with IV lasix and meds adjusted as tolerated.  She returns for followup of CHF.  Echo in 3/20 showed EF 30-35%, diffuse hypokinesis, mild MR.  Echo was done today and reviewed, EF up to 40-45%.  She has been doing well, weight down 7 lbs.  She is short of breath with stairs/inclines and mildly orthopneic (uses 2 pillows).  Occasional lightheadedness when she stands, resolves quickly.    Labs (1/20): K 3.9, creatinine 0.97  Labs (3/20): K 3.9, creatinine 0.95  Review of systems complete and found to be negative unless listed in HPI.    Past Medical History 1. Chronic systolic CHF: No significant coronary disease on LHC in 11/19. RHC (11/19) with mean RA 6, PA 42/20, mean PCWP 25, CI 1.8, PVR 1.7 WU. cMRI (11/19) showed EF 28%, mildly decreased RV size, and no myocardial LGE.  - TEE 06/20/18: LVEF 25-30%, Normal RV size and mildly decreased function, Mod LAE, Small PFO, Mild TR, Trivial PI, moderate-severe MR but looked more severe visually. MR is central suggestive of functional MR.  - Echo (3/20): EF 30-35%, diffuse hypokinesis, mild MR.  - Echo (7/20): EF 40-45% 2. Mitral regurgitation: Moderate-severe functional MR by TEE in 11/19.  Echo (3/20) with only mild MR.  3. DM2 4. Hyperlipidemia.  5. GERD  Current Outpatient Medications  Medication Sig Dispense Refill  . acetaminophen (TYLENOL) 500 MG tablet Take 500 mg by mouth every 6 (six) hours as needed for moderate pain.    Marland Kitchen aspirin 81 MG tablet Take 81 mg by mouth daily.    Marland Kitchen atorvastatin (LIPITOR) 20 MG tablet Take 1 tablet (20 mg total) by mouth daily at  6 PM. 90 tablet 2  . Blood Glucose Monitoring Suppl (ONE TOUCH ULTRA 2) w/Device KIT USE TO TEST UP TO 4 TIMES PER DAY. 1 each 1  . cholecalciferol (VITAMIN D) 1000 UNITS tablet Take 1,000 Units by mouth daily.    . famotidine (PEPCID) 20 MG tablet Take 1 tablet (20 mg total) by mouth daily. 30 tablet 6  . furosemide (LASIX) 40 MG tablet Take 1 tablet (40 mg total) by mouth daily. 90 tablet 2  . glucose blood (ONE TOUCH ULTRA TEST) test strip USE TO TEST UP TO 4 TIMES A DAY. 100 each 11  . Guaifenesin (MUCINEX MAXIMUM STRENGTH) 1200 MG TB12 Take 1 tablet (1,200 mg total) by mouth every 12 (twelve) hours as needed. 14 tablet 1  . metFORMIN (GLUCOPHAGE) 850 MG tablet TAKE 1 TABLET BY MOUTH TWICE A DAY WITH A MEAL 180 tablet 1  . metoprolol succinate (TOPROL-XL) 25 MG 24 hr tablet Take 1 tablet (25 mg total) by mouth 2 (two) times daily. 180 tablet 3  . Multiple Vitamins-Minerals (MULTIVITAMIN WITH MINERALS) tablet Take 1 tablet by mouth daily.    . nitroGLYCERIN (NITROSTAT) 0.4 MG SL tablet Place 1 tablet (0.4 mg total) under the tongue every 5 (five) minutes as needed for chest pain. 25 tablet 1  . potassium chloride SA (K-DUR,KLOR-CON) 20 MEQ tablet Take 3 tablets (60 mEq total) by mouth daily for 1 day, THEN 1 tablet (  20 mEq total) daily for 30 days. (Patient taking differently: 1 tablet (20 mEq total) daily) 90 tablet 3  . sacubitril-valsartan (ENTRESTO) 24-26 MG Take 1 tablet by mouth 2 (two) times daily. 180 tablet 2  . spironolactone (ALDACTONE) 25 MG tablet Take 1 tablet (25 mg total) by mouth daily. 90 tablet 3  . thiamine (VITAMIN B-1) 100 MG tablet Take 100 mg by mouth daily.    . dapagliflozin propanediol (FARXIGA) 10 MG TABS tablet Take 10 mg by mouth daily. 30 tablet 6   No current facility-administered medications for this encounter.     Allergies  Allergen Reactions  . Hornet Venom Swelling  . Metoprolol     depression  . Tetanus Toxoids Other (See Comments)    Big red rash  and swelling in the muscle      Social History   Socioeconomic History  . Marital status: Married    Spouse name: Not on file  . Number of children: 5  . Years of education: Not on file  . Highest education level: Master's degree (e.g., MA, MS, MEng, MEd, MSW, MBA)  Occupational History  . 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 Financial resource strain: Not very hard  . Food insecurity    Worry: Never true    Inability: Never true  . Transportation needs    Medical: No    Non-medical: No  Tobacco Use  . Smoking status: Never Smoker  . Smokeless tobacco: Never Used  Substance and Sexual Activity  . Alcohol use: No  . Drug use: No  . Sexual activity: Yes  Lifestyle  . Physical activity    Days per week: Not on file    Minutes per session: Not on file  . Stress: Not on file  Relationships  . Social Herbalist on phone: Not on file    Gets together: Not on file    Attends religious service: Not on file    Active member of club or organization: Not on file    Attends meetings of clubs or organizations: Not on file    Relationship status: Not on file  . Intimate partner violence    Fear of current or ex partner: Not on file    Emotionally abused: Not on file    Physically abused: Not on file    Forced sexual activity: Not on file  Other Topics Concern  . Not on file  Social History Narrative   Married.  Education: [employer].            Family History  Problem Relation Age of Onset  . Heart disease Mother   . COPD Mother   . Emphysema Mother   . Cancer Mother        oral  . Heart disease Father   . Diabetes Father   . COPD Father   . Vision loss Father   . Skin cancer Father        skin  . Hyperlipidemia Father   . Hypertension Father   . Heart disease Sister   . Graves' disease Sister   . Diabetes Sister   . Hyperlipidemia Sister   . Heart failure Sister 14  . Heart disease Brother   . Hyperlipidemia Brother   . Cancer Paternal Grandmother         unknown  . Prostate cancer Paternal Grandfather        Prostate cancer  . Breast cancer Neg Hx     Vitals:  02/08/19 1205  BP: 98/68  Pulse: 87  SpO2: 98%  Weight: 66.9 kg (147 lb 6.4 oz)    Wt Readings from Last 3 Encounters:  02/08/19 66.9 kg (147 lb 6.4 oz)  10/10/18 70.2 kg (154 lb 12.8 oz)  08/22/18 71.5 kg (157 lb 9.6 oz)     PHYSICAL EXAM: General: NAD Neck: No JVD, no thyromegaly or thyroid nodule.  Lungs: Clear to auscultation bilaterally with normal respiratory effort. CV: Nondisplaced PMI.  Heart regular S1/S2, no S3/S4, no murmur.  No peripheral edema.  No carotid bruit.  Normal pedal pulses.  Abdomen: Soft, nontender, no hepatosplenomegaly, no distention.  Skin: Intact without lesions or rashes.  Neurologic: Alert and oriented x 3.  Psych: Normal affect. Extremities: No clubbing or cyanosis.  HEENT: Normal.   ASSESSMENT & PLAN:  1. Chronic systolic CHF: Nonischemic cardiomyopathy by [facility] 06/2018. cMRI showed EF 28%, mildly decreased RV size, and no myocardial LGE. Echo was done today and reviewed, EF up to 40-45%.  NYHA class II symptoms, she is not volume overloaded on exam.  - Continue Lasix 40 mg daily - Continue Toprol XL 25 mg bid.  - Continue Entresto 24/26 mg BID - Continue spironolactone 25 mg daily.  - With soft BP, I will not increase Toprol XL or Entresto.   - I will add empagliflozin 10 mg daily (she has co-existing diabetes). BMET today and in 10 days.  - EF is out of ICD range.  2. Mitral regurgitation: Moderate-severe by TEE 06/20/18.  Today's echo with mild MR.  3. DM2: Adding empagliflozin as above.    Followup in 3 months.    [name] 02/10/2019

## 2019-02-21 ENCOUNTER — Ambulatory Visit (HOSPITAL_COMMUNITY)
Admission: RE | Admit: 2019-02-21 | Discharge: 2019-02-21 | Disposition: A | Discharge status: Home / Self Care | Payer: 59 | Source: Ambulatory Visit | Attending: Internal Medicine | Admitting: Internal Medicine

## 2019-02-21 ENCOUNTER — Other Ambulatory Visit: Payer: Self-pay

## 2019-02-21 DIAGNOSIS — I5032 Chronic diastolic (congestive) heart failure: Secondary | ICD-10-CM | POA: Diagnosis present

## 2019-02-21 LAB — BASIC METABOLIC PANEL
Anion gap: 9 (ref 5–15)
BUN: 27 mg/dL — ABNORMAL HIGH (ref 6–20)
CO2: 27 mmol/L (ref 22–32)
Calcium: 9.7 mg/dL (ref 8.9–10.3)
Chloride: 101 mmol/L (ref 98–111)
Creatinine, Ser: 1.25 mg/dL — ABNORMAL HIGH (ref 0.44–1.00)
GFR calc Af Amer: 55 mL/min — ABNORMAL LOW (ref >60)
GFR calc non Af Amer: 47 mL/min — ABNORMAL LOW (ref >60)
Glucose, Bld: 125 mg/dL — ABNORMAL HIGH (ref 70–99)
Potassium: 4.7 mmol/L (ref 3.5–5.1)
Sodium: 137 mmol/L (ref 135–145)

## 2019-02-28 ENCOUNTER — Telehealth: Payer: Self-pay

## 2019-02-28 ENCOUNTER — Other Ambulatory Visit: Payer: Self-pay

## 2019-02-28 MED ORDER — ATORVASTATIN CALCIUM 20 MG PO TABS: 20.0000 mg | ORAL_TABLET | Freq: Every day | ORAL | 2 refills | Status: DC

## 2019-03-01 NOTE — Telephone Encounter (Signed)
Refill

## 2019-04-10 ENCOUNTER — Other Ambulatory Visit (HOSPITAL_COMMUNITY): Payer: Self-pay | Admitting: *Deleted

## 2019-04-10 NOTE — Telephone Encounter (Signed)
Pt left VM to requesting an appt with Dr.McLean I called the patient back no answer/left vm.

## 2019-04-22 ENCOUNTER — Encounter (HOSPITAL_COMMUNITY): Payer: Self-pay

## 2019-04-22 ENCOUNTER — Ambulatory Visit (HOSPITAL_COMMUNITY)
Admission: RE | Admit: 2019-04-22 | Discharge: 2019-04-22 | Disposition: A | Discharge status: Home / Self Care | Payer: 59 | Source: Ambulatory Visit | Attending: Internal Medicine | Admitting: Internal Medicine

## 2019-04-22 ENCOUNTER — Ambulatory Visit (HOSPITAL_COMMUNITY)
Admission: RE | Admit: 2019-04-22 | Discharge: 2019-04-22 | Disposition: A | Discharge status: Home / Self Care | Payer: 59 | Source: Ambulatory Visit | Attending: Cardiology | Admitting: Cardiology

## 2019-04-22 ENCOUNTER — Other Ambulatory Visit: Payer: Self-pay

## 2019-04-22 VITALS — BP 102/68 | HR 90 | Wt 158.2 lb

## 2019-04-22 DIAGNOSIS — E785 Hyperlipidemia, unspecified: Secondary | ICD-10-CM | POA: Diagnosis not present

## 2019-04-22 DIAGNOSIS — E119 Type 2 diabetes mellitus without complications: Secondary | ICD-10-CM | POA: Insufficient documentation

## 2019-04-22 DIAGNOSIS — I5022 Chronic systolic (congestive) heart failure: Secondary | ICD-10-CM

## 2019-04-22 DIAGNOSIS — I428 Other cardiomyopathies: Secondary | ICD-10-CM | POA: Diagnosis not present

## 2019-04-22 DIAGNOSIS — Z7982 Long term (current) use of aspirin: Secondary | ICD-10-CM | POA: Diagnosis not present

## 2019-04-22 DIAGNOSIS — Z79899 Other long term (current) drug therapy: Secondary | ICD-10-CM | POA: Diagnosis not present

## 2019-04-22 DIAGNOSIS — I5032 Chronic diastolic (congestive) heart failure: Secondary | ICD-10-CM | POA: Diagnosis not present

## 2019-04-22 DIAGNOSIS — Z8249 Family history of ischemic heart disease and other diseases of the circulatory system: Secondary | ICD-10-CM | POA: Insufficient documentation

## 2019-04-22 DIAGNOSIS — I340 Nonrheumatic mitral (valve) insufficiency: Secondary | ICD-10-CM | POA: Insufficient documentation

## 2019-04-22 DIAGNOSIS — E669 Obesity, unspecified: Secondary | ICD-10-CM | POA: Insufficient documentation

## 2019-04-22 DIAGNOSIS — Z7984 Long term (current) use of oral hypoglycemic drugs: Secondary | ICD-10-CM | POA: Insufficient documentation

## 2019-04-22 DIAGNOSIS — R002 Palpitations: Secondary | ICD-10-CM

## 2019-04-22 DIAGNOSIS — K219 Gastro-esophageal reflux disease without esophagitis: Secondary | ICD-10-CM | POA: Diagnosis not present

## 2019-04-22 DIAGNOSIS — I5023 Acute on chronic systolic (congestive) heart failure: Secondary | ICD-10-CM | POA: Insufficient documentation

## 2019-04-22 LAB — CBC
HCT: 41.2 % (ref 36.0–46.0)
Hemoglobin: 14.3 g/dL (ref 12.0–15.0)
MCH: 31.8 pg (ref 26.0–34.0)
MCHC: 34.7 g/dL (ref 30.0–36.0)
MCV: 91.6 fL (ref 80.0–100.0)
Platelets: 288 10*3/uL (ref 150–400)
RBC: 4.5 MIL/uL (ref 3.87–5.11)
RDW: 12.7 % (ref 11.5–15.5)
WBC: 8.2 10*3/uL (ref 4.0–10.5)
nRBC: 0 % (ref 0.0–0.2)

## 2019-04-22 LAB — TSH: TSH: 1.326 u[IU]/mL (ref 0.350–4.500)

## 2019-04-22 NOTE — Progress Notes (Signed)
Advanced Heart Failure Clinic Note   Referring Physician: PCP: [name], MD HF Cardiology: Dr. Aundra Dubin  HPI: Emily [name] is a 58 y.o. female with Chronic systolic CHF, NICM, mitral regurgitation, DM2, GERD, HDL, and Obesity.    Admitted 11/16 - 06/21/18 with acute systolic CHF. Noted by be NICM by cMRI and cath. At least moderate, functional MR noted. Diuresed with IV lasix and meds adjusted as tolerated.  Today she returns for HF follow up.Overall feeling ok. She has noticed palpitations on a daily occurrence. Says she notices her pulse on the fit bit 90--140s. She has not tried to check her pulse. Denies SOB/PND/Orthopnea. Appetite ok. No fever or chills. Weight at home has been stable. Not walking around outside the house.  Taking all medications  Labs (1/20): K 3.9, creatinine 0.97  Labs (3/20): K 3.9, creatinine 0.95 Labs (02/08/19): K 4.4 Creatinine 1.03   Review of systems complete and found to be negative unless listed in HPI.    Past Medical History 1. Chronic systolic CHF: No significant coronary disease on LHC in 11/19. RHC (11/19) with mean RA 6, PA 42/20, mean PCWP 25, CI 1.8, PVR 1.7 WU. cMRI (11/19) showed EF 28%, mildly decreased RV size, and no myocardial LGE.  - TEE 06/20/18: LVEF 25-30%, Normal RV size and mildly decreased function, Mod LAE, Small PFO, Mild TR, Trivial PI, moderate-severe MR but looked more severe visually. MR is central suggestive of functional MR.  - Echo (3/20): EF 30-35%, diffuse hypokinesis, mild MR.  - Echo (7/20): EF 40-45% 2. Mitral regurgitation: Moderate-severe functional MR by TEE in 11/19.  Echo (3/20) with only mild MR.  3. DM2 4. Hyperlipidemia.  5. GERD  Current Outpatient Medications  Medication Sig Dispense Refill  . acetaminophen (TYLENOL) 500 MG tablet Take 500 mg by mouth every 6 (six) hours as needed for moderate pain.    Marland Kitchen aspirin 81 MG tablet Take 81 mg by mouth daily.    Marland Kitchen atorvastatin (LIPITOR) 20 MG tablet  Take 1 tablet (20 mg total) by mouth daily at 6 PM. 90 tablet 2  . Blood Glucose Monitoring Suppl (ONE TOUCH ULTRA 2) w/Device KIT USE TO TEST UP TO 4 TIMES PER DAY. 1 each 1  . cholecalciferol (VITAMIN D) 1000 UNITS tablet Take 1,000 Units by mouth daily.    . dapagliflozin propanediol (FARXIGA) 10 MG TABS tablet Take 10 mg by mouth daily. 30 tablet 6  . famotidine (PEPCID) 20 MG tablet Take 1 tablet (20 mg total) by mouth daily. 30 tablet 6  . furosemide (LASIX) 40 MG tablet Take 1 tablet (40 mg total) by mouth daily. 90 tablet 2  . glucose blood (ONE TOUCH ULTRA TEST) test strip USE TO TEST UP TO 4 TIMES A DAY. 100 each 11  . Guaifenesin (MUCINEX MAXIMUM STRENGTH) 1200 MG TB12 Take 1 tablet (1,200 mg total) by mouth every 12 (twelve) hours as needed. 14 tablet 1  . metFORMIN (GLUCOPHAGE) 850 MG tablet TAKE 1 TABLET BY MOUTH TWICE A DAY WITH A MEAL 180 tablet 1  . metoprolol succinate (TOPROL-XL) 25 MG 24 hr tablet Take 1 tablet (25 mg total) by mouth 2 (two) times daily. 180 tablet 3  . Multiple Vitamins-Minerals (MULTIVITAMIN WITH MINERALS) tablet Take 1 tablet by mouth daily.    . nitroGLYCERIN (NITROSTAT) 0.4 MG SL tablet Place 1 tablet (0.4 mg total) under the tongue every 5 (five) minutes as needed for chest pain. 25 tablet 1  . potassium  chloride SA (K-DUR,KLOR-CON) 20 MEQ tablet Take 3 tablets (60 mEq total) by mouth daily for 1 day, THEN 1 tablet (20 mEq total) daily for 30 days. (Patient taking differently: 1 tablet (20 mEq total) daily) 90 tablet 3  . sacubitril-valsartan (ENTRESTO) 24-26 MG Take 1 tablet by mouth 2 (two) times daily. 180 tablet 2  . spironolactone (ALDACTONE) 25 MG tablet Take 1 tablet (25 mg total) by mouth daily. 90 tablet 3  . thiamine (VITAMIN B-1) 100 MG tablet Take 100 mg by mouth daily.     No current facility-administered medications for this encounter.     Allergies  Allergen Reactions  . Hornet Venom Swelling  . Metoprolol     depression  . Tetanus  Toxoids Other (See Comments)    Big red rash and swelling in the muscle      Social History   Socioeconomic History  . Marital status: Married    Spouse name: Not on file  . Number of children: 5  . Years of education: Not on file  . Highest education level: Master's degree (e.g., MA, MS, MEng, MEd, MSW, MBA)  Occupational History  . 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 Financial resource strain: Not very hard  . Food insecurity    Worry: Never true    Inability: Never true  . Transportation needs    Medical: No    Non-medical: No  Tobacco Use  . Smoking status: Never Smoker  . Smokeless tobacco: Never Used  Substance and Sexual Activity  . Alcohol use: No  . Drug use: No  . Sexual activity: Yes  Lifestyle  . Physical activity    Days per week: Not on file    Minutes per session: Not on file  . Stress: Not on file  Relationships  . Social Herbalist on phone: Not on file    Gets together: Not on file    Attends religious service: Not on file    Active member of club or organization: Not on file    Attends meetings of clubs or organizations: Not on file    Relationship status: Not on file  . Intimate partner violence    Fear of current or ex partner: Not on file    Emotionally abused: Not on file    Physically abused: Not on file    Forced sexual activity: Not on file  Other Topics Concern  . Not on file  Social History Narrative   Married.  Education: [employer].            Family History  Problem Relation Age of Onset  . Heart disease Mother   . COPD Mother   . Emphysema Mother   . Cancer Mother        oral  . Heart disease Father   . Diabetes Father   . COPD Father   . Vision loss Father   . Skin cancer Father        skin  . Hyperlipidemia Father   . Hypertension Father   . Heart disease Sister   . Graves' disease Sister   . Diabetes Sister   . Hyperlipidemia Sister   . Heart failure Sister 57  . Heart disease Brother   .  Hyperlipidemia Brother   . Cancer Paternal Grandmother        unknown  . Prostate cancer Paternal Grandfather        Prostate cancer  . Breast cancer Neg Hx  Vitals:   04/22/19 1338  BP: 102/68  Pulse: 90  SpO2: 97%  Weight: 71.8 kg (158 lb 3.2 oz)    Wt Readings from Last 3 Encounters:  04/22/19 71.8 kg (158 lb 3.2 oz)  02/08/19 66.9 kg (147 lb 6.4 oz)  10/10/18 70.2 kg (154 lb 12.8 oz)     PHYSICAL EXAM: General:  Well appearing. No resp difficulty HEENT: normal Neck: supple. no JVD. Carotids 2+ bilat; no bruits. No lymphadenopathy or thryomegaly appreciated. Cor: PMI nondisplaced. Regular rate & rhythm. No rubs, gallops or murmurs. Lungs: clear Abdomen: soft, nontender, nondistended. No hepatosplenomegaly. No bruits or masses. Good bowel sounds. Extremities: no cyanosis, clubbing, rash, edema Neuro: alert & orientedx3, cranial nerves grossly intact. moves all 4 extremities w/o difficulty. Affect pleasant  EKG: NSR 89 bpm   ASSESSMENT & PLAN:  1. Chronic systolic CHF: Nonischemic cardiomyopathy by [facility] 06/2018. cMRI showed EF 28%, mildly decreased RV size, and no myocardial LGE. Echo7/2020 EF up to 40-45%.  NYHA II. Needs to increase activity. Volume status stable. Continue Lasix 40 mg daily - Continue Toprol XL 25 mg bid.  - Continue Entresto 24/26 mg BID - Continue spironolactone 25 mg daily.  - Will not increase with soft bp.  -Continue empagliflozin 10 mg daily (she has co-existing diabetes).  - EF is out of ICD range.  2. Mitral regurgitation: Moderate-severe by TEE 06/20/18.  Today's echo with mild MR.  3. DM2: Continue empagliflozin as above. 4. Palpitations:  She is concerned about a fast heart rate noted on fit bit. I am not sure this correlates. EKG stable today.  Check TSH, CBC. Place 7 day Zio Patch.    Follow up in 3 months.    Vergie Zahm NP-C  04/22/2019

## 2019-04-22 NOTE — Patient Instructions (Signed)
Labs today We will only contact you if something comes back abnormal or we need to make some changes. Otherwise no news is good news!  Your provider has recommended that  you wear a Zio Patch for 7 days.  This monitor will record your heart rhythm for our review.  IF you have any symptoms while wearing the monitor please press the button.  If you have any issues with the patch or you notice a red or orange light on it please call the company at [phone].  Once you remove the patch please mail it back to the company as soon as possible so we can get the results.  Your physician recommends that you schedule a follow-up appointment in: 3-4 months with Dr Aundra Dubin  Do the following things EVERYDAY: 1) Weigh yourself in the morning before breakfast. Write it down and keep it in a log. 2) Take your medicines as prescribed 3) Eat low salt foods-Limit salt (sodium) to 2000 mg per day.  4) Stay as active as you can everyday 5) Limit all fluids for the day to less than 2 liters  At the [facility], you and your health needs are our priority. As part of our continuing mission to provide you with exceptional heart care, we have created designated Provider Care Teams. These Care Teams include your primary Cardiologist (physician) and Advanced Practice Providers (APPs- Physician Assistants and Nurse Practitioners) who all work together to provide you with the care you need, when you need it.   You may see any of the following providers on your designated Care Team at your next follow up: Marland Kitchen Dr Glori Bickers . Dr [name] . Darrick Grinder, NP   Please be sure to bring in all your medications bottles to every appointment.

## 2019-05-10 ENCOUNTER — Telehealth (HOSPITAL_COMMUNITY): Payer: Self-pay

## 2019-05-10 NOTE — Telephone Encounter (Signed)
Pt aware of results of long term monitor

## 2019-05-10 NOTE — Telephone Encounter (Signed)
-----   Message from Larey Dresser, MD sent at 05/10/2019  9:53 AM EDT ----- Short SVT and NSVT runs, rare.  I do not think any treatment needed.

## 2019-05-29 ENCOUNTER — Telehealth (INDEPENDENT_AMBULATORY_CARE_PROVIDER_SITE_OTHER): Payer: 59 | Admitting: Cardiovascular Disease

## 2019-05-29 DIAGNOSIS — I5042 Chronic combined systolic (congestive) and diastolic (congestive) heart failure: Secondary | ICD-10-CM | POA: Diagnosis not present

## 2019-05-29 DIAGNOSIS — I493 Ventricular premature depolarization: Secondary | ICD-10-CM | POA: Diagnosis not present

## 2019-05-29 DIAGNOSIS — E7800 Pure hypercholesterolemia, unspecified: Secondary | ICD-10-CM | POA: Diagnosis not present

## 2019-05-29 NOTE — Progress Notes (Signed)
Virtual Visit via Video Note   This visit type was conducted due to national recommendations for restrictions regarding the COVID-19 Pandemic (e.g. social distancing) in an effort to limit this patient's exposure and mitigate transmission in our community.  Due to her co-morbid illnesses, this patient is at least at moderate risk for complications without adequate follow up.  This format is felt to be most appropriate for this patient at this time.  All issues noted in this document were discussed and addressed.  A limited physical exam was performed with this format.  Please refer to the patient's chart for her consent to telehealth for [facility].   Date:  05/29/2019   ID:  Emily [name], DOB 1960/12/28, MRN [id number]  Patient Location: Home Provider Location: Home  PCP:  [name], MD  Cardiologist:  Skeet Latch, MD  Electrophysiologist:  None   Evaluation Performed:  Follow-Up Visit  Chief Complaint:  Heart failure  History of Present Illness:    Emily [name] is a 58 y.o. female with chronic systolic and diastolic heart failure, diabetes, hyperlipidemia and family history of premature CAD here for follow up.  Emily [name] was initially seen 05/2015 for chest pain, shortness of breath and palpitations.  She had an ETT on 05/15/15 that was negative for ischemia.  Echo showed LVEF 55-60% with probably hypokinesis of the basal-mid inferolateral and inferior myocardium and grade 1 diastolic dysfunction. She subsequently underwent LHC, which showed no CAD.  Emily [name] also wore a 48 hour Holter that showed frequent PVCs and occasional PACs.  She was started on metoprolol but reported feeling depressed at her follow up appointment.  Metoprolol was discontinued and she was started on propafenone.  This controlled her PVCs well.  However, she was admitted 06/2018 with acute systolic heart failure.  She was found to have a nonischemic cardiomyopathy with LVEF 25% and  global LV dysfunction.  She also had moderate to severe mitral regurgitation.  Cardiac MRI was negative for myocarditis and had no evidence of delayed enhancement.  Propafenone was discontinued, as it is associated with reduced systolic function.    Emily [name] has followed up with the heart failure clinic.  She was started on an excellent heart failure regimen with improvement in her LVEF to 40-45% on 01/2019.  Overall she has been feeling okay.  She has good days and bad days that are split about 50/50 in frequency.  On her bad days she notes that her heart rate is faster and she feels more fatigued.  She has been able to start exercising more.  She likes to walk for exercise.  Last week she was able to go hiking in the mountains.  While on that trip she fell in her hotel room.  She has been less active and has noticed a little more swelling lately.  She denies orthopnea or PND and overall her weight has been stable.  If anything she has been increasing in weight a little but she attributes this to eating more due to the coronavirus.  Emily [name] complains of memory loss.  She is to have a very good memory and this has been troublesome for her.   The patient does not have symptoms concerning for COVID-19 infection (fever, chills, cough, or new shortness of breath).    Past Medical History:  Diagnosis Date   Acute combined systolic and diastolic heart failure ([place]) 06/16/2018   Allergy    CHF (congestive heart failure) ([place])  GERD (gastroesophageal reflux disease)    NICM (nonischemic cardiomyopathy) ([place]) 06/21/2018   NSVT (nonsustained ventricular tachycardia) ([place]) 06/13/2015   PVC's (premature ventricular contractions) 06/11/2015   S/P cardiac cath, 06/20/18 06/21/2018   Type 2 diabetes mellitus ([place])    Past Surgical History:  Procedure Laterality Date   CARDIAC CATHETERIZATION N/A 06/19/2015   Procedure: Left Heart Cath and Coronary Angiography;  Surgeon: Leonie Man, MD;   Location: [place] CV LAB;  Service: Cardiovascular;  Laterality: N/A;   RIGHT/LEFT HEART CATH AND CORONARY ANGIOGRAPHY N/A 06/19/2018   Procedure: RIGHT/LEFT HEART CATH AND CORONARY ANGIOGRAPHY;  Surgeon: Troy Sine, MD;  Location: [place] CV [facility];  Service: Cardiovascular;  Laterality: N/A;   TEE WITHOUT CARDIOVERSION N/A 06/20/2018   Procedure: TRANSESOPHAGEAL ECHOCARDIOGRAM (TEE);  Surgeon: Larey Dresser, MD;  Location[facility] ENDOSCOPY;  Service: Cardiovascular;  Laterality: N/A;   TONSILLECTOMY     when she was 3     Current Meds  Medication Sig   acetaminophen (TYLENOL) 500 MG tablet Take 500 mg by mouth every 6 (six) hours as needed for moderate pain.   aspirin 81 MG tablet Take 81 mg by mouth daily.   Blood Glucose Monitoring Suppl (ONE TOUCH ULTRA 2) w/Device KIT USE TO TEST UP TO 4 TIMES PER DAY.   cholecalciferol (VITAMIN D) 1000 UNITS tablet Take 1,000 Units by mouth daily.   dapagliflozin propanediol (FARXIGA) 10 MG TABS tablet Take 10 mg by mouth daily.   famotidine (PEPCID) 20 MG tablet Take 1 tablet (20 mg total) by mouth daily.   furosemide (LASIX) 40 MG tablet Take 1 tablet (40 mg total) by mouth daily.   glucose blood (ONE TOUCH ULTRA TEST) test strip USE TO TEST UP TO 4 TIMES A DAY.   Guaifenesin (MUCINEX MAXIMUM STRENGTH) 1200 MG TB12 Take 1 tablet (1,200 mg total) by mouth every 12 (twelve) hours as needed.   metFORMIN (GLUCOPHAGE) 850 MG tablet TAKE 1 TABLET BY MOUTH TWICE A DAY WITH A MEAL   metoprolol succinate (TOPROL-XL) 25 MG 24 hr tablet Take 1 tablet (25 mg total) by mouth 2 (two) times daily.   Multiple Vitamins-Minerals (MULTIVITAMIN WITH MINERALS) tablet Take 1 tablet by mouth daily.   nitroGLYCERIN (NITROSTAT) 0.4 MG SL tablet Place 1 tablet (0.4 mg total) under the tongue every 5 (five) minutes as needed for chest pain.   potassium chloride SA (K-DUR,KLOR-CON) 20 MEQ tablet Take 3 tablets (60 mEq total) by mouth daily for 1 day,  THEN 1 tablet (20 mEq total) daily for 30 days. (Patient taking differently: 1 tablet (20 mEq total) daily)   sacubitril-valsartan (ENTRESTO) 24-26 MG Take 1 tablet by mouth 2 (two) times daily.   spironolactone (ALDACTONE) 25 MG tablet Take 1 tablet (25 mg total) by mouth daily.   thiamine (VITAMIN B-1) 100 MG tablet Take 100 mg by mouth daily.   [DISCONTINUED] atorvastatin (LIPITOR) 20 MG tablet Take 1 tablet (20 mg total) by mouth daily at 6 PM.     Allergies:   Hornet venom, Metoprolol, and Tetanus toxoids   Social History   Tobacco Use   Smoking status: Never Smoker   Smokeless tobacco: Never Used  Substance Use Topics   Alcohol use: No   Drug use: No     Family Hx: [name] family history includes COPD in her father and mother; Cancer in her mother and paternal grandmother; Diabetes in her father and sister; Emphysema in her mother; [name]' disease in her sister;  Heart disease in her brother, father, mother, and sister; Heart failure (age of onset: 90) in her sister; Hyperlipidemia in her brother, father, and sister; Hypertension in her father; Prostate cancer in her paternal grandfather; Skin cancer in her father; Vision loss in her father. There is no history of Breast cancer.  ROS:   Please see the history of present illness.    All other systems reviewed and are negative.   Prior CV studies:   The following studies were reviewed today:  Echo 02/08/19: IMPRESSIONS   1. The left ventricle has mild-moderately reduced systolic function, with an ejection fraction of 40-45%. The cavity size was normal. Left ventricular diastolic Doppler parameters are consistent with impaired relaxation. Left ventricular diffuse  hypokinesis.  2. The mitral valve is grossly normal. Mild thickening of the mitral valve leaflet.  3. The tricuspid valve is grossly normal.  4. The aortic valve is tricuspid. Mild thickening of the aortic valve. No stenosis of the aortic valve.  5.  Mild to moderate LV dysfunction (EF 45); mild diastolic dysfunction.  ETT 05/15/15: Negative for ischemia.  LHC 06/19/15: 1. Angiographically normal coronary arteries 2. There is possible mild left ventricular systolic dysfunction - difficult to assess due to ectopy  48 hour Holter 05/04/15:  Quality: Fair. Baseline artifact. Predominant rhythm: sinus Average heart rate: 92 bpm Max heart rate: 141 bpm Min heart rate: 59 bpm Pauses >2.5 seconds: 0 Ventricular ectopics: 1651 (1616 isolated, 4 couplets, 5 runs of NSVT, longest run 6 beats)  Percentage of ectopic beats: 0.65% Morphology: polymorphic Supraventricular ectopics: 18 Patient did not submit a symptom diary but triggered the monitor for one event, at which time sinus rhythm at 79 bpm was recorded.   Labs/Other Tests and Data Reviewed:    EKG:  No ECG reviewed.  Recent Labs: 06/16/2018: B Natriuretic Peptide 417.2 07/11/2018: Magnesium 1.7 07/13/2018: ALT 73 02/21/2019: BUN 27; Creatinine, Ser 1.25; Potassium 4.7; Sodium 137 04/22/2019: Hemoglobin 14.3; Platelets 288; TSH 1.326   Recent Lipid Panel Lab Results  Component Value Date/Time   CHOL 139 07/13/2018 10:24 AM   TRIG 111 07/13/2018 10:24 AM   HDL 45 07/13/2018 10:24 AM   CHOLHDL 3.1 07/13/2018 10:24 AM   CHOLHDL 4.6 06/16/2016 01:38 PM   LDLCALC 72 07/13/2018 10:24 AM   LDLDIRECT 129 (H) 07/27/2012 08:09 AM    Wt Readings from Last 3 Encounters:  04/22/19 158 lb 3.2 oz (71.8 kg)  02/08/19 147 lb 6.4 oz (66.9 kg)  10/10/18 154 lb 12.8 oz (70.2 kg)     Objective:    Vital Signs:  LMP 10/14/2014    VITAL SIGNS:  reviewed GEN:  no acute distress EYES:  sclerae anicteric, EOMI - Extraocular Movements Intact RESPIRATORY:  normal respiratory effort, symmetric expansion CARDIOVASCULAR:  no peripheral edema SKIN:  no rash, lesions or ulcers. MUSCULOSKELETAL:  no obvious deformities. NEURO:  alert and oriented x 3, no obvious focal  deficit PSYCH:  normal affect  ASSESSMENT & PLAN:    # Chronic systolic and diastolic heart failure: LVEF 20% and improved to 40-45%.  Likely due to either propafenone or PVCs.  She also has a family history of heart failure in her mother and sister.  Continue metoprolol, Entresto, dapagliflozin, lasix and spironolactone.    # Hyperlipidemia: LDL was 100 on 03/2019.  However we will stop atorvastatin due to memory loss.  This medication may be contributing.  She will hold it for 1 month.  If there is an improvement in  her symptoms we will choose an alternative statin such as pravastatin.  However, if she has no change in symptoms continue atorvastatin.  # Obesity: Emily [name] was encouraged to keep working on diet and exercise.   # PVCs: Heart catheterization was negative for ischemia and LVEF is >55%.  Continue metoprolol.  COVID-19 Education: The signs and symptoms of COVID-19 were discussed with the patient and how to seek care for testing (follow up with PCP or arrange E-visit).  The importance of social distancing was discussed today.  Time:   Today, I have spent 22 minutes with the patient with telehealth technology discussing the above problems.     Medication Adjustments/Labs and Tests Ordered: Current medicines are reviewed at length with the patient today.  Concerns regarding medicines are outlined above.   Tests Ordered: No orders of the defined types were placed in this encounter.   Medication Changes: No orders of the defined types were placed in this encounter.   Follow Up:  as needed.  She will see the HF clinic and then return to me when she graduates. prn  Signed, Skeet Latch, MD  05/29/2019 10:13 AM    [place]

## 2019-05-29 NOTE — Patient Instructions (Signed)
Medication Insthructions:  STOP ATORVASTATIN FOR 1 MONTH AND SEE HOW YOUR MEMORY IMPROVES CALL THE OFFICE AND GIVE AN UPDATE [phone]  *If you need a refill on your cardiac medications before your next appointment, please call your pharmacy*  Lab Work: NONE  Testing/Procedures: NONE  Follow-Up: AS NEEDED  KEEP FOLLOW UP WITH DR [facility]   WILL ARRANGE FOR A BLOOD PRESSURE MACHINE TO BE SENT TO YOUR HOME

## 2019-06-03 ENCOUNTER — Telehealth: Payer: Self-pay | Admitting: Licensed Clinical Social Worker

## 2019-06-03 NOTE — Telephone Encounter (Signed)
CSW referred to assist patient with obtaining a BP cuff. CSW contacted patient to inform cuff will be delivered to home. Message left. CSW available as needed. Jackie Norman Piacentini, LCSW, CCSW-MCS [phone]  

## 2019-06-11 ENCOUNTER — Other Ambulatory Visit: Payer: Self-pay | Admitting: Cardiovascular Disease

## 2019-07-04 ENCOUNTER — Other Ambulatory Visit (HOSPITAL_COMMUNITY): Payer: Self-pay | Admitting: Cardiology

## 2019-07-23 ENCOUNTER — Other Ambulatory Visit (HOSPITAL_COMMUNITY): Payer: Self-pay | Admitting: Student

## 2019-07-29 ENCOUNTER — Encounter (HOSPITAL_COMMUNITY): Payer: 59 | Admitting: Cardiology

## 2019-08-29 ENCOUNTER — Telehealth (HOSPITAL_COMMUNITY): Payer: Self-pay

## 2019-08-29 NOTE — Telephone Encounter (Signed)

## 2019-08-30 ENCOUNTER — Other Ambulatory Visit: Payer: Self-pay

## 2019-08-30 ENCOUNTER — Encounter (HOSPITAL_COMMUNITY): Payer: Self-pay | Admitting: Cardiology

## 2019-08-30 ENCOUNTER — Ambulatory Visit (HOSPITAL_COMMUNITY)
Admission: RE | Admit: 2019-08-30 | Discharge: 2019-08-30 | Disposition: A | Discharge status: Home / Self Care | Payer: 59 | Source: Ambulatory Visit | Attending: Cardiology | Admitting: Cardiology

## 2019-08-30 VITALS — BP 109/63 | HR 102 | Wt 155.8 lb

## 2019-08-30 DIAGNOSIS — E119 Type 2 diabetes mellitus without complications: Secondary | ICD-10-CM | POA: Diagnosis not present

## 2019-08-30 DIAGNOSIS — Z825 Family history of asthma and other chronic lower respiratory diseases: Secondary | ICD-10-CM | POA: Diagnosis not present

## 2019-08-30 DIAGNOSIS — Z8249 Family history of ischemic heart disease and other diseases of the circulatory system: Secondary | ICD-10-CM | POA: Diagnosis not present

## 2019-08-30 DIAGNOSIS — Z888 Allergy status to other drugs, medicaments and biological substances status: Secondary | ICD-10-CM | POA: Insufficient documentation

## 2019-08-30 DIAGNOSIS — Z8349 Family history of other endocrine, nutritional and metabolic diseases: Secondary | ICD-10-CM | POA: Diagnosis not present

## 2019-08-30 DIAGNOSIS — K219 Gastro-esophageal reflux disease without esophagitis: Secondary | ICD-10-CM | POA: Diagnosis not present

## 2019-08-30 DIAGNOSIS — Z7982 Long term (current) use of aspirin: Secondary | ICD-10-CM | POA: Insufficient documentation

## 2019-08-30 DIAGNOSIS — Z887 Allergy status to serum and vaccine status: Secondary | ICD-10-CM | POA: Diagnosis not present

## 2019-08-30 DIAGNOSIS — Z79899 Other long term (current) drug therapy: Secondary | ICD-10-CM | POA: Insufficient documentation

## 2019-08-30 DIAGNOSIS — I340 Nonrheumatic mitral (valve) insufficiency: Secondary | ICD-10-CM | POA: Insufficient documentation

## 2019-08-30 DIAGNOSIS — I5022 Chronic systolic (congestive) heart failure: Secondary | ICD-10-CM | POA: Insufficient documentation

## 2019-08-30 DIAGNOSIS — Z7984 Long term (current) use of oral hypoglycemic drugs: Secondary | ICD-10-CM | POA: Diagnosis not present

## 2019-08-30 DIAGNOSIS — Z833 Family history of diabetes mellitus: Secondary | ICD-10-CM | POA: Diagnosis not present

## 2019-08-30 DIAGNOSIS — I5032 Chronic diastolic (congestive) heart failure: Secondary | ICD-10-CM

## 2019-08-30 DIAGNOSIS — I428 Other cardiomyopathies: Secondary | ICD-10-CM | POA: Insufficient documentation

## 2019-08-30 DIAGNOSIS — I5042 Chronic combined systolic (congestive) and diastolic (congestive) heart failure: Secondary | ICD-10-CM

## 2019-08-30 LAB — BASIC METABOLIC PANEL WITH GFR
Anion gap: 10 (ref 5–15)
BUN: 24 mg/dL — ABNORMAL HIGH (ref 6–20)
CO2: 27 mmol/L (ref 22–32)
Calcium: 9.5 mg/dL (ref 8.9–10.3)
Chloride: 99 mmol/L (ref 98–111)
Creatinine, Ser: 0.92 mg/dL (ref 0.44–1.00)
GFR calc Af Amer: >60 mL/min
GFR calc non Af Amer: >60 mL/min
Glucose, Bld: 116 mg/dL — ABNORMAL HIGH (ref 70–99)
Potassium: 3.6 mmol/L (ref 3.5–5.1)
Sodium: 136 mmol/L (ref 135–145)

## 2019-08-30 NOTE — Patient Instructions (Addendum)
No medication changes!   Labs today We will only contact you if something comes back abnormal or we need to make some changes. Otherwise no news is good news!   Your physician recommends that you schedule a follow-up appointment in: 3 months with Dr Aundra Dubin   Your physician has requested that you have an echocardiogram. Echocardiography is a painless test that uses sound waves to create images of your heart. It provides your doctor with information about the size and shape of your heart and how well your heart's chambers and valves are working. This procedure takes approximately one hour. There are no restrictions for this procedure.   Please call office at [phone] option 2 if you have any questions or concerns.    At the [facility], you and your health needs are our priority. As part of our continuing mission to provide you with exceptional heart care, we have created designated Provider Care Teams. These Care Teams include your primary Cardiologist (physician) and Advanced Practice Providers (APPs- Physician Assistants and Nurse Practitioners) who all work together to provide you with the care you need, when you need it.   You may see any of the following providers on your designated Care Team at your next follow up: Marland Kitchen Dr Glori Bickers . Dr [name] . Darrick Grinder, NP . Lyda Jester, PA . Audry Riles, PharmD   Please be sure to bring in all your medications bottles to every appointment.

## 2019-09-01 NOTE — Progress Notes (Signed)
Advanced Heart Failure Clinic Note   Referring Physician: PCP: [name], MD HF Cardiology: Dr. Aundra Dubin  HPI:  Emily [name] is a 59 y.o. female with Chronic systolic CHF, NICM, mitral regurgitation, DM2, GERD, HDL, and Obesity.    Admitted 11/16 - 06/21/18 with acute systolic CHF. Noted by be NICM by cMRI and cath. At least moderate, functional MR noted. Diuresed with IV lasix and meds adjusted as tolerated.  Echo in 3/20 showed EF 30-35%, diffuse hypokinesis, mild MR.  Echo in 7/20 showed EF up to 40-45%.    She returns for followup of CHF. Weight has been stable. She gets short of breath walking up stairs but no dyspnea walking on flat ground.  Not getting much exercise.  She has chronically slept on 3 pillows.  SBP generally in the 90s at home but only gets rare lightheadedness.   Labs (1/20): K 3.9, creatinine 0.97  Labs (3/20): K 3.9, creatinine 0.95 Labs (7/20): TSH normal, hgb 14.3 Labs (8/20): LDL 100, K 4.8, creatinine 0.95  ECG (personally reviewed): NSR, nonspecific T wave flattening, low voltage.   Review of systems complete and found to be negative unless listed in HPI.    Past Medical History 1. Chronic systolic CHF: No significant coronary disease on LHC in 11/19. RHC (11/19) with mean RA 6, PA 42/20, mean PCWP 25, CI 1.8, PVR 1.7 WU. cMRI (11/19) showed EF 28%, mildly decreased RV size, and no myocardial LGE.  - TEE 06/20/18: LVEF 25-30%, Normal RV size and mildly decreased function, Mod LAE, Small PFO, Mild TR, Trivial PI, moderate-severe MR but looked more severe visually. MR is central suggestive of functional MR.  - Echo (3/20): EF 30-35%, diffuse hypokinesis, mild MR.  - Echo (7/20): EF 40-45% 2. Mitral regurgitation: Moderate-severe functional MR by TEE in 11/19.  Echo (3/20) with only mild MR.  3. DM2 4. Hyperlipidemia.  5. GERD 6. Zio patch x 7 days (9/20): short runs SVT, NSVT (rare).   Current Outpatient Medications  Medication Sig  Dispense Refill  . acetaminophen (TYLENOL) 500 MG tablet Take 500 mg by mouth every 6 (six) hours as needed for moderate pain.    Marland Kitchen aspirin 81 MG tablet Take 81 mg by mouth daily.    . Blood Glucose Monitoring Suppl (ONE TOUCH ULTRA 2) w/Device KIT USE TO TEST UP TO 4 TIMES PER DAY. 1 each 1  . cholecalciferol (VITAMIN D) 1000 UNITS tablet Take 1,000 Units by mouth daily.    . famotidine (PEPCID) 20 MG tablet TAKE 1 TABLET BY MOUTH EVERY DAY 90 tablet 3  . FARXIGA 10 MG TABS tablet TAKE 1 TABLET BY MOUTH EVERY DAY 90 tablet 2  . furosemide (LASIX) 40 MG tablet Take 1 tablet (40 mg total) by mouth daily. 90 tablet 2  . glucose blood (ONE TOUCH ULTRA TEST) test strip USE TO TEST UP TO 4 TIMES A DAY. 100 each 11  . Guaifenesin (MUCINEX MAXIMUM STRENGTH) 1200 MG TB12 Take 1 tablet (1,200 mg total) by mouth every 12 (twelve) hours as needed. 14 tablet 1  . metFORMIN (GLUCOPHAGE) 850 MG tablet TAKE 1 TABLET BY MOUTH TWICE A DAY WITH A MEAL 180 tablet 1  . metoprolol succinate (TOPROL-XL) 25 MG 24 hr tablet Take 1 tablet (25 mg total) by mouth 2 (two) times daily. 180 tablet 3  . Multiple Vitamins-Minerals (MULTIVITAMIN WITH MINERALS) tablet Take 1 tablet by mouth daily.    . nitroGLYCERIN (NITROSTAT) 0.4 MG SL tablet Place  1 tablet (0.4 mg total) under the tongue every 5 (five) minutes as needed for chest pain. 25 tablet 1  . potassium chloride SA (KLOR-CON M20) 20 MEQ tablet 1 tablet (20 mEq total) daily 90 tablet 3  . sacubitril-valsartan (ENTRESTO) 24-26 MG Take 1 tablet by mouth 2 (two) times daily. 180 tablet 2  . spironolactone (ALDACTONE) 25 MG tablet Take 1 tablet (25 mg total) by mouth daily. 90 tablet 3  . thiamine (VITAMIN B-1) 100 MG tablet Take 100 mg by mouth daily.     No current facility-administered medications for this encounter.    Allergies  Allergen Reactions  . Hornet Venom Swelling  . Metoprolol     depression  . Tetanus Toxoids Other (See Comments)    Big red rash and  swelling in the muscle      Social History   Socioeconomic History  . Marital status: Married    Spouse name: Not on file  . Number of children: 5  . Years of education: Not on file  . Highest education level: Master's degree (e.g., MA, MS, MEng, MEd, MSW, MBA)  Occupational History  . 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 Smoking status: Never Smoker  . Smokeless tobacco: Never Used  Substance and Sexual Activity  . Alcohol use: No  . Drug use: No  . Sexual activity: Yes  Other Topics Concern  . Not on file  Social History Narrative   Married.  Education: [employer].         Social Determinants of Health   Financial Resource Strain:   . Difficulty of Paying Living Expenses: Not on file  Food Insecurity:   . Worried About Charity fundraiser in the Last Year: Not on file  . Ran Out of Food in the Last Year: Not on file  Transportation Needs:   . Lack of Transportation (Medical): Not on file  . Lack of Transportation (Non-Medical): Not on file  Physical Activity:   . Days of Exercise per Week: Not on file  . Minutes of Exercise per Session: Not on file  Stress:   . Feeling of Stress : Not on file  Social Connections:   . Frequency of Communication with Friends and Family: Not on file  . Frequency of Social Gatherings with Friends and Family: Not on file  . Attends Religious Services: Not on file  . Active Member of Clubs or Organizations: Not on file  . Attends Archivist Meetings: Not on file  . Marital Status: Not on file  Intimate Partner Violence:   . Fear of Current or Ex-Partner: Not on file  . Emotionally Abused: Not on file  . Physically Abused: Not on file  . Sexually Abused: Not on file      Family History  Problem Relation Age of Onset  . Heart disease Mother   . COPD Mother   . Emphysema Mother   . Cancer Mother        oral  . Heart disease Father   . Diabetes Father   . COPD Father   . Vision loss Father   . Skin cancer Father         skin  . Hyperlipidemia Father   . Hypertension Father   . Heart disease Sister   . Graves' disease Sister   . Diabetes Sister   . Hyperlipidemia Sister   . Heart failure Sister 44  . Heart disease Brother   . Hyperlipidemia Brother   .  Cancer Paternal Grandmother        unknown  . Prostate cancer Paternal Grandfather        Prostate cancer  . Breast cancer Neg Hx     Vitals:   08/30/19 1419  BP: 109/63  Pulse: (!) 102  SpO2: 95%  Weight: 70.7 kg (155 lb 12.8 oz)    Wt Readings from Last 3 Encounters:  08/30/19 70.7 kg (155 lb 12.8 oz)  04/22/19 71.8 kg (158 lb 3.2 oz)  02/08/19 66.9 kg (147 lb 6.4 oz)     PHYSICAL EXAM: General: NAD Neck: No JVD, no thyromegaly or thyroid nodule.  Lungs: Clear to auscultation bilaterally with normal respiratory effort. CV: Nondisplaced PMI.  Heart regular S1/S2, no S3/S4, no murmur.  No peripheral edema.  No carotid bruit.  Normal pedal pulses.  Abdomen: Soft, nontender, no hepatosplenomegaly, no distention.  Skin: Intact without lesions or rashes.  Neurologic: Alert and oriented x 3.  Psych: Normal affect. Extremities: No clubbing or cyanosis.  HEENT: Normal.   ASSESSMENT & PLAN:  1. Chronic systolic CHF: Nonischemic cardiomyopathy by [facility] 06/2018. cMRI showed EF 28%, mildly decreased RV size, and no myocardial LGE. Echo in 7/20 showed EF up to 40-45%.  NYHA class II symptoms, she is not volume overloaded on exam.  No BP room for medication titration with SBP in 90s at home.  - Continue Lasix 40 mg daily, BMET today.  - Continue Toprol XL 25 mg bid.  - Continue Entresto 24/26 mg BID - Continue spironolactone 25 mg daily.  - Continue Farxiga 10 mg daily (she has co-existing diabetes).  - EF is out of ICD range.  - Echo at followup in 3 months.  2. Mitral regurgitation: Only mild on 7/20 echo.    Followup in 3 months with echo.    [name] 09/01/2019

## 2019-10-16 IMAGING — MR MR CARD MORPHOLOGY WO/W CM
9 of 11 series · 38 of 40 positions shown · IV contrast (gadavist)
Comparison: none

CLINICAL DATA: Cardiomyopathy of uncertain etiology.

EXAM:
CARDIAC MRI
TECHNIQUE: The patient was scanned on a 1.5 Tesla GE magnet. A dedicated
cardiac coil was used. Functional imaging was done using Fiesta
sequences. [DATE], and 4 chamber views were done to assess for RWMA's.
Modified Milton rule using a short axis stack was used to
calculate an ejection fraction on a dedicated work station using
Circle software. The patient received 7.5 cc of Gadavist. After 10
minutes inversion recovery sequences were used to assess for
infiltration and scar tissue.
CONTRAST:  7.5 cc Gadavist

[Series 6: bSSFP · oblique · 8.0mm · 1.47mm/px · 27 of 425 slices shown (1 of 4)]
[im 1/425]
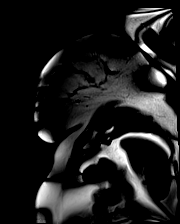
[im 17/425]
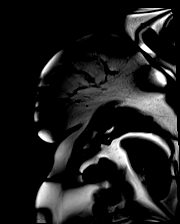
[im 33/425]
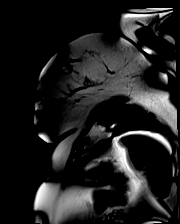
[im 49/425]
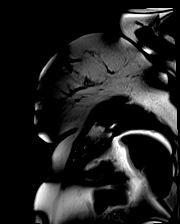
[im 66/425]
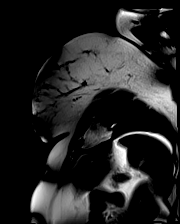
[im 82/425]
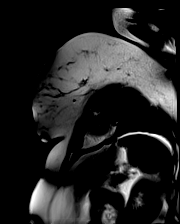
[im 98/425]
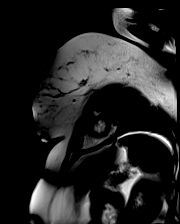
[im 115/425]
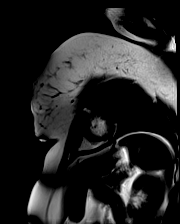
[im 131/425]
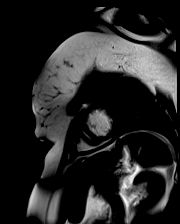
[im 147/425]
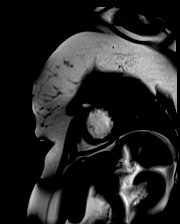
[im 164/425]
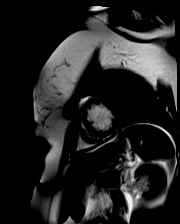
[im 180/425]
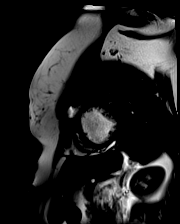
[im 196/425]
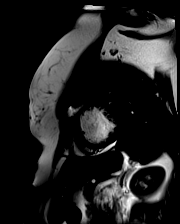
[im 213/425]
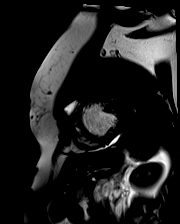
[im 229/425]
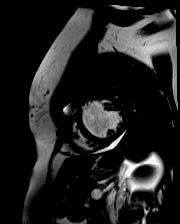
[im 245/425]
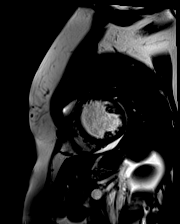
[im 261/425]
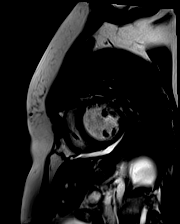
[im 278/425]
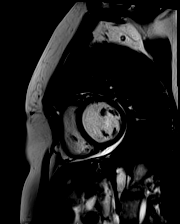
[im 294/425]
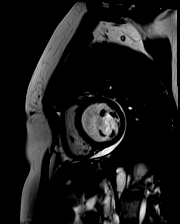
[im 310/425]
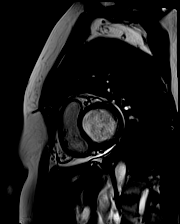
[im 327/425]
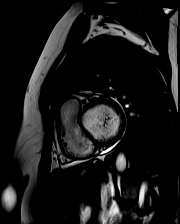
[im 343/425]
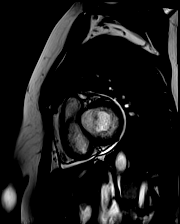
[im 359/425]
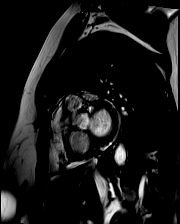
[im 376/425]
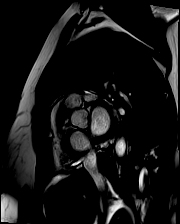
[im 392/425]
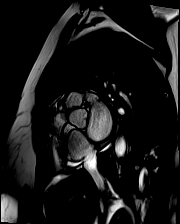
[im 408/425]
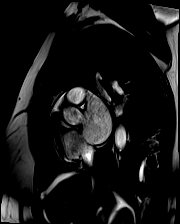
[im 425/425]
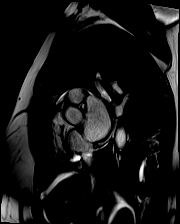

[Series 8: bSSFP · oblique · 6.0mm · 1.29mm/px · 2 of 25 slices shown (2 of 4)]
[im 1/25]
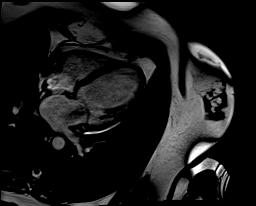
[im 25/25]
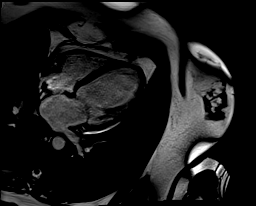

[Series 9: bSSFP · coronal · 6.0mm · 1.29mm/px · 2 of 25 slices shown (3 of 4)]
[im 1/25]
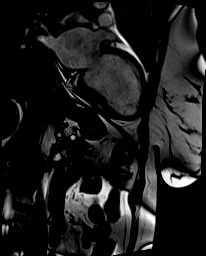
[im 25/25]
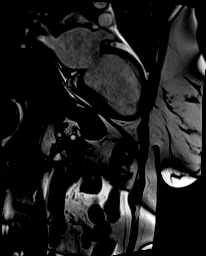

[Series 10: t2_stir_db_sax · oblique · 8.0mm · 1.54mm/px · 1 of 17 slices shown]
[im 1/17]
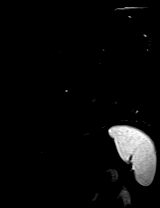

[Series 12: bSSFP · oblique · 6.0mm · 1.29mm/px · 2 of 25 slices shown (4 of 4)]
[im 1/25]
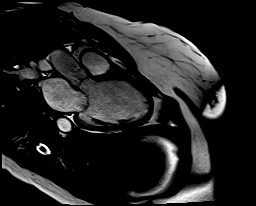
[im 25/25]
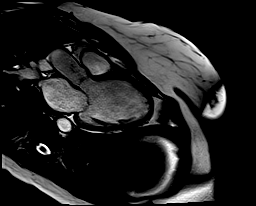

[Series 14: lge_single shot sa · oblique · 8.0mm · 1.67mm/px · 1 of 17 slices shown (1 of 2)]
[im 1/17]
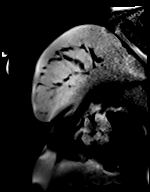

[Series 15: lge_single shot sa · oblique · 8.0mm · 1.67mm/px · 1 of 17 slices shown (2 of 2)]
[im 1/17]
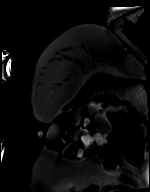

[Series 24: lge short axis_mag · oblique · 8.0mm · 1.50mm/px · 1 of 17 slices shown]
[im 1/17]
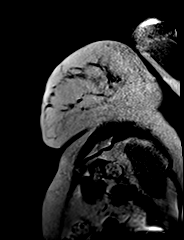

[Series 25: lge short axis_psir · oblique · 8.0mm · 1.50mm/px · 1 of 17 slices shown]
[im 1/17]
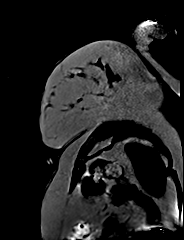

[38 of 40 positions shown; findings below may reference images not displayed]

FINDINGS: Limited images of the lung fields showed no gross abnormalities.

Trivial pericardial effusion.  Prominent epicardial fat.

Mildly dilated left ventricle with normal wall thickness. LV
morphology did not suggest noncompaction. Diffuse LV hypokinesis
with EF 28%. Normal right ventricular size with mildly decreased
systolic function, EF 38%. Moderate left atrial enlargement. Normal
right atrium. Trileaflet aortic valve with no regurgitation or
stenosis. There was at least moderate mitral regurgitation present.
Flow sequences to quantify MR were not done.

Delayed enhancement images were low quality. However, no definite
myocardial late gadolinium enhancement (LGE) was detected.

Measurements:

LVEDV 197 mL

LVSV 55 mL
LVEF 28%

RVEDV 90 mL

RVSV 35 mL
RVEF 38%
IMPRESSION: 1.  Mildly dilated LV with EF 28%, diffuse hypokinesis.

2.  Normal RV size with mildly decreased systolic function, EF 38%.

3. No myocardial LGE, so no definitive evidence for prior
myocarditis, infiltrative disease, or MI.

4. At least moderate mitral regurgitation. This was evaluated by CHUY
today.

Deeqa Rayaan Adlaho

## 2019-10-18 ENCOUNTER — Other Ambulatory Visit: Payer: Self-pay | Admitting: Cardiovascular Disease

## 2019-10-21 ENCOUNTER — Other Ambulatory Visit (HOSPITAL_COMMUNITY): Payer: Self-pay | Admitting: *Deleted

## 2019-10-21 MED ORDER — ENTRESTO 24-26 MG PO TABS: 1.0000 | ORAL_TABLET | Freq: Two times a day (BID) | ORAL | 2 refills | Status: DC

## 2019-11-05 ENCOUNTER — Other Ambulatory Visit (HOSPITAL_COMMUNITY): Payer: Self-pay | Admitting: Cardiology

## 2019-11-28 ENCOUNTER — Encounter (HOSPITAL_COMMUNITY): Payer: Self-pay | Admitting: Cardiology

## 2019-11-28 ENCOUNTER — Ambulatory Visit (HOSPITAL_COMMUNITY)
Admission: RE | Admit: 2019-11-28 | Discharge: 2019-11-28 | Disposition: A | Discharge status: Home / Self Care | Payer: 59 | Source: Ambulatory Visit | Attending: Family Medicine | Admitting: Family Medicine

## 2019-11-28 ENCOUNTER — Other Ambulatory Visit: Payer: Self-pay

## 2019-11-28 ENCOUNTER — Ambulatory Visit (HOSPITAL_BASED_OUTPATIENT_CLINIC_OR_DEPARTMENT_OTHER)
Admission: RE | Admit: 2019-11-28 | Discharge: 2019-11-28 | Disposition: A | Discharge status: Home / Self Care | Payer: 59 | Source: Ambulatory Visit | Attending: Cardiology | Admitting: Cardiology

## 2019-11-28 VITALS — BP 82/52 | HR 94 | Wt 154.0 lb

## 2019-11-28 DIAGNOSIS — I428 Other cardiomyopathies: Secondary | ICD-10-CM | POA: Insufficient documentation

## 2019-11-28 DIAGNOSIS — E119 Type 2 diabetes mellitus without complications: Secondary | ICD-10-CM | POA: Diagnosis not present

## 2019-11-28 DIAGNOSIS — Z8616 Personal history of COVID-19: Secondary | ICD-10-CM | POA: Diagnosis not present

## 2019-11-28 DIAGNOSIS — I340 Nonrheumatic mitral (valve) insufficiency: Secondary | ICD-10-CM | POA: Insufficient documentation

## 2019-11-28 DIAGNOSIS — Z8249 Family history of ischemic heart disease and other diseases of the circulatory system: Secondary | ICD-10-CM | POA: Insufficient documentation

## 2019-11-28 DIAGNOSIS — I5032 Chronic diastolic (congestive) heart failure: Secondary | ICD-10-CM

## 2019-11-28 DIAGNOSIS — I5022 Chronic systolic (congestive) heart failure: Secondary | ICD-10-CM

## 2019-11-28 DIAGNOSIS — E669 Obesity, unspecified: Secondary | ICD-10-CM | POA: Insufficient documentation

## 2019-11-28 DIAGNOSIS — K219 Gastro-esophageal reflux disease without esophagitis: Secondary | ICD-10-CM | POA: Diagnosis not present

## 2019-11-28 DIAGNOSIS — Z7982 Long term (current) use of aspirin: Secondary | ICD-10-CM | POA: Diagnosis not present

## 2019-11-28 DIAGNOSIS — Z79899 Other long term (current) drug therapy: Secondary | ICD-10-CM | POA: Insufficient documentation

## 2019-11-28 DIAGNOSIS — E785 Hyperlipidemia, unspecified: Secondary | ICD-10-CM | POA: Insufficient documentation

## 2019-11-28 DIAGNOSIS — Z7901 Long term (current) use of anticoagulants: Secondary | ICD-10-CM | POA: Diagnosis not present

## 2019-11-28 DIAGNOSIS — Z7984 Long term (current) use of oral hypoglycemic drugs: Secondary | ICD-10-CM | POA: Insufficient documentation

## 2019-11-28 LAB — BASIC METABOLIC PANEL
Anion gap: 12 (ref 5–15)
BUN: 19 mg/dL (ref 6–20)
CO2: 27 mmol/L (ref 22–32)
Calcium: 9.5 mg/dL (ref 8.9–10.3)
Chloride: 101 mmol/L (ref 98–111)
Creatinine, Ser: 1.01 mg/dL — ABNORMAL HIGH (ref 0.44–1.00)
GFR calc Af Amer: >60 mL/min (ref >60)
GFR calc non Af Amer: >60 mL/min (ref >60)
Glucose, Bld: 117 mg/dL — ABNORMAL HIGH (ref 70–99)
Potassium: 3.9 mmol/L (ref 3.5–5.1)
Sodium: 140 mmol/L (ref 135–145)

## 2019-11-28 MED ORDER — FUROSEMIDE 20 MG PO TABS: 20.0000 mg | ORAL_TABLET | Freq: Every day | ORAL | 6 refills | Status: DC

## 2019-11-28 NOTE — Patient Instructions (Signed)
Decrease Furosemide to 20 mg daily  STOP Potassium  Labs done today, your results will be available in MyChart, we will contact you for abnormal readings.  Please call our office October to schedule your 6 month follow up  If you have any questions or concerns before your next appointment please send [place] a message through [place] or call our office at [phone].  At the [facility], you and your health needs are our priority. As part of our continuing mission to provide you with exceptional heart care, we have created designated Provider Care Teams. These Care Teams include your primary Cardiologist (physician) and Advanced Practice Providers (APPs- Physician Assistants and Nurse Practitioners) who all work together to provide you with the care you need, when you need it.   You may see any of the following providers on your designated Care Team at your next follow up: Marland Kitchen Dr Glori Bickers . Dr [name] . Darrick Grinder, NP . Lyda Jester, PA . Audry Riles, PharmD   Please be sure to bring in all your medications bottles to every appointment.

## 2019-11-28 NOTE — Progress Notes (Signed)
  Echocardiogram 2D Echocardiogram has been performed.  Emily [name] 11/28/2019, 1:37 PM

## 2019-11-29 NOTE — Progress Notes (Signed)
Advanced Heart Failure Clinic Note   Referring Physician: PCP: [name], MD HF Cardiology: Dr. Aundra Dubin  HPI:  Emily [name] is a 59 y.o. female with Chronic systolic CHF, NICM, mitral regurgitation, DM2, GERD, HDL, and Obesity.    Admitted 11/16 - 06/21/18 with acute systolic CHF. Noted by be NICM by cMRI and cath. At least moderate, functional MR noted. Diuresed with IV lasix and meds adjusted as tolerated.  Echo in 3/20 showed EF 30-35%, diffuse hypokinesis, mild MR.  Echo in 7/20 showed EF up to 40-45%.  Echo was done today and reviewed, EF up to 50% with normal RV.   She returns for followup of CHF. Weight is down 1 lb.  SBP runs chronically in the 90s range.  She tolerates her meds without lightheadedness, falls, or unsteadiness.  Some fatigue but attributes this to COVID-19 infection (had in 2/21).  No significant exertional dyspnea.   Labs (1/20): K 3.9, creatinine 0.97  Labs (3/20): K 3.9, creatinine 0.95 Labs (7/20): TSH normal, hgb 14.3 Labs (8/20): LDL 100, K 4.8, creatinine 0.95 Labs (1/21): K 3.6, creatinine 0.92  Review of systems complete and found to be negative unless listed in HPI.    Past Medical History 1. Chronic systolic CHF: No significant coronary disease on LHC in 11/19. RHC (11/19) with mean RA 6, PA 42/20, mean PCWP 25, CI 1.8, PVR 1.7 WU. cMRI (11/19) showed EF 28%, mildly decreased RV size, and no myocardial LGE.  - TEE 06/20/18: LVEF 25-30%, Normal RV size and mildly decreased function, Mod LAE, Small PFO, Mild TR, Trivial PI, moderate-severe MR but looked more severe visually. MR is central suggestive of functional MR.  - Echo (3/20): EF 30-35%, diffuse hypokinesis, mild MR.  - Echo (7/20): EF 40-45% - Echo (4/21): EF 50%, normal RV.  2. Mitral regurgitation: Moderate-severe functional MR by TEE in 11/19.  Echo (4/21) with only mild MR.  3. DM2 4. Hyperlipidemia.  5. GERD 6. Zio patch x 7 days (9/20): short runs SVT, NSVT (rare).  7.  COVID-19 infection: 2/21.   Current Outpatient Medications  Medication Sig Dispense Refill  . acetaminophen (TYLENOL) 500 MG tablet Take 500 mg by mouth every 6 (six) hours as needed for moderate pain.    Marland Kitchen aspirin 81 MG tablet Take 81 mg by mouth daily.    . Blood Glucose Monitoring Suppl (ONE TOUCH ULTRA 2) w/Device KIT USE TO TEST UP TO 4 TIMES PER DAY. 1 each 1  . cholecalciferol (VITAMIN D) 1000 UNITS tablet Take 1,000 Units by mouth daily.    . famotidine (PEPCID) 20 MG tablet TAKE 1 TABLET BY MOUTH EVERY DAY 90 tablet 3  . FARXIGA 10 MG TABS tablet TAKE 1 TABLET BY MOUTH EVERY DAY 90 tablet 2  . furosemide (LASIX) 20 MG tablet Take 1 tablet (20 mg total) by mouth daily. 30 tablet 6  . glucose blood (ONE TOUCH ULTRA TEST) test strip USE TO TEST UP TO 4 TIMES A DAY. 100 each 11  . Guaifenesin (MUCINEX MAXIMUM STRENGTH) 1200 MG TB12 Take 1 tablet (1,200 mg total) by mouth every 12 (twelve) hours as needed. 14 tablet 1  . metFORMIN (GLUCOPHAGE) 850 MG tablet TAKE 1 TABLET BY MOUTH TWICE A DAY WITH A MEAL 180 tablet 1  . metoprolol succinate (TOPROL-XL) 25 MG 24 hr tablet TAKE 1 TABLET BY MOUTH TWICE A DAY 180 tablet 3  . Multiple Vitamins-Minerals (MULTIVITAMIN WITH MINERALS) tablet Take 1 tablet by mouth daily.    Marland Kitchen  nitroGLYCERIN (NITROSTAT) 0.4 MG SL tablet Place 1 tablet (0.4 mg total) under the tongue every 5 (five) minutes as needed for chest pain. 25 tablet 1  . sacubitril-valsartan (ENTRESTO) 24-26 MG Take 1 tablet by mouth 2 (two) times daily. 180 tablet 2  . spironolactone (ALDACTONE) 25 MG tablet Take 1 tablet (25 mg total) by mouth daily. 90 tablet 3  . thiamine (VITAMIN B-1) 100 MG tablet Take 100 mg by mouth daily.     No current facility-administered medications for this encounter.    Allergies  Allergen Reactions  . Hornet Venom Swelling  . Metoprolol     depression  . Tetanus Toxoids Other (See Comments)    Big red rash and swelling in the muscle      Social  History   Socioeconomic History  . Marital status: Married    Spouse name: Not on file  . Number of children: 5  . Years of education: Not on file  . Highest education level: Master's degree (e.g., MA, MS, MEng, MEd, MSW, MBA)  Occupational History  . 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 Smoking status: Never Smoker  . Smokeless tobacco: Never Used  Substance and Sexual Activity  . Alcohol use: No  . Drug use: No  . Sexual activity: Yes  Other Topics Concern  . Not on file  Social History Narrative   Married.  Education: [employer].         Social Determinants of Health   Financial Resource Strain:   . Difficulty of Paying Living Expenses:   Food Insecurity:   . Worried About Charity fundraiser in the Last Year:   . Arboriculturist in the Last Year:   Transportation Needs:   . Film/video editor (Medical):   Marland Kitchen Lack of Transportation (Non-Medical):   Physical Activity:   . Days of Exercise per Week:   . Minutes of Exercise per Session:   Stress:   . Feeling of Stress :   Social Connections:   . Frequency of Communication with Friends and Family:   . Frequency of Social Gatherings with Friends and Family:   . Attends Religious Services:   . Active Member of Clubs or Organizations:   . Attends Archivist Meetings:   Marland Kitchen Marital Status:   Intimate Partner Violence:   . Fear of Current or Ex-Partner:   . Emotionally Abused:   Marland Kitchen Physically Abused:   . Sexually Abused:       Family History  Problem Relation Age of Onset  . Heart disease Mother   . COPD Mother   . Emphysema Mother   . Cancer Mother        oral  . Heart disease Father   . Diabetes Father   . COPD Father   . Vision loss Father   . Skin cancer Father        skin  . Hyperlipidemia Father   . Hypertension Father   . Heart disease Sister   . Graves' disease Sister   . Diabetes Sister   . Hyperlipidemia Sister   . Heart failure Sister 47  . Heart disease Brother   . Hyperlipidemia  Brother   . Cancer Paternal Grandmother        unknown  . Prostate cancer Paternal Grandfather        Prostate cancer  . Breast cancer Neg Hx     Vitals:   11/28/19 1404  BP: (!) 82/52  Pulse: 94  SpO2: 99%  Weight: 69.9 kg (154 lb)    Wt Readings from Last 3 Encounters:  11/28/19 69.9 kg (154 lb)  08/30/19 70.7 kg (155 lb 12.8 oz)  04/22/19 71.8 kg (158 lb 3.2 oz)     PHYSICAL EXAM: General: NAD Neck: No JVD, no thyromegaly or thyroid nodule.  Lungs: Clear to auscultation bilaterally with normal respiratory effort. CV: Nondisplaced PMI.  Heart regular S1/S2, no S3/S4, no murmur.  No peripheral edema.  No carotid bruit.  Normal pedal pulses.  Abdomen: Soft, nontender, no hepatosplenomegaly, no distention.  Skin: Intact without lesions or rashes.  Neurologic: Alert and oriented x 3.  Psych: Normal affect. Extremities: No clubbing or cyanosis.  HEENT: Normal.   ASSESSMENT & PLAN:  1. Chronic systolic CHF: Nonischemic cardiomyopathy by [facility] 06/2018. cMRI showed EF 28%, mildly decreased RV size, and no myocardial LGE. Echo in 7/20 showed EF up to 40-45% and echo today showed EF up to 50%.  NYHA class II symptoms, she is not volume overloaded on exam.  No BP room for medication titration with SBP in 90s generally.  - Decrease Lasix to 20 mg daily and stop KCl.   - Continue Toprol XL 25 mg bid.  - Continue Entresto 24/26 mg BID - Continue spironolactone 25 mg daily.  - Continue Farxiga 10 mg daily (she has co-existing diabetes).  - EF is out of ICD range.  - BMET today and again in 3 months.   2. Mitral regurgitation: Only mild on 4/21 echo.    Followup in 6 months.     [name] 11/29/2019

## 2019-12-18 ENCOUNTER — Other Ambulatory Visit (HOSPITAL_COMMUNITY): Payer: Self-pay

## 2019-12-18 MED ORDER — SPIRONOLACTONE 25 MG PO TABS: 25.0000 mg | ORAL_TABLET | Freq: Every day | ORAL | 3 refills | Status: DC

## 2020-01-31 ENCOUNTER — Other Ambulatory Visit: Payer: Self-pay | Admitting: Cardiovascular Disease

## 2020-02-24 ENCOUNTER — Other Ambulatory Visit: Payer: Self-pay | Admitting: Cardiovascular Disease

## 2020-02-26 NOTE — Telephone Encounter (Signed)
Rx(s) sent to pharmacy electronically.  

## 2020-02-27 ENCOUNTER — Ambulatory Visit (HOSPITAL_COMMUNITY)
Admission: RE | Admit: 2020-02-27 | Discharge: 2020-02-27 | Disposition: A | Discharge status: Home / Self Care | Payer: 59 | Source: Ambulatory Visit | Attending: Internal Medicine | Admitting: Internal Medicine

## 2020-02-27 ENCOUNTER — Other Ambulatory Visit: Payer: Self-pay

## 2020-02-27 ENCOUNTER — Other Ambulatory Visit (HOSPITAL_COMMUNITY): Payer: Self-pay | Admitting: *Deleted

## 2020-02-27 DIAGNOSIS — I5022 Chronic systolic (congestive) heart failure: Secondary | ICD-10-CM

## 2020-02-27 DIAGNOSIS — I5042 Chronic combined systolic (congestive) and diastolic (congestive) heart failure: Secondary | ICD-10-CM | POA: Diagnosis not present

## 2020-02-27 DIAGNOSIS — I5032 Chronic diastolic (congestive) heart failure: Secondary | ICD-10-CM

## 2020-02-27 LAB — BASIC METABOLIC PANEL
Anion gap: 11 (ref 5–15)
BUN: 21 mg/dL — ABNORMAL HIGH (ref 6–20)
CO2: 24 mmol/L (ref 22–32)
Calcium: 9.6 mg/dL (ref 8.9–10.3)
Chloride: 102 mmol/L (ref 98–111)
Creatinine, Ser: 0.89 mg/dL (ref 0.44–1.00)
GFR calc Af Amer: >60 mL/min (ref >60)
GFR calc non Af Amer: >60 mL/min (ref >60)
Glucose, Bld: 203 mg/dL — ABNORMAL HIGH (ref 70–99)
Potassium: 3.9 mmol/L (ref 3.5–5.1)
Sodium: 137 mmol/L (ref 135–145)

## 2020-03-21 ENCOUNTER — Other Ambulatory Visit (HOSPITAL_COMMUNITY): Payer: Self-pay | Admitting: Cardiology

## 2020-04-13 ENCOUNTER — Other Ambulatory Visit: Payer: Self-pay | Admitting: Cardiovascular Disease

## 2020-05-20 ENCOUNTER — Ambulatory Visit (HOSPITAL_COMMUNITY)
Admission: RE | Admit: 2020-05-20 | Discharge: 2020-05-20 | Disposition: A | Discharge status: Home / Self Care | Payer: 59 | Source: Ambulatory Visit | Attending: Cardiology | Admitting: Cardiology

## 2020-05-20 ENCOUNTER — Other Ambulatory Visit: Payer: Self-pay

## 2020-05-20 ENCOUNTER — Encounter (HOSPITAL_COMMUNITY): Payer: Self-pay | Admitting: Cardiology

## 2020-05-20 VITALS — BP 104/70 | HR 93 | Wt 156.4 lb

## 2020-05-20 DIAGNOSIS — I5022 Chronic systolic (congestive) heart failure: Secondary | ICD-10-CM | POA: Diagnosis not present

## 2020-05-20 DIAGNOSIS — Z7984 Long term (current) use of oral hypoglycemic drugs: Secondary | ICD-10-CM | POA: Insufficient documentation

## 2020-05-20 DIAGNOSIS — E785 Hyperlipidemia, unspecified: Secondary | ICD-10-CM | POA: Insufficient documentation

## 2020-05-20 DIAGNOSIS — Z8616 Personal history of COVID-19: Secondary | ICD-10-CM | POA: Diagnosis not present

## 2020-05-20 DIAGNOSIS — I340 Nonrheumatic mitral (valve) insufficiency: Secondary | ICD-10-CM | POA: Diagnosis not present

## 2020-05-20 DIAGNOSIS — Z79899 Other long term (current) drug therapy: Secondary | ICD-10-CM | POA: Insufficient documentation

## 2020-05-20 DIAGNOSIS — E7800 Pure hypercholesterolemia, unspecified: Secondary | ICD-10-CM

## 2020-05-20 DIAGNOSIS — K219 Gastro-esophageal reflux disease without esophagitis: Secondary | ICD-10-CM | POA: Insufficient documentation

## 2020-05-20 DIAGNOSIS — I428 Other cardiomyopathies: Secondary | ICD-10-CM | POA: Insufficient documentation

## 2020-05-20 DIAGNOSIS — Z7982 Long term (current) use of aspirin: Secondary | ICD-10-CM | POA: Insufficient documentation

## 2020-05-20 DIAGNOSIS — E669 Obesity, unspecified: Secondary | ICD-10-CM | POA: Insufficient documentation

## 2020-05-20 DIAGNOSIS — E119 Type 2 diabetes mellitus without complications: Secondary | ICD-10-CM | POA: Diagnosis not present

## 2020-05-20 DIAGNOSIS — I5032 Chronic diastolic (congestive) heart failure: Secondary | ICD-10-CM | POA: Diagnosis not present

## 2020-05-20 DIAGNOSIS — Z8249 Family history of ischemic heart disease and other diseases of the circulatory system: Secondary | ICD-10-CM | POA: Diagnosis not present

## 2020-05-20 LAB — BASIC METABOLIC PANEL
Anion gap: 13 (ref 5–15)
BUN: 24 mg/dL — ABNORMAL HIGH (ref 6–20)
CO2: 26 mmol/L (ref 22–32)
Calcium: 9.9 mg/dL (ref 8.9–10.3)
Chloride: 99 mmol/L (ref 98–111)
Creatinine, Ser: 1.01 mg/dL — ABNORMAL HIGH (ref 0.44–1.00)
GFR, Estimated: >60 mL/min (ref >60)
Glucose, Bld: 144 mg/dL — ABNORMAL HIGH (ref 70–99)
Potassium: 3.9 mmol/L (ref 3.5–5.1)
Sodium: 138 mmol/L (ref 135–145)

## 2020-05-20 MED ORDER — ROSUVASTATIN CALCIUM 5 MG PO TABS: 5.0000 mg | ORAL_TABLET | Freq: Every day | ORAL | 11 refills | Status: DC

## 2020-05-20 NOTE — Patient Instructions (Signed)
Labs done today. We will contact you only if your labs are abnormal.  START Crestor 5mg (1 tablet) by mouth daily.  No other medication changes were made. Please continue all other medications as prescribed.  Your physician recommends that you schedule a follow-up appointment in: 2 months for a lab only appointment and in 6 months for an appointment with Dr. Aundra Dubin with an echo prior to your appointment.   Your physician has requested that you have an echocardiogram. Echocardiography is a painless test that uses sound waves to create images of your heart. It provides your doctor with information about the size and shape of your heart and how well your heart's chambers and valves are working. This procedure takes approximately one hour. There are no restrictions for this procedure.   If you have any questions or concerns before your next appointment please send [place] a message through [facility] or call our office at [phone].    TO LEAVE A MESSAGE FOR THE NURSE SELECT OPTION 2, PLEASE LEAVE A MESSAGE INCLUDING: . YOUR NAME . DATE OF BIRTH . CALL BACK NUMBER . REASON FOR CALL**this is important as we prioritize the call backs  [place] AS LONG AS YOU CALL BEFORE 4:00 PM   At the [facility], you and your health needs are our priority. As part of our continuing mission to provide you with exceptional heart care, we have created designated Provider Care Teams. These Care Teams include your primary Cardiologist (physician) and Advanced Practice Providers (APPs- Physician Assistants and Nurse Practitioners) who all work together to provide you with the care you need, when you need it.   You may see any of the following providers on your designated Care Team at your next follow up: Marland Kitchen Dr Glori Bickers . Dr [name] . Darrick Grinder, NP . Lyda Jester, PA . Audry Riles, PharmD   Please be sure to bring in all your medications bottles to  every appointment.

## 2020-05-20 NOTE — Progress Notes (Signed)
Advanced Heart Failure Clinic Note   Referring Physician: PCP: [name], MD HF Cardiology: Dr. Aundra Dubin  HPI:  Emily [name] is a 59 y.o. female with Chronic systolic CHF, NICM, mitral regurgitation, DM2, GERD, HDL, and Obesity.    Admitted 11/16 - 06/21/18 with acute systolic CHF. Noted by be NICM by cMRI and cath. At least moderate, functional MR noted. Diuresed with IV lasix and meds adjusted as tolerated.  Echo in 3/20 showed EF 30-35%, diffuse hypokinesis, mild MR.  Echo in 7/20 showed EF up to 40-45%.  Echo in 4/21 showed EF up to 50% with normal RV.   She returns for followup of CHF. Weight is up 2 lbs. No significant exertional dyspnea.  She does fatigue easily.  No orthopnea/PND.  No lightheadedness or palpitations. She has been off atorvastatin due to side effects, wants to restart a different statin.   ECG (personally reviewed): NSR, low voltage Ps, nonspecific T wave flattening   Labs (1/20): K 3.9, creatinine 0.97  Labs (3/20): K 3.9, creatinine 0.95 Labs (7/20): TSH normal, hgb 14.3 Labs (8/20): LDL 100, K 4.8, creatinine 0.95 Labs (1/21): K 3.6, creatinine 0.92 Labs (7/21): K 3.9, creatinine 0.89  Review of systems complete and found to be negative unless listed in HPI.    Past Medical History 1. Chronic systolic CHF: No significant coronary disease on LHC in 11/19. RHC (11/19) with mean RA 6, PA 42/20, mean PCWP 25, CI 1.8, PVR 1.7 WU. cMRI (11/19) showed EF 28%, mildly decreased RV size, and no myocardial LGE.  - TEE 06/20/18: LVEF 25-30%, Normal RV size and mildly decreased function, Mod LAE, Small PFO, Mild TR, Trivial PI, moderate-severe MR but looked more severe visually. MR is central suggestive of functional MR.  - Echo (3/20): EF 30-35%, diffuse hypokinesis, mild MR.  - Echo (7/20): EF 40-45% - Echo (4/21): EF 50%, normal RV.  2. Mitral regurgitation: Moderate-severe functional MR by TEE in 11/19.  Echo (4/21) with only mild MR.  3. DM2 4.  Hyperlipidemia.  5. GERD 6. Zio patch x 7 days (9/20): short runs SVT, NSVT (rare).  7. COVID-19 infection: 2/21.   Current Outpatient Medications  Medication Sig Dispense Refill  . acetaminophen (TYLENOL) 500 MG tablet Take 500 mg by mouth every 6 (six) hours as needed for moderate pain.    Marland Kitchen aspirin 81 MG tablet Take 81 mg by mouth daily.    . Blood Glucose Monitoring Suppl (ONE TOUCH ULTRA 2) w/Device KIT USE TO TEST UP TO 4 TIMES PER DAY. 1 each 1  . cholecalciferol (VITAMIN D) 1000 UNITS tablet Take 1,000 Units by mouth daily.    . famotidine (PEPCID) 20 MG tablet Take 1 tablet (20 mg total) by mouth daily. 30 tablet 0  . FARXIGA 10 MG TABS tablet TAKE 1 TABLET BY MOUTH EVERY DAY 90 tablet 3  . furosemide (LASIX) 20 MG tablet Take 1 tablet (20 mg total) by mouth daily. 30 tablet 6  . glucose blood (ONE TOUCH ULTRA TEST) test strip USE TO TEST UP TO 4 TIMES A DAY. 100 each 11  . Guaifenesin (MUCINEX MAXIMUM STRENGTH) 1200 MG TB12 Take 1 tablet (1,200 mg total) by mouth every 12 (twelve) hours as needed. 14 tablet 1  . metFORMIN (GLUCOPHAGE) 850 MG tablet TAKE 1 TABLET BY MOUTH TWICE A DAY WITH A MEAL 180 tablet 1  . metoprolol succinate (TOPROL-XL) 25 MG 24 hr tablet TAKE 1 TABLET BY MOUTH TWICE A DAY 180  tablet 3  . Multiple Vitamins-Minerals (MULTIVITAMIN WITH MINERALS) tablet Take 1 tablet by mouth daily.    . nitroGLYCERIN (NITROSTAT) 0.4 MG SL tablet Place 1 tablet (0.4 mg total) under the tongue every 5 (five) minutes as needed for chest pain. 25 tablet 1  . sacubitril-valsartan (ENTRESTO) 24-26 MG Take 1 tablet by mouth 2 (two) times daily. 180 tablet 2  . spironolactone (ALDACTONE) 25 MG tablet Take 1 tablet (25 mg total) by mouth daily. 90 tablet 3  . thiamine (VITAMIN B-1) 100 MG tablet Take 100 mg by mouth daily.    . rosuvastatin (CRESTOR) 5 MG tablet Take 1 tablet (5 mg total) by mouth daily. 30 tablet 11   No current facility-administered medications for this encounter.     Allergies  Allergen Reactions  . Hornet Venom Swelling  . Metoprolol     depression  . Tetanus Toxoids Other (See Comments)    Big red rash and swelling in the muscle      Social History   Socioeconomic History  . Marital status: Married    Spouse name: Not on file  . Number of children: 5  . Years of education: Not on file  . Highest education level: Master's degree (e.g., MA, MS, MEng, MEd, MSW, MBA)  Occupational History  . 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 Smoking status: Never Smoker  . Smokeless tobacco: Never Used  Vaping Use  . Vaping Use: Never used  Substance and Sexual Activity  . Alcohol use: No  . Drug use: No  . Sexual activity: Yes  Other Topics Concern  . Not on file  Social History Narrative   Married.  Education: [employer].         Social Determinants of Health   Financial Resource Strain:   . Difficulty of Paying Living Expenses: Not on file  Food Insecurity:   . Worried About Charity fundraiser in the Last Year: Not on file  . Ran Out of Food in the Last Year: Not on file  Transportation Needs:   . Lack of Transportation (Medical): Not on file  . Lack of Transportation (Non-Medical): Not on file  Physical Activity:   . Days of Exercise per Week: Not on file  . Minutes of Exercise per Session: Not on file  Stress:   . Feeling of Stress : Not on file  Social Connections:   . Frequency of Communication with Friends and Family: Not on file  . Frequency of Social Gatherings with Friends and Family: Not on file  . Attends Religious Services: Not on file  . Active Member of Clubs or Organizations: Not on file  . Attends Archivist Meetings: Not on file  . Marital Status: Not on file  Intimate Partner Violence:   . Fear of Current or Ex-Partner: Not on file  . Emotionally Abused: Not on file  . Physically Abused: Not on file  . Sexually Abused: Not on file      Family History  Problem Relation Age of Onset  . Heart  disease Mother   . COPD Mother   . Emphysema Mother   . Cancer Mother        oral  . Heart disease Father   . Diabetes Father   . COPD Father   . Vision loss Father   . Skin cancer Father        skin  . Hyperlipidemia Father   . Hypertension Father   . Heart disease Sister   .  Graves' disease Sister   . Diabetes Sister   . Hyperlipidemia Sister   . Heart failure Sister 52  . Heart disease Brother   . Hyperlipidemia Brother   . Cancer Paternal Grandmother        unknown  . Prostate cancer Paternal Grandfather        Prostate cancer  . Breast cancer Neg Hx     Vitals:   05/20/20 1106  BP: 104/70  Pulse: 93  SpO2: 97%  Weight: 70.9 kg (156 lb 6.4 oz)    Wt Readings from Last 3 Encounters:  05/20/20 70.9 kg (156 lb 6.4 oz)  11/28/19 69.9 kg (154 lb)  08/30/19 70.7 kg (155 lb 12.8 oz)     PHYSICAL EXAM: General: NAD Neck: No JVD, no thyromegaly or thyroid nodule.  Lungs: Clear to auscultation bilaterally with normal respiratory effort. CV: Nondisplaced PMI.  Heart regular S1/S2, no S3/S4, no murmur.  No peripheral edema.  No carotid bruit.  Normal pedal pulses.  Abdomen: Soft, nontender, no hepatosplenomegaly, no distention.  Skin: Intact without lesions or rashes.  Neurologic: Alert and oriented x 3.  Psych: Normal affect. Extremities: No clubbing or cyanosis.  HEENT: Normal.   ASSESSMENT & PLAN:  1. Chronic systolic CHF: Nonischemic cardiomyopathy by [facility] 06/2018. cMRI showed EF 28%, mildly decreased RV size, and no myocardial LGE. Echo in 7/20 showed EF up to 40-45% and echo in 4/21 showed EF up to 50%.  NYHA class II symptoms, she is not volume overloaded on exam.  No BP room for medication titration.  - Continue Lasix 20 mg daily.    - Continue Toprol XL 25 mg bid.  - Continue Entresto 24/26 mg BID - Continue spironolactone 25 mg daily.  - Continue Farxiga 10 mg daily .  - Repeat echo at followup in 6 months.  - BMET today and again in 3 months.   2.  Mitral regurgitation: Only mild on 4/21 echo.   3. Hyperlipidemia: She was on atorvastatin but stopped due to side effects.  I will try her on Crestor 5 mg daily. Lipids/LFTs in 2 months.   Followup in 6 months with echo.     [name] 05/20/2020

## 2020-05-22 ENCOUNTER — Other Ambulatory Visit: Payer: Self-pay | Admitting: Cardiovascular Disease

## 2020-07-15 ENCOUNTER — Other Ambulatory Visit (HOSPITAL_COMMUNITY): Payer: Self-pay | Admitting: Cardiology

## 2020-07-20 ENCOUNTER — Other Ambulatory Visit: Payer: Self-pay | Admitting: Cardiovascular Disease

## 2020-07-20 ENCOUNTER — Other Ambulatory Visit (HOSPITAL_COMMUNITY): Payer: Self-pay | Admitting: Cardiology

## 2020-07-20 ENCOUNTER — Other Ambulatory Visit: Payer: Self-pay

## 2020-07-20 ENCOUNTER — Ambulatory Visit (HOSPITAL_COMMUNITY)
Admission: RE | Admit: 2020-07-20 | Discharge: 2020-07-20 | Disposition: A | Discharge status: Home / Self Care | Payer: 59 | Source: Ambulatory Visit | Attending: Internal Medicine | Admitting: Internal Medicine

## 2020-07-20 DIAGNOSIS — I5032 Chronic diastolic (congestive) heart failure: Secondary | ICD-10-CM | POA: Insufficient documentation

## 2020-07-20 DIAGNOSIS — E7800 Pure hypercholesterolemia, unspecified: Secondary | ICD-10-CM

## 2020-07-20 LAB — COMPREHENSIVE METABOLIC PANEL
ALT: 25 U/L (ref 0–44)
AST: 27 U/L (ref 15–41)
Albumin: 3.9 g/dL (ref 3.5–5.0)
Alkaline Phosphatase: 64 U/L (ref 38–126)
Anion gap: 12 (ref 5–15)
BUN: 19 mg/dL (ref 6–20)
CO2: 26 mmol/L (ref 22–32)
Calcium: 9.6 mg/dL (ref 8.9–10.3)
Chloride: 101 mmol/L (ref 98–111)
Creatinine, Ser: 0.92 mg/dL (ref 0.44–1.00)
GFR, Estimated: >60 mL/min (ref >60)
Glucose, Bld: 185 mg/dL — ABNORMAL HIGH (ref 70–99)
Potassium: 4 mmol/L (ref 3.5–5.1)
Sodium: 139 mmol/L (ref 135–145)
Total Bilirubin: 1 mg/dL (ref 0.3–1.2)
Total Protein: 7 g/dL (ref 6.5–8.1)

## 2020-07-20 LAB — LIPID PANEL
Cholesterol: 171 mg/dL (ref 0–200)
HDL: 46 mg/dL (ref >40)
LDL Cholesterol: 97 mg/dL (ref 0–99)
Total CHOL/HDL Ratio: 3.7 RATIO
Triglycerides: 140 mg/dL (ref <150)
VLDL: 28 mg/dL (ref 0–40)

## 2020-11-14 ENCOUNTER — Other Ambulatory Visit (HOSPITAL_COMMUNITY): Payer: Self-pay | Admitting: Cardiology

## 2020-11-17 ENCOUNTER — Encounter: Payer: Self-pay | Admitting: Gastroenterology

## 2020-11-23 ENCOUNTER — Ambulatory Visit (HOSPITAL_BASED_OUTPATIENT_CLINIC_OR_DEPARTMENT_OTHER)
Admission: RE | Admit: 2020-11-23 | Discharge: 2020-11-23 | Disposition: A | Discharge status: Home / Self Care | Payer: 59 | Source: Ambulatory Visit

## 2020-11-23 ENCOUNTER — Encounter (HOSPITAL_COMMUNITY): Payer: Self-pay | Admitting: Cardiology

## 2020-11-23 ENCOUNTER — Ambulatory Visit (HOSPITAL_COMMUNITY)
Admission: RE | Admit: 2020-11-23 | Discharge: 2020-11-23 | Disposition: A | Discharge status: Home / Self Care | Payer: 59 | Source: Ambulatory Visit | Attending: Cardiology | Admitting: Cardiology

## 2020-11-23 ENCOUNTER — Other Ambulatory Visit: Payer: Self-pay

## 2020-11-23 VITALS — BP 112/60 | HR 85 | Wt 152.8 lb

## 2020-11-23 DIAGNOSIS — I5043 Acute on chronic combined systolic (congestive) and diastolic (congestive) heart failure: Secondary | ICD-10-CM | POA: Insufficient documentation

## 2020-11-23 DIAGNOSIS — Z8249 Family history of ischemic heart disease and other diseases of the circulatory system: Secondary | ICD-10-CM | POA: Diagnosis not present

## 2020-11-23 DIAGNOSIS — I5032 Chronic diastolic (congestive) heart failure: Secondary | ICD-10-CM

## 2020-11-23 DIAGNOSIS — I5022 Chronic systolic (congestive) heart failure: Secondary | ICD-10-CM

## 2020-11-23 DIAGNOSIS — E669 Obesity, unspecified: Secondary | ICD-10-CM | POA: Diagnosis not present

## 2020-11-23 DIAGNOSIS — Z8616 Personal history of COVID-19: Secondary | ICD-10-CM | POA: Diagnosis not present

## 2020-11-23 DIAGNOSIS — Z79899 Other long term (current) drug therapy: Secondary | ICD-10-CM | POA: Diagnosis not present

## 2020-11-23 DIAGNOSIS — I428 Other cardiomyopathies: Secondary | ICD-10-CM | POA: Diagnosis not present

## 2020-11-23 DIAGNOSIS — M545 Low back pain, unspecified: Secondary | ICD-10-CM | POA: Insufficient documentation

## 2020-11-23 DIAGNOSIS — E119 Type 2 diabetes mellitus without complications: Secondary | ICD-10-CM | POA: Diagnosis not present

## 2020-11-23 DIAGNOSIS — Z7984 Long term (current) use of oral hypoglycemic drugs: Secondary | ICD-10-CM | POA: Diagnosis not present

## 2020-11-23 DIAGNOSIS — I340 Nonrheumatic mitral (valve) insufficiency: Secondary | ICD-10-CM | POA: Insufficient documentation

## 2020-11-23 DIAGNOSIS — K219 Gastro-esophageal reflux disease without esophagitis: Secondary | ICD-10-CM | POA: Diagnosis not present

## 2020-11-23 DIAGNOSIS — E785 Hyperlipidemia, unspecified: Secondary | ICD-10-CM | POA: Diagnosis not present

## 2020-11-23 DIAGNOSIS — Z7901 Long term (current) use of anticoagulants: Secondary | ICD-10-CM | POA: Diagnosis not present

## 2020-11-23 DIAGNOSIS — Z7982 Long term (current) use of aspirin: Secondary | ICD-10-CM | POA: Insufficient documentation

## 2020-11-23 LAB — BASIC METABOLIC PANEL
Anion gap: 10 (ref 5–15)
BUN: 22 mg/dL (ref 8–23)
CO2: 27 mmol/L (ref 22–32)
Calcium: 9.6 mg/dL (ref 8.9–10.3)
Chloride: 99 mmol/L (ref 98–111)
Creatinine, Ser: 0.91 mg/dL (ref 0.44–1.00)
GFR, Estimated: >60 mL/min (ref >60)
Glucose, Bld: 162 mg/dL — ABNORMAL HIGH (ref 70–99)
Potassium: 3.6 mmol/L (ref 3.5–5.1)
Sodium: 136 mmol/L (ref 135–145)

## 2020-11-23 LAB — ECHOCARDIOGRAM COMPLETE
Area-P 1/2: 1.68 cm2
Calc EF: 39.3 %
S' Lateral: 3.4 cm
Single Plane A2C EF: 42 %
Single Plane A4C EF: 39.3 %

## 2020-11-23 MED ORDER — METOPROLOL SUCCINATE ER 25 MG PO TB24: ORAL_TABLET | ORAL | 3 refills | Status: DC

## 2020-11-23 MED ORDER — FUROSEMIDE 20 MG PO TABS
20.0000 mg | ORAL_TABLET | ORAL | 5 refills | Status: DC
Start: 2020-11-23 — End: 2021-05-19

## 2020-11-23 NOTE — Progress Notes (Signed)
Advanced Heart Failure Clinic Note   Referring Physician: PCP: [name], MD HF Cardiology: Dr. Aundra Dubin  HPI:  Emily [name] is a 60 y.o. female with Chronic systolic CHF, NICM, mitral regurgitation, DM2, GERD, HDL, and Obesity.    Admitted 11/16 - 06/21/18 with acute systolic CHF. Noted by be NICM by cMRI and cath. At least moderate, functional MR noted. Diuresed with IV lasix and meds adjusted as tolerated.  Echo in 3/20 showed EF 30-35%, diffuse hypokinesis, mild MR.  Echo in 7/20 showed EF up to 40-45%.  Echo in 4/21 showed EF up to 50% with normal RV.  Echo was done today and reviewed, EF 45-50%, diffuse hypokinesis, normal RV.   She returns for followup of CHF. Main complaint is low back pain.  No dyspnea with ADLs.  Walks for exercise at lunchtime.  Dyspnea with heavy exertion.  Feels like she fatigues more easily than in the past.  Still taking Lasix, feels like she gets dehydrated at times.    ECG (personally reviewed): NSR, PAC, low voltage   Labs (1/20): K 3.9, creatinine 0.97  Labs (3/20): K 3.9, creatinine 0.95 Labs (7/20): TSH normal, hgb 14.3 Labs (8/20): LDL 100, K 4.8, creatinine 0.95 Labs (1/21): K 3.6, creatinine 0.92 Labs (7/21): K 3.9, creatinine 0.89 Labs (12/21): K 4, creatinine 0.92, LDL 72  Review of systems complete and found to be negative unless listed in HPI.    Past Medical History 1. Chronic systolic CHF: No significant coronary disease on LHC in 11/19. RHC (11/19) with mean RA 6, PA 42/20, mean PCWP 25, CI 1.8, PVR 1.7 WU. cMRI (11/19) showed EF 28%, mildly decreased RV size, and no myocardial LGE.  - TEE 06/20/18: LVEF 25-30%, Normal RV size and mildly decreased function, Mod LAE, Small PFO, Mild TR, Trivial PI, moderate-severe MR but looked more severe visually. MR is central suggestive of functional MR.  - Echo (3/20): EF 30-35%, diffuse hypokinesis, mild MR.  - Echo (7/20): EF 40-45% - Echo (4/21): EF 50%, normal RV.  - Echo (4/22):  EF 45-50%, diffuse hypokinesis, normal RV.  2. Mitral regurgitation: Moderate-severe functional MR by TEE in 11/19.  Echo (4/21) with only mild MR.  3. DM2 4. Hyperlipidemia.  5. GERD 6. Zio patch x 7 days (9/20): short runs SVT, NSVT (rare).  7. COVID-19 infection: 2/21.   Current Outpatient Medications  Medication Sig Dispense Refill  . acetaminophen (TYLENOL) 500 MG tablet Take 500 mg by mouth every 6 (six) hours as needed for moderate pain.    Marland Kitchen aspirin 81 MG tablet Take 81 mg by mouth daily.    . Blood Glucose Monitoring Suppl (ONE TOUCH ULTRA 2) w/Device KIT USE TO TEST UP TO 4 TIMES PER DAY. 1 each 1  . cholecalciferol (VITAMIN D) 1000 UNITS tablet Take 1,000 Units by mouth daily.    . Dapagliflozin-metFORMIN HCl ER (XIGDUO XR) 11-998 MG TB24 Take 1 tablet by mouth 2 (two) times daily.    Marland Kitchen ENTRESTO 24-26 MG TAKE 1 TABLET BY MOUTH 2 (TWO) TIMES DAILY. 180 tablet 2  . famotidine (PEPCID) 20 MG tablet TAKE 1 TABLET BY MOUTH EVERY DAY 30 tablet 0  . glucose blood (ONE TOUCH ULTRA TEST) test strip USE TO TEST UP TO 4 TIMES A DAY. 100 each 11  . Guaifenesin (MUCINEX MAXIMUM STRENGTH) 1200 MG TB12 Take 1 tablet (1,200 mg total) by mouth every 12 (twelve) hours as needed. 14 tablet 1  . Multiple Vitamins-Minerals (MULTIVITAMIN  WITH MINERALS) tablet Take 1 tablet by mouth daily.    . nitroGLYCERIN (NITROSTAT) 0.4 MG SL tablet Place 1 tablet (0.4 mg total) under the tongue every 5 (five) minutes as needed for chest pain. 25 tablet 1  . Pitavastatin Calcium (LIVALO) 1 MG TABS Take 1 tablet by mouth.    . spironolactone (ALDACTONE) 25 MG tablet Take 1 tablet (25 mg total) by mouth daily. 90 tablet 3  . thiamine (VITAMIN B-1) 100 MG tablet Take 100 mg by mouth daily.    . furosemide (LASIX) 20 MG tablet Take 1 tablet (20 mg total) by mouth every other day. 30 tablet 5  . metoprolol succinate (TOPROL-XL) 25 MG 24 hr tablet Take 2 tablets (50 mg total) by mouth every morning AND 1 tablet (25 mg  total) every evening. 270 tablet 3   No current facility-administered medications for this encounter.    Allergies  Allergen Reactions  . Hornet Venom Swelling  . Metoprolol     depression  . Tetanus Toxoids Other (See Comments)    Big red rash and swelling in the muscle      Social History   Socioeconomic History  . Marital status: Married    Spouse name: Not on file  . Number of children: 5  . Years of education: Not on file  . Highest education level: Master's degree (e.g., MA, MS, MEng, MEd, MSW, MBA)  Occupational History  . 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 Smoking status: Never Smoker  . Smokeless tobacco: Never Used  Vaping Use  . Vaping Use: Never used  Substance and Sexual Activity  . Alcohol use: No  . Drug use: No  . Sexual activity: Yes  Other Topics Concern  . Not on file  Social History Narrative   Married.  Education: [employer].         Social Determinants of Health   Financial Resource Strain: Not on file  Food Insecurity: Not on file  Transportation Needs: Not on file  Physical Activity: Not on file  Stress: Not on file  Social Connections: Not on file  Intimate Partner Violence: Not on file      Family History  Problem Relation Age of Onset  . Heart disease Mother   . COPD Mother   . Emphysema Mother   . Cancer Mother        oral  . Heart disease Father   . Diabetes Father   . COPD Father   . Vision loss Father   . Skin cancer Father        skin  . Hyperlipidemia Father   . Hypertension Father   . Heart disease Sister   . Graves' disease Sister   . Diabetes Sister   . Hyperlipidemia Sister   . Heart failure Sister 21  . Heart disease Brother   . Hyperlipidemia Brother   . Cancer Paternal Grandmother        unknown  . Prostate cancer Paternal Grandfather        Prostate cancer  . Breast cancer Neg Hx     Vitals:   11/23/20 1104  BP: 112/60  Pulse: 85  SpO2: 98%  Weight: 69.3 kg (152 lb 12.8 oz)    Wt Readings  from Last 3 Encounters:  11/23/20 69.3 kg (152 lb 12.8 oz)  05/20/20 70.9 kg (156 lb 6.4 oz)  11/28/19 69.9 kg (154 lb)     PHYSICAL EXAM: General: NAD Neck: No JVD, no thyromegaly or thyroid  nodule.  Lungs: Clear to auscultation bilaterally with normal respiratory effort. CV: Nondisplaced PMI.  Heart regular S1/S2, no S3/S4, no murmur.  No peripheral edema.  No carotid bruit.  Normal pedal pulses.  Abdomen: Soft, nontender, no hepatosplenomegaly, no distention.  Skin: Intact without lesions or rashes.  Neurologic: Alert and oriented x 3.  Psych: Normal affect. Extremities: No clubbing or cyanosis.  HEENT: Normal.   ASSESSMENT & PLAN:  1. Chronic systolic CHF: Nonischemic cardiomyopathy by [facility] 06/2018. cMRI showed EF 28%, mildly decreased RV size, and no myocardial LGE. Echo in 7/20 showed EF up to 40-45% and echo in 4/21 showed EF up to 50%.  Echo today showed stable EF 45-50%.  NYHA class II symptoms, she is not volume overloaded on exam.   - She will decrease Lasix to 20 mg every other day, then stop regular Lasix after a couple of weeks and use prn.     - Increase Toprol XL to 50 qam/25 qpm.  - Continue Entresto 24/26 mg BID - Continue spironolactone 25 mg daily.  - Continue Farxiga 10 mg daily .  - BMET today and again in 3 months.   2. Hyperlipidemia: Good LDL in 12/21 on Livalo.    Followup in 3 months for BMET then see me again in 6 months.   [name] 11/23/2020

## 2020-11-23 NOTE — Patient Instructions (Addendum)
EKG done today.   Labs done today. We will contact you only if your labs are abnormal.  INCREASE Metoprolol to 50mg  (2 tablets) by mouth every morning and 1 tablet by mouth (1 tablet) by mouth every evening.  DECREASE Lasix to 20mg  (1 tablet) by mouth every other day. If you remain stable with this change for 2 weeks you can take lasix as needed.  No other medication changes were made. Please continue all current medications as prescribed.  Your physician recommends that you schedule a follow-up appointment in: 3 months for a lab only appointment and in 6 months with Dr. Aundra Dubin.please contact our office in September to schedule an October appointment.   If you have any questions or concerns before your next appointment please send [place] a message through [place] or call our office at [phone].    TO LEAVE A MESSAGE FOR THE NURSE SELECT OPTION 2, PLEASE LEAVE A MESSAGE INCLUDING: . YOUR NAME . DATE OF BIRTH . CALL BACK NUMBER . REASON FOR CALL**this is important as we prioritize the call backs  YOU WILL RECEIVE A CALL BACK THE SAME DAY AS LONG AS YOU CALL BEFORE 4:00 PM   Do the following things EVERYDAY: 1) Weigh yourself in the morning before breakfast. Write it down and keep it in a log. 2) Take your medicines as prescribed 3) Eat low salt foods--Limit salt (sodium) to 2000 mg per day.  4) Stay as active as you can everyday 5) Limit all fluids for the day to less than 2 liters   At the [facility], you and your health needs are our priority. As part of our continuing mission to provide you with exceptional heart care, we have created designated Provider Care Teams. These Care Teams include your primary Cardiologist (physician) and Advanced Practice Providers (APPs- Physician Assistants and Nurse Practitioners) who all work together to provide you with the care you need, when you need it.   You may see any of the following providers on your designated Care Team at  your next follow up: Marland Kitchen Dr Glori Bickers . Dr [name] . Darrick Grinder, NP . Lyda Jester, PA . Audry Riles, PharmD   Please be sure to bring in all your medications bottles to every appointment.

## 2020-11-23 NOTE — Progress Notes (Signed)
  Echocardiogram 2D Echocardiogram has been performed.  Fidel Levy 11/23/2020, 10:50 AM

## 2020-12-05 ENCOUNTER — Other Ambulatory Visit (HOSPITAL_COMMUNITY): Payer: Self-pay | Admitting: Cardiology

## 2021-02-22 ENCOUNTER — Ambulatory Visit (HOSPITAL_COMMUNITY)
Admission: RE | Admit: 2021-02-22 | Discharge: 2021-02-22 | Disposition: A | Discharge status: Home / Self Care | Payer: 59 | Source: Ambulatory Visit | Attending: Family Medicine | Admitting: Family Medicine

## 2021-02-22 ENCOUNTER — Other Ambulatory Visit: Payer: Self-pay

## 2021-02-22 DIAGNOSIS — I5032 Chronic diastolic (congestive) heart failure: Secondary | ICD-10-CM

## 2021-02-22 LAB — BASIC METABOLIC PANEL
Anion gap: 7 (ref 5–15)
BUN: 22 mg/dL (ref 8–23)
CO2: 25 mmol/L (ref 22–32)
Calcium: 9.2 mg/dL (ref 8.9–10.3)
Chloride: 104 mmol/L (ref 98–111)
Creatinine, Ser: 0.85 mg/dL (ref 0.44–1.00)
GFR, Estimated: >60 mL/min (ref >60)
Glucose, Bld: 173 mg/dL — ABNORMAL HIGH (ref 70–99)
Potassium: 4.2 mmol/L (ref 3.5–5.1)
Sodium: 136 mmol/L (ref 135–145)

## 2021-03-21 ENCOUNTER — Other Ambulatory Visit (HOSPITAL_COMMUNITY): Payer: Self-pay | Admitting: Cardiology

## 2021-04-21 LAB — HM DIABETES EYE EXAM

## 2021-05-19 ENCOUNTER — Other Ambulatory Visit: Payer: Self-pay

## 2021-05-19 ENCOUNTER — Encounter (HOSPITAL_COMMUNITY): Payer: Self-pay | Admitting: Cardiology

## 2021-05-19 ENCOUNTER — Ambulatory Visit (HOSPITAL_COMMUNITY)
Admission: RE | Admit: 2021-05-19 | Discharge: 2021-05-19 | Disposition: A | Discharge status: Home / Self Care | Payer: 59 | Source: Ambulatory Visit | Attending: Cardiology | Admitting: Cardiology

## 2021-05-19 VITALS — BP 118/60 | HR 90 | Wt 160.6 lb

## 2021-05-19 DIAGNOSIS — I428 Other cardiomyopathies: Secondary | ICD-10-CM | POA: Diagnosis not present

## 2021-05-19 DIAGNOSIS — R053 Chronic cough: Secondary | ICD-10-CM | POA: Insufficient documentation

## 2021-05-19 DIAGNOSIS — K219 Gastro-esophageal reflux disease without esophagitis: Secondary | ICD-10-CM | POA: Insufficient documentation

## 2021-05-19 DIAGNOSIS — E785 Hyperlipidemia, unspecified: Secondary | ICD-10-CM | POA: Diagnosis not present

## 2021-05-19 DIAGNOSIS — Z7982 Long term (current) use of aspirin: Secondary | ICD-10-CM | POA: Diagnosis not present

## 2021-05-19 DIAGNOSIS — I5022 Chronic systolic (congestive) heart failure: Secondary | ICD-10-CM | POA: Insufficient documentation

## 2021-05-19 DIAGNOSIS — Z887 Allergy status to serum and vaccine status: Secondary | ICD-10-CM | POA: Insufficient documentation

## 2021-05-19 DIAGNOSIS — I5032 Chronic diastolic (congestive) heart failure: Secondary | ICD-10-CM | POA: Diagnosis not present

## 2021-05-19 DIAGNOSIS — Z8616 Personal history of COVID-19: Secondary | ICD-10-CM | POA: Insufficient documentation

## 2021-05-19 DIAGNOSIS — Z7984 Long term (current) use of oral hypoglycemic drugs: Secondary | ICD-10-CM | POA: Diagnosis not present

## 2021-05-19 DIAGNOSIS — Z8249 Family history of ischemic heart disease and other diseases of the circulatory system: Secondary | ICD-10-CM | POA: Diagnosis not present

## 2021-05-19 LAB — BASIC METABOLIC PANEL
Anion gap: 9 (ref 5–15)
BUN: 17 mg/dL (ref 8–23)
CO2: 23 mmol/L (ref 22–32)
Calcium: 9.5 mg/dL (ref 8.9–10.3)
Chloride: 104 mmol/L (ref 98–111)
Creatinine, Ser: 0.93 mg/dL (ref 0.44–1.00)
GFR, Estimated: >60 mL/min (ref >60)
Glucose, Bld: 251 mg/dL — ABNORMAL HIGH (ref 70–99)
Potassium: 4 mmol/L (ref 3.5–5.1)
Sodium: 136 mmol/L (ref 135–145)

## 2021-05-19 LAB — BRAIN NATRIURETIC PEPTIDE: B Natriuretic Peptide: 21.6 pg/mL (ref 0.0–100.0)

## 2021-05-19 MED ORDER — PANTOPRAZOLE SODIUM 40 MG PO TBEC: 40.0000 mg | DELAYED_RELEASE_TABLET | Freq: Every day | ORAL | 6 refills | Status: DC

## 2021-05-19 NOTE — Patient Instructions (Signed)
STOP Pepcid  START Protonix 40mg  (1 tab) daily  Labs today We will only contact you if something comes back abnormal or we need to make some changes. Otherwise no news is good news!  Your physician recommends that you schedule a follow-up appointment in: 6 months. Our office will call you to arrange this next appointment.   Please call office at [phone] option 2 if you have any questions or concerns.   At the [facility], you and your health needs are our priority. As part of our continuing mission to provide you with exceptional heart care, we have created designated Provider Care Teams. These Care Teams include your primary Cardiologist (physician) and Advanced Practice Providers (APPs- Physician Assistants and Nurse Practitioners) who all work together to provide you with the care you need, when you need it.   You may see any of the following providers on your designated Care Team at your next follow up: Dr Glori Bickers Dr [name] Dr Patrice Paradise, NP Lyda Jester, [place] Ginnie Smart Audry Riles, PharmD   Please be sure to bring in all your medications bottles to every appointment.

## 2021-05-19 NOTE — Progress Notes (Signed)
Advanced Heart Failure Clinic Note   Referring Physician: PCP: [name], MD HF Cardiology: Dr. Aundra Dubin  HPI:  Emily [name] is a 60 y.o. female with Chronic systolic CHF, NICM, mitral regurgitation, DM2, GERD, HDL, and Obesity.    Admitted 11/16 - 06/21/18 with acute systolic CHF. Noted by be NICM by cMRI and cath. At least moderate, functional MR noted. Diuresed with IV lasix and meds adjusted as tolerated.  Echo in 3/20 showed EF 30-35%, diffuse hypokinesis, mild MR.  Echo in 7/20 showed EF up to 40-45%.  Echo in 4/21 showed EF up to 50% with normal RV.  Echo in 4/22 showed EF 45-50%, diffuse hypokinesis, normal RV.   She returns for followup of CHF. Weight is up today.  She has a chronic cough but it is tolerable.  SBP generally runs 80s-90s at home when she checks (on most days).  She denies lightheadedness.  She has slept on 2 pillows chronically, denies PND.  No dyspnea walking on flat ground.  Tends to get short of breath when she "gets anxious."  She uses Lasix rarely.   ECG (personally reviewed): NSR, normal  Labs (1/20): K 3.9, creatinine 0.97  Labs (3/20): K 3.9, creatinine 0.95 Labs (7/20): TSH normal, hgb 14.3 Labs (8/20): LDL 100, K 4.8, creatinine 0.95 Labs (1/21): K 3.6, creatinine 0.92 Labs (7/21): K 3.9, creatinine 0.89 Labs (12/21): K 4, creatinine 0.92, LDL 72 Labs (7/22): K 4.2, creatinine 0.85  Review of systems complete and found to be negative unless listed in HPI.    Past Medical History 1. Chronic systolic CHF: No significant coronary disease on LHC in 11/19. RHC (11/19) with mean RA 6, PA 42/20, mean PCWP 25, CI 1.8, PVR 1.7 WU. cMRI (11/19) showed EF 28%, mildly decreased RV size, and no myocardial LGE.  - TEE 06/20/18: LVEF 25-30%, Normal RV size and mildly decreased function, Mod LAE, Small PFO, Mild TR, Trivial PI, moderate-severe MR but looked more severe visually. MR is central suggestive of functional MR.  - Echo (3/20): EF 30-35%,  diffuse hypokinesis, mild MR.  - Echo (7/20): EF 40-45% - Echo (4/21): EF 50%, normal RV.  - Echo (4/22): EF 45-50%, diffuse hypokinesis, normal RV.  2. Mitral regurgitation: Moderate-severe functional MR by TEE in 11/19.  Echo (4/21) with only mild MR.  3. DM2 4. Hyperlipidemia.  5. GERD 6. Zio patch x 7 days (9/20): short runs SVT, NSVT (rare).  7. COVID-19 infection: 2/21.   Current Outpatient Medications  Medication Sig Dispense Refill   acetaminophen (TYLENOL) 500 MG tablet Take 500 mg by mouth every 6 (six) hours as needed for moderate pain.     aspirin 81 MG tablet Take 81 mg by mouth daily.     Blood Glucose Monitoring Suppl (ONE TOUCH ULTRA 2) w/Device KIT USE TO TEST UP TO 4 TIMES PER DAY. 1 each 1   cholecalciferol (VITAMIN D) 1000 UNITS tablet Take 1,000 Units by mouth daily.     Dapagliflozin-metFORMIN HCl ER (XIGDUO XR) 11-998 MG TB24 Take 1 tablet by mouth 2 (two) times daily.     ENTRESTO 24-26 MG TAKE 1 TABLET BY MOUTH TWICE A DAY 180 tablet 2   furosemide (LASIX) 20 MG tablet Take 20 mg by mouth as needed.     glucose blood (ONE TOUCH ULTRA TEST) test strip USE TO TEST UP TO 4 TIMES A DAY. 100 each 11   Guaifenesin (MUCINEX MAXIMUM STRENGTH) 1200 MG TB12 Take 1 tablet (1,200  mg total) by mouth every 12 (twelve) hours as needed. 14 tablet 1   metoprolol succinate (TOPROL-XL) 25 MG 24 hr tablet Take 2 tablets (50 mg total) by mouth every morning AND 1 tablet (25 mg total) every evening. 270 tablet 3   Multiple Vitamins-Minerals (MULTIVITAMIN WITH MINERALS) tablet Take 1 tablet by mouth daily.     nitroGLYCERIN (NITROSTAT) 0.4 MG SL tablet Place 1 tablet (0.4 mg total) under the tongue every 5 (five) minutes as needed for chest pain. 25 tablet 1   pantoprazole (PROTONIX) 40 MG tablet Take 1 tablet (40 mg total) by mouth daily. 60 tablet 6   Pitavastatin Calcium (LIVALO) 1 MG TABS Take 1 tablet by mouth.     spironolactone (ALDACTONE) 25 MG tablet TAKE 1 TABLET BY MOUTH EVERY  DAY 90 tablet 3   thiamine (VITAMIN B-1) 100 MG tablet Take 100 mg by mouth daily.     No current facility-administered medications for this encounter.    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  Education: [employer].         Social Determinants of Health   Financial Resource Strain: Not on file  Food Insecurity: Not on file  Transportation Needs: Not on file  Physical Activity: Not on file  Stress: Not on file  Social Connections: Not on file  Intimate Partner Violence: Not on file      Family History  Problem Relation Age of Onset   Heart disease Mother    COPD Mother    Emphysema Mother    Cancer Mother        oral   Heart disease Father    Diabetes Father    COPD Father    Vision loss Father    Skin cancer Father        skin   Hyperlipidemia Father    Hypertension Father    Heart disease Sister    [name]' disease Sister    Diabetes Sister    Hyperlipidemia Sister    Heart failure Sister 80   Heart disease Brother    Hyperlipidemia Brother    Cancer Paternal Grandmother        unknown   Prostate cancer Paternal Grandfather        Prostate cancer   Breast cancer Neg Hx     Vitals:   05/19/21 0912  BP: 118/60  Pulse: 90  SpO2: 97%  Weight: 72.8 kg (160 lb 9.6 oz)    Wt Readings  from Last 3 Encounters:  05/19/21 72.8 kg (160 lb 9.6 oz)  11/23/20 69.3 kg (152 lb 12.8 oz)  05/20/20 70.9 kg (156 lb 6.4 oz)  PHYSICAL EXAM: General: NAD Neck: No JVD, no thyromegaly or thyroid nodule.  Lungs: Clear to auscultation bilaterally with normal respiratory effort. CV: Nondisplaced PMI.  Heart regular S1/S2, no S3/S4, no murmur.  No peripheral edema.  No carotid bruit.  Normal pedal pulses.  Abdomen: Soft, nontender, no hepatosplenomegaly, no distention.  Skin: Intact without lesions or rashes.  Neurologic: Alert and oriented x 3.  Psych: Normal affect. Extremities: No clubbing or cyanosis.  HEENT: Normal.   ASSESSMENT & PLAN:  1. Chronic systolic CHF: Nonischemic cardiomyopathy by [facility] 06/2018. cMRI showed EF 28%, mildly decreased RV size, and no myocardial LGE. Echo in 7/20 showed EF up to 40-45% and echo in 4/21 showed EF up to 50%.  Echo in 4/22 with stable EF 45-50%.  NYHA class II symptoms, she is not volume overloaded on exam.  SBP 80s-90s at home but no orthostatic symptoms.  - Continue prn use of Lasix.   - Continue Toprol XL 50 qam/25 qpm. No BP room to increase.  - Continue Entresto 24/26 mg BID, no BP room to increase.  - Continue spironolactone 25 mg daily.  - Continue Farxiga 10 mg daily .  - BMET today and again in 3 months.   2. Hyperlipidemia: Good LDL in 12/21 on Livalo.   3. Chronic cough: ?GERD, would switch from Pepcid to Protonix 40 mg daily to see if it helps.   Followup in 6 months.    [name] 05/19/2021

## 2021-10-13 ENCOUNTER — Encounter: Payer: Self-pay | Admitting: Internal Medicine

## 2021-10-13 ENCOUNTER — Ambulatory Visit: Payer: 59 | Admitting: Internal Medicine

## 2021-10-13 VITALS — BP 110/70 | HR 105 | Temp 98.2°F | Ht 59.5 in | Wt 157.2 lb

## 2021-10-13 DIAGNOSIS — Z124 Encounter for screening for malignant neoplasm of cervix: Secondary | ICD-10-CM

## 2021-10-13 DIAGNOSIS — Z1211 Encounter for screening for malignant neoplasm of colon: Secondary | ICD-10-CM

## 2021-10-13 DIAGNOSIS — Z1231 Encounter for screening mammogram for malignant neoplasm of breast: Secondary | ICD-10-CM

## 2021-10-13 DIAGNOSIS — E119 Type 2 diabetes mellitus without complications: Secondary | ICD-10-CM | POA: Diagnosis not present

## 2021-10-13 DIAGNOSIS — E1169 Type 2 diabetes mellitus with other specified complication: Secondary | ICD-10-CM | POA: Diagnosis not present

## 2021-10-13 DIAGNOSIS — I5042 Chronic combined systolic (congestive) and diastolic (congestive) heart failure: Secondary | ICD-10-CM

## 2021-10-13 LAB — POCT GLYCOSYLATED HEMOGLOBIN (HGB A1C): Hemoglobin A1C: 8.2 % — AB (ref 4.0–5.6)

## 2021-10-13 MED ORDER — OZEMPIC (0.25 OR 0.5 MG/DOSE) 2 MG/1.5ML ~~LOC~~ SOPN: 0.5000 mg | PEN_INJECTOR | SUBCUTANEOUS | 0 refills | Status: AC

## 2021-10-13 NOTE — Progress Notes (Signed)
? ? ?New Patient Office Visit ? ? ? ? ?This visit occurred during the SARS-CoV-2 public health emergency.  Safety protocols were in place, including screening questions prior to the visit, additional usage of staff PPE, and extensive cleaning of exam room while observing appropriate contact time as indicated for disinfecting solutions.  ? ? ?CC/Reason for Visit: Establish care, discuss chronic medical conditions ?Previous PCP: Delman Cheadle ?Last Visit: Unknown ? ?HPI: Emily [name] is a 61 y.o. female who is coming in today for the above mentioned reasons. Past Medical History is significant for: Type 2 diabetes, hyperlipidemia, chronic combined heart failure with nonischemic cardiomyopathy followed routinely by the heart failure clinic.  She is doing well today and has no acute concerns or complaints.  She has 5 children and 7 grandchildren.  She is a retired Estate agent.  She does not smoke, she drinks alcohol occasionally, no known drug allergies, no past surgical history.  Her family history significant for a sister with Graves' disease.  Her mother and sister also had heart conditions that appear to be electrical and not ischemic at a young age.  She is concerned about her elevated blood sugar.  She is on a metformin/dapagliflozin combination. ? ? ?Past Medical/Surgical History: ?Past Medical History:  ?Diagnosis Date  ? Acute combined systolic and diastolic heart failure ([place]) 06/16/2018  ? Allergy   ? CHF (congestive heart failure) ([place])   ? GERD (gastroesophageal reflux disease)   ? NICM (nonischemic cardiomyopathy) ([place]) 06/21/2018  ? NSVT (nonsustained ventricular tachycardia) 06/13/2015  ? PVC's (premature ventricular contractions) 06/11/2015  ? S/P cardiac cath, 06/20/18 06/21/2018  ? Type 2 diabetes mellitus ([place])   ? ? ?Past Surgical History:  ?Procedure Laterality Date  ? CARDIAC CATHETERIZATION N/A 06/19/2015  ? Procedure: Left Heart Cath and Coronary Angiography;  Surgeon:  Leonie Man, MD;  Location: [facility];  Service: Cardiovascular;  Laterality: N/A;  ? RIGHT/LEFT HEART CATH AND CORONARY ANGIOGRAPHY N/A 06/19/2018  ? Procedure: RIGHT/LEFT HEART CATH AND CORONARY ANGIOGRAPHY;  Surgeon: Troy Sine, MD;  Location: [place] [facility];  Service: Cardiovascular;  Laterality: N/A;  ? TEE WITHOUT CARDIOVERSION N/A 06/20/2018  ? Procedure: TRANSESOPHAGEAL ECHOCARDIOGRAM (TEE);  Surgeon: Larey Dresser, MD;  Location: [facility] ENDOSCOPY;  Service: Cardiovascular;  Laterality: N/A;  ? TONSILLECTOMY    ? when she was 3  ? ? ?Social History: ? reports that she has never smoked. She has never used smokeless tobacco. She reports that she does not drink alcohol and does not use drugs. ? ?Allergies: ?Allergies  ?Allergen Reactions  ? Hornet Venom Swelling  ? Metoprolol   ?  depression  ? Tetanus Toxoids Other (See Comments)  ?  Big red rash and swelling in the muscle  ? ? ?Family History:  ?Family History  ?Problem Relation Age of Onset  ? Heart disease Mother   ? COPD Mother   ? Emphysema Mother   ? Cancer Mother   ?     oral  ? Heart disease Father   ? Diabetes Father   ? COPD Father   ? Vision loss Father   ? Skin cancer Father   ?     skin  ? Hyperlipidemia Father   ? Hypertension Father   ? Heart disease Sister   ? Graves' disease Sister   ? Diabetes Sister   ? Hyperlipidemia Sister   ? Heart failure Sister 50  ? Heart disease Brother   ?  Hyperlipidemia Brother   ? Cancer Paternal Grandmother   ?     unknown  ? Prostate cancer Paternal Grandfather   ?     Prostate cancer  ? Breast cancer Neg Hx   ? ? ? ?Current Outpatient Medications:  ?  acetaminophen (TYLENOL) 500 MG tablet, Take 500 mg by mouth every 6 (six) hours as needed for moderate pain., Disp: , Rfl:  ?  aspirin 81 MG tablet, Take 81 mg by mouth daily., Disp: , Rfl:  ?  Blood Glucose Monitoring Suppl (ONE TOUCH ULTRA 2) w/Device KIT, USE TO TEST UP TO 4 TIMES PER DAY., Disp: 1 each, Rfl: 1 ?  cholecalciferol (VITAMIN D)  1000 UNITS tablet, Take 1,000 Units by mouth daily., Disp: , Rfl:  ?  Dapagliflozin-metFORMIN HCl ER (XIGDUO XR) 11-998 MG TB24, Take 1 tablet by mouth 2 (two) times daily., Disp: , Rfl:  ?  ENTRESTO 24-26 MG, TAKE 1 TABLET BY MOUTH TWICE A DAY, Disp: 180 tablet, Rfl: 2 ?  furosemide (LASIX) 20 MG tablet, Take 20 mg by mouth as needed., Disp: , Rfl:  ?  glucose blood (ONE TOUCH ULTRA TEST) test strip, USE TO TEST UP TO 4 TIMES A DAY., Disp: 100 each, Rfl: 11 ?  Guaifenesin (MUCINEX MAXIMUM STRENGTH) 1200 MG TB12, Take 1 tablet (1,200 mg total) by mouth every 12 (twelve) hours as needed., Disp: 14 tablet, Rfl: 1 ?  metoprolol succinate (TOPROL-XL) 25 MG 24 hr tablet, Take 2 tablets (50 mg total) by mouth every morning AND 1 tablet (25 mg total) every evening., Disp: 270 tablet, Rfl: 3 ?  Multiple Vitamins-Minerals (MULTIVITAMIN WITH MINERALS) tablet, Take 1 tablet by mouth daily., Disp: , Rfl:  ?  nitroGLYCERIN (NITROSTAT) 0.4 MG SL tablet, Place 1 tablet (0.4 mg total) under the tongue every 5 (five) minutes as needed for chest pain., Disp: 25 tablet, Rfl: 1 ?  pantoprazole (PROTONIX) 40 MG tablet, Take 1 tablet (40 mg total) by mouth daily., Disp: 60 tablet, Rfl: 6 ?  Pitavastatin Calcium (LIVALO) 1 MG TABS, Take 1 tablet by mouth., Disp: , Rfl:  ?  Semaglutide,0.25 or 0.5MG/DOS, (OZEMPIC, 0.25 OR 0.5 MG/DOSE,) 2 MG/1.5ML SOPN, Inject 0.5 mg into the skin once a week., Disp: 4.5 mL, Rfl: 0 ?  spironolactone (ALDACTONE) 25 MG tablet, TAKE 1 TABLET BY MOUTH EVERY DAY, Disp: 90 tablet, Rfl: 3 ?  thiamine (VITAMIN B-1) 100 MG tablet, Take 100 mg by mouth daily., Disp: , Rfl:  ? ?Review of Systems:  ?Constitutional: Denies fever, chills, diaphoresis, appetite change and fatigue.  ?HEENT: Denies photophobia, eye pain, redness, hearing loss, ear pain, congestion, sore throat, rhinorrhea, sneezing, mouth sores, trouble swallowing, neck pain, neck stiffness and tinnitus.   ?Respiratory: Denies SOB, DOE, cough, chest  tightness,  and wheezing.   ?Cardiovascular: Denies chest pain, palpitations and leg swelling.  ?Gastrointestinal: Denies nausea, vomiting, abdominal pain, diarrhea, constipation, blood in stool and abdominal distention.  ?Genitourinary: Denies dysuria, urgency, frequency, hematuria, flank pain and difficulty urinating.  ?Endocrine: Denies: hot or cold intolerance, sweats, changes in hair or nails, polyuria, polydipsia. ?Musculoskeletal: Denies myalgias, back pain, joint swelling, arthralgias and gait problem.  ?Skin: Denies pallor, rash and wound.  ?Neurological: Denies dizziness, seizures, syncope, weakness, light-headedness, numbness and headaches.  ?Hematological: Denies adenopathy. Easy bruising, personal or family bleeding history  ?Psychiatric/Behavioral: Denies suicidal ideation, mood changes, confusion, nervousness, sleep disturbance and agitation ? ? ? ?Physical Exam: ?Vitals:  ? 10/13/21 1028  ?BP: 110/70  ?Pulse: Marland Kitchen)  105  ?Temp: 98.2 ?F (36.8 ?C)  ?TempSrc: Oral  ?SpO2: 98%  ?Weight: 157 lb 3.2 oz (71.3 kg)  ?Height: 4' 11.5" (1.511 m)  ? ?Body mass index is 31.22 kg/m?. ? ?Constitutional: NAD, calm, comfortable ?Eyes: PERRL, lids and conjunctivae normal, wears corrective lenses ?ENMT: Mucous membranes are moist.  ? ?Respiratory: clear to auscultation bilaterally, no wheezing, no crackles. Normal respiratory effort. No accessory muscle use.  ?Cardiovascular: Regular rate and rhythm, no murmurs / rubs / gallops. No extremity edema. ?Neurologic: Grossly intact and nonfocal ?Psychiatric: Normal judgment and insight. Alert and oriented x 3. Normal mood.  ? ? ?Impression and Plan: ? ?Type 2 diabetes mellitus with other specified complication, without long-term current use of insulin ([place]) ? - Plan: Semaglutide,0.25 or 0.5MG/DOS, (OZEMPIC, 0.25 OR 0.5 MG/DOSE,) 2 MG/1.5ML SOPN ?-Diabetes is uncontrolled with an A1c of 8.2 today. ?-Add semaglutide 0.25 initially with plan to increase dose to 0.5 mg after 5  weeks. ?-Return in 3 months for follow-up. ? ?Encounter for screening mammogram for malignant neoplasm of breast  ?- Plan: MM Digital Screening ? ?Screening for malignant neoplasm of cervix ? - Plan: Ambulator

## 2021-10-13 NOTE — Patient Instructions (Signed)
-  Nice seeing you today!! ? ?-Start ozempic 0.25 mg injected weekly for 4 weeks and then increase to 0.5 mg. ? ?-Schedule follow up in 3 months for your physical. Come in fasting that day. ?

## 2021-10-26 NOTE — Progress Notes (Signed)
? ?  Emily [name] Emily [name] Emily [name] 1961-02-09 [id number] ? ? ?History:  61 y.o. [id number] presents to establish care. Postmenopausal - no HRT, no bleeding. 2017 ASCUS neg HR HPV, otherwise normal pap history. H/O CHF, followed by heart failure clinic. DM managed by PCP.  ? ?Gynecologic History ?Patient's last menstrual period was 10/14/2014. ?  ?Contraception/Family planning: post menopausal status ?Sexually active: Yes ? ?Health Maintenance ?Last Pap: 06/16/2016. Results were: ASCUS neg HR HPV ?Last mammogram: 09/11/2018. Results were: Normal ?Last colonoscopy: 10/26/2015. Results were: Tubular adenomas, 5-year recall ?Last Dexa: Never ? ?Past medical history, past surgical history, family history and social history were all reviewed and documented in the EPIC chart. Married. 5 children, 7 grandchildren. Watches 18 mo most days.  ? ?ROS:  A ROS was performed and pertinent positives and negatives are included. ? ?Exam: ? ?Vitals:  ? 10/27/21 1043  ?BP: 120/70  ?Weight: 156 lb (70.8 kg)  ?Height: 4' 11.5" (1.511 m)  ? ?Body mass index is 30.98 kg/m?. ? ?General appearance:  Normal ?Thyroid:  Symmetrical, normal in size, without palpable masses or nodularity. ?Respiratory ? Auscultation:  Clear without wheezing or rhonchi ?Cardiovascular ? Auscultation:  Regular rate, without rubs, murmurs or gallops ? Edema/varicosities:  Not grossly evident ?Abdominal ? Soft,nontender, without masses, guarding or rebound. ? Liver/spleen:  No organomegaly noted ? Hernia:  None appreciated ? Skin ? Inspection:  Grossly normal ?Breasts: Examined lying and sitting.  ? Right: Without masses, retractions, nipple discharge or axillary adenopathy. ? ? Left: Without masses, retractions, nipple discharge or axillary adenopathy. ?Genitourinary  ? Inguinal/mons:  Normal without inguinal adenopathy ? External genitalia:  Normal appearing vulva with no masses, tenderness, or lesions ? BUS/Urethra/Skene's glands:  Normal ? Vagina:  Normal appearing with normal  color and discharge, no lesions ? Cervix:  Normal appearing without discharge or lesions. Polyp present ? Uterus:  Normal in size, shape and contour.  Midline and mobile, nontender ? Adnexa/parametria:   ?  Rt: Normal in size, without masses or tenderness. ?  Lt: Normal in size, without masses or tenderness. ? Anus and perineum: Normal ? Digital rectal exam: Normal sphincter tone without palpated masses or tenderness ? ?Patient informed chaperone available to be present for breast and pelvic exam. Patient has requested no chaperone to be present. Patient has been advised what will be completed during breast and pelvic exam.  ? ?Assessment/Plan:  61 y.o. [id number] to establish care.  ? ?Well female exam with routine gynecological exam - Plan: Cytology - PAP( Emily [name]). Education provided on SBEs, importance of preventative screenings, current guidelines, high calcium diet, regular exercise, and multivitamin daily.  Labs with PCP.  ? ?Postmenopausal - Plan: DG Bone Density. No HRT, no bleeding.  ? ?Cervical polyp - Plan: Surgical pathology ? ?Screening for osteoporosis - Plan: DG Bone Density ? ?Screening for cervical cancer - 2017 ASCUS neg HR HPV, otherwise normal pap history. Pap today.  ? ?Screening for breast cancer - Normal mammogram history.  Overdue. Discussed current guidelines and importance of preventative screenings. Normal breast exam today. ? ?Screening for colon cancer - 2017 colonoscopy - adenomas. She is likely overdo and will call to schedule this.  ? ?Return in 1 year for annual.  ? ? ? ?Emily [name], 11:17 AM 10/27/2021 ? ?

## 2021-10-27 ENCOUNTER — Other Ambulatory Visit (HOSPITAL_COMMUNITY)
Admission: RE | Admit: 2021-10-27 | Discharge: 2021-10-27 | Disposition: A | Discharge status: Home / Self Care | Payer: 59 | Source: Ambulatory Visit | Attending: Nurse Practitioner | Admitting: Nurse Practitioner

## 2021-10-27 ENCOUNTER — Encounter: Payer: Self-pay | Admitting: Nurse Practitioner

## 2021-10-27 ENCOUNTER — Other Ambulatory Visit: Payer: Self-pay

## 2021-10-27 ENCOUNTER — Ambulatory Visit (INDEPENDENT_AMBULATORY_CARE_PROVIDER_SITE_OTHER): Payer: 59 | Admitting: Nurse Practitioner

## 2021-10-27 VITALS — BP 120/70 | Ht 59.5 in | Wt 156.0 lb

## 2021-10-27 DIAGNOSIS — Z01419 Encounter for gynecological examination (general) (routine) without abnormal findings: Secondary | ICD-10-CM | POA: Diagnosis not present

## 2021-10-27 DIAGNOSIS — Z780 Asymptomatic menopausal state: Secondary | ICD-10-CM

## 2021-10-27 DIAGNOSIS — N841 Polyp of cervix uteri: Secondary | ICD-10-CM | POA: Diagnosis present

## 2021-10-27 DIAGNOSIS — Z13820 Encounter for screening for osteoporosis: Secondary | ICD-10-CM | POA: Diagnosis not present

## 2021-10-28 LAB — SURGICAL PATHOLOGY

## 2021-10-31 LAB — CYTOLOGY - PAP
Comment: NEGATIVE
Diagnosis: NEGATIVE
High risk HPV: NEGATIVE

## 2021-11-17 ENCOUNTER — Telehealth (HOSPITAL_COMMUNITY): Payer: Self-pay | Admitting: Pharmacy Technician

## 2021-11-17 ENCOUNTER — Encounter (HOSPITAL_COMMUNITY): Payer: Self-pay | Admitting: Cardiology

## 2021-11-17 ENCOUNTER — Other Ambulatory Visit (HOSPITAL_COMMUNITY): Payer: Self-pay | Admitting: Cardiology

## 2021-11-17 ENCOUNTER — Ambulatory Visit (HOSPITAL_COMMUNITY)
Admission: RE | Admit: 2021-11-17 | Discharge: 2021-11-17 | Disposition: A | Discharge status: Home / Self Care | Payer: 59 | Source: Ambulatory Visit | Attending: Cardiology | Admitting: Cardiology

## 2021-11-17 ENCOUNTER — Other Ambulatory Visit (HOSPITAL_COMMUNITY): Payer: Self-pay

## 2021-11-17 VITALS — BP 120/70 | HR 111 | Wt 152.4 lb

## 2021-11-17 DIAGNOSIS — I5022 Chronic systolic (congestive) heart failure: Secondary | ICD-10-CM | POA: Diagnosis not present

## 2021-11-17 DIAGNOSIS — I428 Other cardiomyopathies: Secondary | ICD-10-CM | POA: Diagnosis present

## 2021-11-17 DIAGNOSIS — K219 Gastro-esophageal reflux disease without esophagitis: Secondary | ICD-10-CM | POA: Insufficient documentation

## 2021-11-17 DIAGNOSIS — Z79899 Other long term (current) drug therapy: Secondary | ICD-10-CM | POA: Diagnosis not present

## 2021-11-17 DIAGNOSIS — E785 Hyperlipidemia, unspecified: Secondary | ICD-10-CM | POA: Diagnosis not present

## 2021-11-17 DIAGNOSIS — E669 Obesity, unspecified: Secondary | ICD-10-CM | POA: Diagnosis not present

## 2021-11-17 DIAGNOSIS — I5032 Chronic diastolic (congestive) heart failure: Secondary | ICD-10-CM | POA: Diagnosis not present

## 2021-11-17 DIAGNOSIS — E119 Type 2 diabetes mellitus without complications: Secondary | ICD-10-CM | POA: Diagnosis not present

## 2021-11-17 DIAGNOSIS — R002 Palpitations: Secondary | ICD-10-CM | POA: Insufficient documentation

## 2021-11-17 DIAGNOSIS — I340 Nonrheumatic mitral (valve) insufficiency: Secondary | ICD-10-CM | POA: Diagnosis not present

## 2021-11-17 DIAGNOSIS — Z7984 Long term (current) use of oral hypoglycemic drugs: Secondary | ICD-10-CM | POA: Insufficient documentation

## 2021-11-17 DIAGNOSIS — Z8616 Personal history of COVID-19: Secondary | ICD-10-CM | POA: Insufficient documentation

## 2021-11-17 DIAGNOSIS — R000 Tachycardia, unspecified: Secondary | ICD-10-CM | POA: Insufficient documentation

## 2021-11-17 DIAGNOSIS — E7800 Pure hypercholesterolemia, unspecified: Secondary | ICD-10-CM | POA: Diagnosis not present

## 2021-11-17 DIAGNOSIS — I4891 Unspecified atrial fibrillation: Secondary | ICD-10-CM

## 2021-11-17 LAB — LIPID PANEL
Cholesterol: 133 mg/dL (ref 0–200)
HDL: 29 mg/dL — ABNORMAL LOW (ref >40)
LDL Cholesterol: 70 mg/dL (ref 0–99)
Total CHOL/HDL Ratio: 4.6 RATIO
Triglycerides: 169 mg/dL — ABNORMAL HIGH (ref <150)
VLDL: 34 mg/dL (ref 0–40)

## 2021-11-17 LAB — BASIC METABOLIC PANEL WITH GFR
Anion gap: 12 (ref 5–15)
BUN: 17 mg/dL (ref 8–23)
CO2: 26 mmol/L (ref 22–32)
Calcium: 9.7 mg/dL (ref 8.9–10.3)
Chloride: 102 mmol/L (ref 98–111)
Creatinine, Ser: 0.94 mg/dL (ref 0.44–1.00)
GFR, Estimated: >60 mL/min
Glucose, Bld: 177 mg/dL — ABNORMAL HIGH (ref 70–99)
Potassium: 3.8 mmol/L (ref 3.5–5.1)
Sodium: 140 mmol/L (ref 135–145)

## 2021-11-17 LAB — BRAIN NATRIURETIC PEPTIDE: B Natriuretic Peptide: 19.4 pg/mL (ref 0.0–100.0)

## 2021-11-17 NOTE — Progress Notes (Signed)
Medication Samples have been provided to the patient. ? ?Drug name: Corlanor       Strength: '5mg'$         Qty: 2 bottles  LOT: [id number]  Exp.Date: 04-30/2027 ? ?Dosing instructions: take 1 tablet Twice daily ? ? ?The patient has been instructed regarding the correct time, dose, and frequency of taking this medication, including desired effects and most common side effects.  ? ?Rolly Magri M Nikolaus Pienta ?9:48 AM ?11/17/2021  ?

## 2021-11-17 NOTE — Progress Notes (Signed)
Medication Samples have been provided to the patient. ? ?Drug name: Corlanor       Strength: 5 mg        Qty: 2  LOT: [id number]  Exp.Date: 11/28/2025 ? ?Dosing instructions: Take 1 tablet Twice daily ? ? ?The patient has been instructed regarding the correct time, dose, and frequency of taking this medication, including desired effects and most common side effects.  ? ?Emily [name] ?10:58 AM ?11/17/2021 ? ?

## 2021-11-17 NOTE — Progress Notes (Signed)
?Advanced Heart Failure Clinic Note  ? ?Referring Physician: ?PCP: Isaac Bliss, Rayford Halsted, MD ?HF Cardiology: Dr. Aundra Dubin ? ?HPI: ? ?Emily [name] is a 61 y.o. female with Chronic systolic CHF, NICM, mitral regurgitation, DM2, GERD, HDL, and Obesity.   ? ?Admitted 11/16 - 06/21/18 with acute systolic CHF. Noted by be NICM by cMRI and cath. At least moderate, functional MR noted. Diuresed with IV lasix and meds adjusted as tolerated. ? ?Echo in 3/20 showed EF 30-35%, diffuse hypokinesis, mild MR.  Echo in 7/20 showed EF up to 40-45%.  Echo in 4/21 showed EF up to 50% with normal RV.  Echo in 4/22 showed EF 45-50%, diffuse hypokinesis, normal RV.  ? ?She returns for followup of CHF.  She has occasional lightheaded spells, SBP chronically runs in the 90s at home (higher here today).  She says that her HR has been running high more often, will go up to 130 bpm on her Fitbit.  Today, HR in 100s (sinus tachycardia) at rest. Having allergy flare with congestion and some wheezing.  However, generally no exertional dyspnea on flat ground. Gets mild dyspnea if she walks fast up stairs.  No chest pain.  No orthopnea/PND.   ? ?ECG (personally reviewed): Sinus tachy 107, nonspecific T wave flattening ? ?Labs (1/20): K 3.9, creatinine 0.97  ?Labs (3/20): K 3.9, creatinine 0.95 ?Labs (7/20): TSH normal, hgb 14.3 ?Labs (8/20): LDL 100, K 4.8, creatinine 0.95 ?Labs (1/21): K 3.6, creatinine 0.92 ?Labs (7/21): K 3.9, creatinine 0.89 ?Labs (12/21): K 4, creatinine 0.92, LDL 72 ?Labs (7/22): K 4.2, creatinine 0.85 ?Labs (10/22): BNP 21.6, K 4, creatinine 0.93 ? ?Review of systems complete and found to be negative unless listed in HPI.   ? ?Past Medical History ?1. Chronic systolic CHF: No significant coronary disease on LHC in 11/19. RHC (11/19) with mean RA 6, PA 42/20, mean PCWP 25, CI 1.8, PVR 1.7 WU. cMRI (11/19) showed EF 28%, mildly decreased RV size, and no myocardial LGE.  ?- TEE 06/20/18: LVEF 25-30%, Normal RV  size and mildly decreased function, Mod LAE, Small PFO, Mild TR, Trivial PI, moderate-severe MR but looked more severe visually. MR is central suggestive of functional MR.  ?- Echo (3/20): EF 30-35%, diffuse hypokinesis, mild MR.  ?- Echo (7/20): EF 40-45% ?- Echo (4/21): EF 50%, normal RV.  ?- Echo (4/22): EF 45-50%, diffuse hypokinesis, normal RV.  ?2. Mitral regurgitation: Moderate-severe functional MR by TEE in 11/19.  Echo (4/21) with only mild MR.  ?3. DM2 ?4. Hyperlipidemia.  ?5. GERD ?6. Zio patch x 7 days (9/20): short runs SVT, NSVT (rare).  ?7. COVID-19 infection: 2/21.  ? ?Current Outpatient Medications  ?Medication Sig Dispense Refill  ? acetaminophen (TYLENOL) 500 MG tablet Take 500 mg by mouth every 6 (six) hours as needed for moderate pain.    ? aspirin 81 MG tablet Take 81 mg by mouth daily.    ? Blood Glucose Monitoring Suppl (ONE TOUCH ULTRA 2) w/Device KIT USE TO TEST UP TO 4 TIMES PER DAY. 1 each 1  ? cholecalciferol (VITAMIN D) 1000 UNITS tablet Take 1,000 Units by mouth daily.    ? Dapagliflozin-metFORMIN HCl ER (XIGDUO XR) 11-998 MG TB24 Take 1 tablet by mouth 2 (two) times daily.    ? ENTRESTO 24-26 MG TAKE 1 TABLET BY MOUTH TWICE A DAY 180 tablet 2  ? furosemide (LASIX) 20 MG tablet Take 20 mg by mouth as needed.    ? glucose blood (  ONE TOUCH ULTRA TEST) test strip USE TO TEST UP TO 4 TIMES A DAY. 100 each 11  ? Guaifenesin (MUCINEX MAXIMUM STRENGTH) 1200 MG TB12 Take 1 tablet (1,200 mg total) by mouth every 12 (twelve) hours as needed. 14 tablet 1  ? metoprolol succinate (TOPROL-XL) 25 MG 24 hr tablet Take 2 tablets (50 mg total) by mouth every morning AND 1 tablet (25 mg total) every evening. 270 tablet 3  ? Multiple Vitamins-Minerals (MULTIVITAMIN WITH MINERALS) tablet Take 1 tablet by mouth daily.    ? nitroGLYCERIN (NITROSTAT) 0.4 MG SL tablet Place 1 tablet (0.4 mg total) under the tongue every 5 (five) minutes as needed for chest pain. 25 tablet 1  ? pantoprazole (PROTONIX) 40 MG  tablet Take 1 tablet (40 mg total) by mouth daily. 60 tablet 6  ? Pitavastatin Calcium (LIVALO) 1 MG TABS Take 1 tablet by mouth.    ? Semaglutide,0.25 or 0.5MG/DOS, (OZEMPIC, 0.25 OR 0.5 MG/DOSE,) 2 MG/1.5ML SOPN Inject 0.5 mg into the skin once a week. 4.5 mL 0  ? spironolactone (ALDACTONE) 25 MG tablet TAKE 1 TABLET BY MOUTH EVERY DAY 90 tablet 3  ? thiamine (VITAMIN B-1) 100 MG tablet Take 100 mg by mouth daily.    ? ?No current facility-administered medications for this encounter.  ? ? ?Allergies  ?Allergen Reactions  ? Hornet Venom Swelling  ? Metoprolol   ?  depression  ? Tetanus Toxoids Other (See Comments)  ?  Big red rash and swelling in the muscle  ? ? ?  ?Social History  ? ?Socioeconomic History  ? Marital status: Married  ?  Spouse name: Not on file  ? Number of children: 5  ? Years of education: Not on file  ? Highest education level: Master's degree (e.g., MA, MS, MEng, MEd, MSW, MBA)  ?Occupational History  ? 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 Smoking status: Never  ? Smokeless tobacco: Never  ?Vaping Use  ? Vaping Use: Never used  ?Substance and Sexual Activity  ? Alcohol use: Yes  ?  Comment: Rare occas  ? Drug use: No  ? Sexual activity: Yes  ?  Birth control/protection: Post-menopausal  ?Other Topics Concern  ? Not on file  ?Social History Narrative  ? Married.  Education: [employer].  ?   ?   ? ?Social Determinants of Health  ? ?Financial Resource Strain: Low Risk   ? Difficulty of Paying Living Expenses: Not hard at all  ?Food Insecurity: No Food Insecurity  ? Worried About Charity fundraiser in the Last Year: Never true  ? Ran Out of Food in the Last Year: Never true  ?Transportation Needs: No Transportation Needs  ? Lack of Transportation (Medical): No  ? Lack of Transportation (Non-Medical): No  ?Physical Activity: Not on file  ?Stress: Not on file  ?Social Connections: Not on file  ?Intimate Partner Violence: Not on file  ? ? ?  ?Family History  ?Problem Relation Age of Onset  ? Heart  disease Mother   ? COPD Mother   ? Emphysema Mother   ? Cancer Mother   ?     oral  ? Heart disease Father   ? Diabetes Father   ? COPD Father   ? Vision loss Father   ? Skin cancer Father   ?     skin  ? Hyperlipidemia Father   ? Hypertension Father   ? Heart disease Sister   ? Graves' disease Sister   ?  Diabetes Sister   ? Hyperlipidemia Sister   ? Heart failure Sister 14  ? Heart disease Brother   ? Hyperlipidemia Brother   ? Cancer Paternal Grandmother   ?     unknown  ? Prostate cancer Paternal Grandfather   ?     Prostate cancer  ? Breast cancer Neg Hx   ? ? ?Vitals:  ? 11/17/21 0915  ?BP: 120/70  ?Pulse: (!) 111  ?SpO2: 97%  ?Weight: 69.1 kg (152 lb 6.4 oz)  ? ? ?Wt Readings from Last 3 Encounters:  ?11/17/21 69.1 kg (152 lb 6.4 oz)  ?10/27/21 70.8 kg (156 lb)  ?10/13/21 71.3 kg (157 lb 3.2 oz)  ?  ? ?PHYSICAL EXAM: ?General: NAD ?Neck: No JVD, no thyromegaly or thyroid nodule.  ?Lungs: Clear to auscultation bilaterally with normal respiratory effort. ?CV: Nondisplaced PMI.  Heart mildly tachy, regular S1/S2, no S3/S4, no murmur.  No peripheral edema.  No carotid bruit.  Normal pedal pulses.  ?Abdomen: Soft, nontender, no hepatosplenomegaly, no distention.  ?Skin: Intact without lesions or rashes.  ?Neurologic: Alert and oriented x 3.  ?Psych: Normal affect. ?Extremities: No clubbing or cyanosis.  ?HEENT: Normal.  ? ?ASSESSMENT & PLAN: ? ?1. Chronic systolic CHF: Nonischemic cardiomyopathy by [facility] 06/2018. cMRI showed EF 28%, mildly decreased RV size, and no myocardial LGE. Echo in 7/20 showed EF up to 40-45% and echo in 4/21 showed EF up to 50%.  Echo in 4/22 with stable EF 45-50%.  NYHA class II symptoms, she is not volume overloaded on exam.  SBP 90s at home with occasional but not frequent lightheadedness.  HR running higher, feels palpitations.  Sinus tachycardia noted today.  She is symptomatic when HR is up.  ?- Continue prn use of Lasix.   ?- Continue Toprol XL 50 qam/25 qpm. No BP room to increase.   ?- Continue Entresto 24/26 mg BID, no BP room to increase.  ?- Continue spironolactone 25 mg daily.  ?- Continue Farxiga 10 mg daily.  ?- With symptomatic palpitations and sinus tachy, will add ivabradine 5 mg

## 2021-11-17 NOTE — Patient Instructions (Signed)
Medication Changes: ? ?Start Corlanor '5mg'$  Twice daily ? ? ?Lab Work: ? ?none ? ?Testing/Procedures: ? ?Your provider has recommended that  you wear a Zio Patch for 7 days.  This monitor will record your heart rhythm for our review.  IF you have any symptoms while wearing the monitor please press the button.  If you have any issues with the patch or you notice a red or orange light on it please call the company at [phone].  Once you remove the patch please mail it back to the company as soon as possible so we can get the results. ? ? ?Referrals: ? ?none ? ?Special Instructions // Education: ? ?none ? ?Follow-Up in: 2 months  ? ?At the [facility], you and your health needs are our priority. We have a designated team specialized in the treatment of Heart Failure. This Care Team includes your primary Heart Failure Specialized Cardiologist (physician), Advanced Practice Providers (APPs- Physician Assistants and Nurse Practitioners), and Pharmacist who all work together to provide you with the care you need, when you need it.  ? ?You may see any of the following providers on your designated Care Team at your next follow up: ? ?Dr Glori Bickers ?Dr [name] ?Darrick Grinder, NP ?Lyda Jester, PA ?Jessica Milford,NP ?Marlyce Huge, PA ?Audry Riles, PharmD ? ? ?Please be sure to bring in all your medications bottles to every appointment.  ? ?Need to Contact us: ? ?If you have any questions or concerns before your next appointment please send [place] a message through [place] or call our office at [phone].   ? ?TO LEAVE A MESSAGE FOR THE NURSE SELECT OPTION 2, PLEASE LEAVE A MESSAGE INCLUDING: ?YOUR NAME ?DATE OF BIRTH ?CALL BACK NUMBER ?REASON FOR CALL**this is important as we prioritize the call backs ? ?YOU WILL RECEIVE A CALL BACK THE SAME DAY AS LONG AS YOU CALL BEFORE 4:00 PM ? ? ?

## 2021-11-17 NOTE — Telephone Encounter (Signed)
Advanced Heart Failure Patient Advocate Encounter ? ?Prior Authorization for Corlanor has been submitted and approved.   ? ?PA# [id number] ?Effective dates: 10/18/2021 through 11/17/2022 ? ?Patients co-pay is $60 (30 days) 90 day supply required for insurance. Cannot see 90 day co-pay since pharmacy is not in network.  ?

## 2021-11-17 NOTE — Progress Notes (Signed)
Student Intern completes SDOH screening with Pt. No interventions needed at this time. ? ?Su Grand, MSW student intern ?Advanced Heart Failure Clinic ?[place] ?[phone] ? ? ?

## 2021-11-18 ENCOUNTER — Ambulatory Visit
Admission: RE | Admit: 2021-11-18 | Discharge: 2021-11-18 | Disposition: A | Discharge status: Home / Self Care | Payer: 59 | Source: Ambulatory Visit | Attending: Internal Medicine | Admitting: Internal Medicine

## 2021-11-18 ENCOUNTER — Other Ambulatory Visit (HOSPITAL_COMMUNITY): Payer: Self-pay

## 2021-11-18 DIAGNOSIS — Z1231 Encounter for screening mammogram for malignant neoplasm of breast: Secondary | ICD-10-CM

## 2021-11-18 MED ORDER — IVABRADINE HCL 5 MG PO TABS: 5.0000 mg | ORAL_TABLET | Freq: Two times a day (BID) | ORAL | 3 refills | Status: DC

## 2021-11-18 NOTE — Telephone Encounter (Signed)
Advanced Heart Failure Patient Advocate Encounter ? ?Sent 90 day RX request to [employer], corporate) to send to the patient's preferred pharmacy.  Called and spoke with patient's spouse. Emailed her the co-pay card information. ? ?Charlann Boxer, CPhT ? ? ?

## 2021-11-27 ENCOUNTER — Other Ambulatory Visit (HOSPITAL_COMMUNITY): Payer: Self-pay | Admitting: Cardiology

## 2021-11-30 ENCOUNTER — Other Ambulatory Visit (HOSPITAL_COMMUNITY): Payer: Self-pay | Admitting: Cardiology

## 2021-12-15 ENCOUNTER — Other Ambulatory Visit (HOSPITAL_COMMUNITY): Payer: Self-pay | Admitting: Cardiology

## 2022-01-05 ENCOUNTER — Ambulatory Visit (INDEPENDENT_AMBULATORY_CARE_PROVIDER_SITE_OTHER): Payer: 59 | Admitting: Internal Medicine

## 2022-01-05 ENCOUNTER — Encounter: Payer: Self-pay | Admitting: Internal Medicine

## 2022-01-05 VITALS — BP 100/58 | HR 76 | Temp 97.9°F | Ht 59.0 in | Wt 146.8 lb

## 2022-01-05 DIAGNOSIS — I428 Other cardiomyopathies: Secondary | ICD-10-CM

## 2022-01-05 DIAGNOSIS — I5041 Acute combined systolic (congestive) and diastolic (congestive) heart failure: Secondary | ICD-10-CM | POA: Diagnosis not present

## 2022-01-05 DIAGNOSIS — E7800 Pure hypercholesterolemia, unspecified: Secondary | ICD-10-CM

## 2022-01-05 DIAGNOSIS — Z0000 Encounter for general adult medical examination without abnormal findings: Secondary | ICD-10-CM

## 2022-01-05 DIAGNOSIS — Z23 Encounter for immunization: Secondary | ICD-10-CM | POA: Diagnosis not present

## 2022-01-05 DIAGNOSIS — E119 Type 2 diabetes mellitus without complications: Secondary | ICD-10-CM

## 2022-01-05 DIAGNOSIS — I340 Nonrheumatic mitral (valve) insufficiency: Secondary | ICD-10-CM

## 2022-01-05 LAB — COMPREHENSIVE METABOLIC PANEL
ALT: 18 U/L (ref 0–35)
AST: 20 U/L (ref 0–37)
Albumin: 4.5 g/dL (ref 3.5–5.2)
Alkaline Phosphatase: 56 U/L (ref 39–117)
BUN: 16 mg/dL (ref 6–23)
CO2: 29 mEq/L (ref 19–32)
Calcium: 10 mg/dL (ref 8.4–10.5)
Chloride: 100 mEq/L (ref 96–112)
Creatinine, Ser: 0.83 mg/dL (ref 0.40–1.20)
GFR: 75.55 mL/min (ref >60.00)
Glucose, Bld: 126 mg/dL — ABNORMAL HIGH (ref 70–99)
Potassium: 3.9 mEq/L (ref 3.5–5.1)
Sodium: 139 mEq/L (ref 135–145)
Total Bilirubin: 0.8 mg/dL (ref 0.2–1.2)
Total Protein: 7.4 g/dL (ref 6.0–8.3)

## 2022-01-05 LAB — HEMOGLOBIN A1C: Hgb A1c MFr Bld: 6.5 % (ref 4.6–6.5)

## 2022-01-05 LAB — CBC WITH DIFFERENTIAL/PLATELET
Basophils Absolute: 0 10*3/uL (ref 0.0–0.1)
Basophils Relative: 0.3 % (ref 0.0–3.0)
Eosinophils Absolute: 0.1 10*3/uL (ref 0.0–0.7)
Eosinophils Relative: 1.3 % (ref 0.0–5.0)
HCT: 42.4 % (ref 36.0–46.0)
Hemoglobin: 14.3 g/dL (ref 12.0–15.0)
Lymphocytes Relative: 38.7 % (ref 12.0–46.0)
Lymphs Abs: 2.7 10*3/uL (ref 0.7–4.0)
MCHC: 33.7 g/dL (ref 30.0–36.0)
MCV: 87.1 fl (ref 78.0–100.0)
Monocytes Absolute: 0.5 10*3/uL (ref 0.1–1.0)
Monocytes Relative: 7.4 % (ref 3.0–12.0)
Neutro Abs: 3.7 10*3/uL (ref 1.4–7.7)
Neutrophils Relative %: 52.3 % (ref 43.0–77.0)
Platelets: 255 10*3/uL (ref 150.0–400.0)
RBC: 4.87 Mil/uL (ref 3.87–5.11)
RDW: 14.6 % (ref 11.5–15.5)
WBC: 7.1 10*3/uL (ref 4.0–10.5)

## 2022-01-05 LAB — TSH: TSH: 1.49 u[IU]/mL (ref 0.35–5.50)

## 2022-01-05 LAB — LIPID PANEL
Cholesterol: 142 mg/dL (ref 0–200)
HDL: 34.4 mg/dL — ABNORMAL LOW (ref >39.00)
LDL Cholesterol: 80 mg/dL (ref 0–99)
NonHDL: 107.9
Total CHOL/HDL Ratio: 4
Triglycerides: 139 mg/dL (ref 0.0–149.0)
VLDL: 27.8 mg/dL (ref 0.0–40.0)

## 2022-01-05 LAB — MICROALBUMIN / CREATININE URINE RATIO
Creatinine,U: 57.7 mg/dL
Microalb Creat Ratio: 1.2 mg/g (ref 0.0–30.0)
Microalb, Ur: <0.7 mg/dL (ref 0.0–1.9)

## 2022-01-05 LAB — VITAMIN B12: Vitamin B-12: 418 pg/mL (ref 211–911)

## 2022-01-05 LAB — VITAMIN D 25 HYDROXY (VIT D DEFICIENCY, FRACTURES): VITD: 52.87 ng/mL (ref 30.00–100.00)

## 2022-01-05 NOTE — Progress Notes (Signed)
Established Patient Office Visit     CC/Reason for Visit: Annual preventive exam  HPI: Emily [name] is a 61 y.o. female who is coming in today for the above mentioned reasons. Past Medical History is significant for: Type 2 diabetes, hyperlipidemia, chronic combined heart failure with nonischemic cardiomyopathy and mitral valve regurgitation.  She follows with cardiology routinely.  She has routine eye care but no dental care.  She exercises by walking.  She had a mammogram and Pap smear with her gynecologist in April.  She is overdue for shingles and COVID vaccine.  She does not get Tdap due to allergies.   Past Medical/Surgical History: Past Medical History:  Diagnosis Date   Acute combined systolic and diastolic heart failure ([place]) 06/16/2018   Allergy    CHF (congestive heart failure) (HCC)    GERD (gastroesophageal reflux disease)    NICM (nonischemic cardiomyopathy) ([place]) 06/21/2018   NSVT (nonsustained ventricular tachycardia) ([place]) 06/13/2015   PVC's (premature ventricular contractions) 06/11/2015   S/P cardiac cath, 06/20/18 06/21/2018   Type 2 diabetes mellitus ([place])     Past Surgical History:  Procedure Laterality Date   CARDIAC CATHETERIZATION N/A 06/19/2015   Procedure: Left Heart Cath and Coronary Angiography;  Surgeon: Leonie Man, MD;  Location: [facility];  Service: Cardiovascular;  Laterality: N/A;   RIGHT/LEFT HEART CATH AND CORONARY ANGIOGRAPHY N/A 06/19/2018   Procedure: RIGHT/LEFT HEART CATH AND CORONARY ANGIOGRAPHY;  Surgeon: Troy Sine, MD;  Location: [facility];  Service: Cardiovascular;  Laterality: N/A;   TEE WITHOUT CARDIOVERSION N/A 06/20/2018   Procedure: TRANSESOPHAGEAL ECHOCARDIOGRAM (TEE);  Surgeon: Larey Dresser, MD;  Location: [facility];  Service: Cardiovascular;  Laterality: N/A;   TONSILLECTOMY     when she was 3    Social History:  reports that she has never smoked. She has never used  smokeless tobacco. She reports current alcohol use. She reports that she does not use drugs.  Allergies: Allergies  Allergen Reactions   Hornet Venom Swelling   Metoprolol     depression   Tetanus Toxoids Other (See Comments)    Big red rash and swelling in the muscle    Family History:  Family History  Problem Relation Age of Onset   Heart disease Mother    COPD Mother    Emphysema Mother    Cancer Mother        oral   Heart disease Father    Diabetes Father    COPD Father    Vision loss Father    Skin cancer Father        skin   Hyperlipidemia Father    Hypertension Father    Heart disease Sister    Graves' disease Sister    Diabetes Sister    Hyperlipidemia Sister    Heart failure Sister 43   Heart disease Brother    Hyperlipidemia Brother    Cancer Paternal Grandmother        unknown   Prostate cancer Paternal Grandfather        Prostate cancer   Breast cancer Neg Hx      Current Outpatient Medications:    acetaminophen (TYLENOL) 500 MG tablet, Take 500 mg by mouth every 6 (six) hours as needed for moderate pain., Disp: , Rfl:    aspirin 81 MG tablet, Take 81 mg by mouth daily., Disp: , Rfl:    Blood Glucose Monitoring Suppl (ONE TOUCH ULTRA 2) w/Device KIT,  USE TO TEST UP TO 4 TIMES PER DAY., Disp: 1 each, Rfl: 1   cholecalciferol (VITAMIN D) 1000 UNITS tablet, Take 1,000 Units by mouth daily., Disp: , Rfl:    Dapagliflozin-metFORMIN HCl ER (XIGDUO XR) 11-998 MG TB24, Take 1 tablet by mouth 2 (two) times daily., Disp: , Rfl:    ENTRESTO 24-26 MG, TAKE 1 TABLET BY MOUTH TWICE A DAY, Disp: 180 tablet, Rfl: 2   furosemide (LASIX) 20 MG tablet, Take 20 mg by mouth as needed., Disp: , Rfl:    glucose blood (ONE TOUCH ULTRA TEST) test strip, USE TO TEST UP TO 4 TIMES A DAY., Disp: 100 each, Rfl: 11   Guaifenesin (MUCINEX MAXIMUM STRENGTH) 1200 MG TB12, Take 1 tablet (1,200 mg total) by mouth every 12 (twelve) hours as needed., Disp: 14 tablet, Rfl: 1   ivabradine  (CORLANOR) 5 MG TABS tablet, Take 1 tablet (5 mg total) by mouth 2 (two) times daily with a meal., Disp: 180 tablet, Rfl: 3   metoprolol succinate (TOPROL-XL) 25 MG 24 hr tablet, TAKE 1 TABLET BY MOUTH TWICE A DAY (Patient taking differently: Take 2 tablets every morning and 1 tablet at bedtime), Disp: 180 tablet, Rfl: 3   Multiple Vitamins-Minerals (MULTIVITAMIN WITH MINERALS) tablet, Take 1 tablet by mouth daily., Disp: , Rfl:    nitroGLYCERIN (NITROSTAT) 0.4 MG SL tablet, Place 1 tablet (0.4 mg total) under the tongue every 5 (five) minutes as needed for chest pain., Disp: 25 tablet, Rfl: 1   pantoprazole (PROTONIX) 40 MG tablet, Take 1 tablet (40 mg total) by mouth daily., Disp: 60 tablet, Rfl: 6   Pitavastatin Calcium (LIVALO) 1 MG TABS, Take 1 tablet by mouth., Disp: , Rfl:    Semaglutide,0.25 or 0.5MG/DOS, (OZEMPIC, 0.25 OR 0.5 MG/DOSE,) 2 MG/1.5ML SOPN, Inject 0.5 mg into the skin once a week., Disp: 4.5 mL, Rfl: 0   spironolactone (ALDACTONE) 25 MG tablet, TAKE 1 TABLET BY MOUTH EVERY DAY, Disp: 90 tablet, Rfl: 3   thiamine (VITAMIN B-1) 100 MG tablet, Take 100 mg by mouth daily., Disp: , Rfl:   Review of Systems:  Constitutional: Denies fever, chills, diaphoresis, appetite change and fatigue.  HEENT: Denies photophobia, eye pain, redness, hearing loss, ear pain, congestion, sore throat, rhinorrhea, sneezing, mouth sores, trouble swallowing, neck pain, neck stiffness and tinnitus.   Respiratory: Denies SOB, DOE, cough, chest tightness,  and wheezing.   Cardiovascular: Denies chest pain, palpitations and leg swelling.  Gastrointestinal: Denies nausea, vomiting, abdominal pain, diarrhea, constipation, blood in stool and abdominal distention.  Genitourinary: Denies dysuria, urgency, frequency, hematuria, flank pain and difficulty urinating.  Endocrine: Denies: hot or cold intolerance, sweats, changes in hair or nails, polyuria, polydipsia. Musculoskeletal: Denies myalgias, back pain, joint  swelling, arthralgias and gait problem.  Skin: Denies pallor, rash and wound.  Neurological: Denies dizziness, seizures, syncope, weakness, light-headedness, numbness and headaches.  Hematological: Denies adenopathy. Easy bruising, personal or family bleeding history  Psychiatric/Behavioral: Denies suicidal ideation, mood changes, confusion, nervousness, sleep disturbance and agitation    Physical Exam: Vitals:   01/05/22 0850  BP: (!) 100/58  Pulse: 76  Temp: 97.9 F (36.6 C)  TempSrc: Oral  SpO2: 98%  Weight: 146 lb 12.8 oz (66.6 kg)  Height: _0  (1.499 m)    Body mass index is 29.65 kg/m.   Constitutional: NAD, calm, comfortable Eyes: PERRL, lids and conjunctivae normal, wears corrective lenses ENMT: Mucous membranes are moist. Posterior pharynx clear of any exudate or lesions. Normal dentition. Tympanic  membrane is pearly white, no erythema or bulging. Neck: normal, supple, no masses, no thyromegaly Respiratory: clear to auscultation bilaterally, no wheezing, no crackles. Normal respiratory effort. No accessory muscle use.  Cardiovascular: Regular rate and rhythm, no murmurs / rubs / gallops. No extremity edema. 2+ pedal pulses. No carotid bruits.  Abdomen: no tenderness, no masses palpated. No hepatosplenomegaly. Bowel sounds positive.  Musculoskeletal: no clubbing / cyanosis. No joint deformity upper and lower extremities. Good ROM, no contractures. Normal muscle tone.  Skin: no rashes, lesions, ulcers. No induration Neurologic: CN 2-12 grossly intact. Sensation intact, DTR normal. Strength 5/5 in all 4.  Psychiatric: Normal judgment and insight. Alert and oriented x 3. Normal mood.    Diabetic Foot Exam - Simple   Simple Foot Form Diabetic Foot exam was performed with the following findings: Yes 01/05/2022  9:16 AM  Visual Inspection No deformities, no ulcerations, no other skin breakdown bilaterally: Yes Sensation Testing Intact to touch and monofilament testing  bilaterally: Yes Pulse Check Posterior Tibialis and Dorsalis pulse intact bilaterally: Yes Comments      Impression and Plan:  Encounter for preventive health examination -Recommend routine eye and dental care. -Immunizations: First shingles in office, she will get COVID-vaccine at pharmacy, allergic to tetanus toxoid. -Healthy lifestyle discussed in detail. -Labs to be updated today. -Colon cancer screening: 09/2015, 7-year follow-up -Breast cancer screening: 10/2021 -Cervical cancer screening: 10/2021 -Lung cancer screening: Not applicable -Prostate cancer screening: Not applicable -DEXA: Not applicable  NICM (nonischemic cardiomyopathy) ([place]) Nonrheumatic mitral valve regurgitation Acute combined systolic and diastolic heart failure ([place])  - Plan: TSH, Vitamin B12, VITAMIN D 25 Hydroxy (Vit-D Deficiency, Fractures) -Compensated, followed routinely by cardiology  Elevated LDL cholesterol level  - Plan: Lipid panel  Non-insulin treated type 2 diabetes mellitus ([place])  - Plan: CBC with Differential/Platelet, Comprehensive metabolic panel, Hemoglobin A1c, Microalbumin / creatinine urine ratio -Last A1c in March was not at goal at 8.2.  We had started her on Ozempic 0.5 mg, recheck A1c today.  Need for shingles vaccine -For shingles vaccine administered today.     Patient Instructions  -Nice seeing you today!!  -Lab work today; will notify you once results are available.  -Shingles vaccine today.  -Remember your COVID vaccine at the pharmacy.  -Schedule follow up in 3 months.      Lelon Frohlich, MD [facility]

## 2022-01-05 NOTE — Addendum Note (Signed)
Addended by: Agnes Lawrence on: 01/05/2022 09:39 AM   Modules accepted: Orders

## 2022-01-05 NOTE — Patient Instructions (Signed)
-  Nice seeing you today!!  -Lab work today; will notify you once results are available.  -Shingles vaccine today.  -Remember your COVID vaccine at the pharmacy.  -Schedule follow up in 3 months.

## 2022-01-14 ENCOUNTER — Telehealth: Payer: Self-pay | Admitting: Internal Medicine

## 2022-01-14 NOTE — Telephone Encounter (Signed)
Pt requested a refill of Ozempic. Pt went to the pharmacy and was told medication was inactive. Pt also states she should be getting .5 and not .'25mg'$ . Please advise. Please send to: [id number]:  [phone] Fax:  [phone]

## 2022-01-17 MED ORDER — OZEMPIC (0.25 OR 0.5 MG/DOSE) 2 MG/3ML ~~LOC~~ SOPN: 0.5000 mg | PEN_INJECTOR | SUBCUTANEOUS | 3 refills | Status: DC

## 2022-01-17 NOTE — Telephone Encounter (Signed)
Refill sent.

## 2022-01-17 NOTE — Telephone Encounter (Signed)
Patient is now out of her prescription and needs the refill called in.

## 2022-01-20 ENCOUNTER — Encounter (HOSPITAL_COMMUNITY): Payer: Self-pay

## 2022-01-20 ENCOUNTER — Ambulatory Visit (HOSPITAL_COMMUNITY)
Admission: RE | Admit: 2022-01-20 | Discharge: 2022-01-20 | Disposition: A | Discharge status: Home / Self Care | Payer: 59 | Source: Ambulatory Visit | Attending: Cardiology | Admitting: Cardiology

## 2022-01-20 VITALS — BP 92/66 | HR 68 | Wt 147.2 lb

## 2022-01-20 DIAGNOSIS — I340 Nonrheumatic mitral (valve) insufficiency: Secondary | ICD-10-CM | POA: Diagnosis not present

## 2022-01-20 DIAGNOSIS — I428 Other cardiomyopathies: Secondary | ICD-10-CM | POA: Diagnosis not present

## 2022-01-20 DIAGNOSIS — I5022 Chronic systolic (congestive) heart failure: Secondary | ICD-10-CM | POA: Diagnosis present

## 2022-01-20 DIAGNOSIS — Z79899 Other long term (current) drug therapy: Secondary | ICD-10-CM | POA: Diagnosis not present

## 2022-01-20 DIAGNOSIS — Z7985 Long-term (current) use of injectable non-insulin antidiabetic drugs: Secondary | ICD-10-CM | POA: Insufficient documentation

## 2022-01-20 DIAGNOSIS — Z7902 Long term (current) use of antithrombotics/antiplatelets: Secondary | ICD-10-CM | POA: Diagnosis not present

## 2022-01-20 DIAGNOSIS — I5032 Chronic diastolic (congestive) heart failure: Secondary | ICD-10-CM

## 2022-01-20 DIAGNOSIS — E669 Obesity, unspecified: Secondary | ICD-10-CM | POA: Diagnosis not present

## 2022-01-20 DIAGNOSIS — Z7901 Long term (current) use of anticoagulants: Secondary | ICD-10-CM | POA: Insufficient documentation

## 2022-01-20 DIAGNOSIS — E785 Hyperlipidemia, unspecified: Secondary | ICD-10-CM | POA: Insufficient documentation

## 2022-01-20 DIAGNOSIS — E119 Type 2 diabetes mellitus without complications: Secondary | ICD-10-CM | POA: Insufficient documentation

## 2022-01-20 DIAGNOSIS — K219 Gastro-esophageal reflux disease without esophagitis: Secondary | ICD-10-CM | POA: Diagnosis not present

## 2022-01-20 DIAGNOSIS — Z7984 Long term (current) use of oral hypoglycemic drugs: Secondary | ICD-10-CM | POA: Insufficient documentation

## 2022-01-20 NOTE — Patient Instructions (Addendum)
Thank you for coming in today  EKG was done today No labs were done today No medication changes   Your physician recommends that you schedule a follow-up appointment in: 4-6 months  Call in August of 2023 to make appointment   At the [facility], you and your health needs are our priority. As part of our continuing mission to provide you with exceptional heart care, we have created designated Provider Care Teams. These Care Teams include your primary Cardiologist (physician) and Advanced Practice Providers (APPs- Physician Assistants and Nurse Practitioners) who all work together to provide you with the care you need, when you need it.   You may see any of the following providers on your designated Care Team at your next follow up: Dr Glori Bickers Dr Haynes Kerns, NP Lyda Jester, [place] [facility] [place], [place] Audry Riles, PharmD   Please be sure to bring in all your medications bottles to every appointment.   If you have any questions or concerns before your next appointment please send [place] a message through [place] or call our office at [phone].    TO LEAVE A MESSAGE FOR THE NURSE SELECT OPTION 2, PLEASE LEAVE A MESSAGE INCLUDING: YOUR NAME DATE OF BIRTH CALL BACK NUMBER REASON FOR CALL**this is important as we prioritize the call backs  YOU WILL RECEIVE A CALL BACK THE SAME DAY AS LONG AS YOU CALL BEFORE 4:00 PM

## 2022-01-20 NOTE — Progress Notes (Signed)
Advanced Heart Failure Clinic Note   Referring Physician: PCP: Isaac Bliss, Rayford Halsted, MD HF Cardiology: Dr. Aundra Dubin  HPI:  Emily [name] is a 61 y.o. female with Chronic systolic CHF, NICM, mitral regurgitation, DM2, GERD, HDL, and Obesity.    Admitted 11/16 - 06/21/18 with acute systolic CHF. Noted by be NICM by cMRI and cath. At least moderate, functional MR noted. Diuresed with IV lasix and meds adjusted as tolerated.  Echo in 3/20 showed EF 30-35%, diffuse hypokinesis, mild MR.  Echo in 7/20 showed EF up to 40-45%.  Echo in 4/21 showed EF up to 50% with normal RV.  Echo in 4/22 showed EF 45-50%, diffuse hypokinesis, normal RV.   Last OV 4/23 , she noted increase in baseline HR and symptomatic w/ palpitations. EKG showed sinus tach. Soft BP limited titration of metoprolol. She was started on ivabradine and Zio was placed to r/o Afib.   Zio showed predominant NSR, no Afib, rare PACs and PVCs and 2 short runs of NSVT.   Presents back today for 2 month f/u. Feels much better. HR improved. No further palpitations. EKG today shows NSR 69 bpm. BP soft, 79K systolic but c/w baseline. Occasional positional dizziness if she stands too quickly, but not often.   Uses lasix PRN but has not had to use lately. Starting to lose wt. PCP just started Ozempic.    ECG (personally reviewed):  NSR 69 bpm   Labs (1/20): K 3.9, creatinine 0.97  Labs (3/20): K 3.9, creatinine 0.95 Labs (7/20): TSH normal, hgb 14.3 Labs (8/20): LDL 100, K 4.8, creatinine 0.95 Labs (1/21): K 3.6, creatinine 0.92 Labs (7/21): K 3.9, creatinine 0.89 Labs (12/21): K 4, creatinine 0.92, LDL 72 Labs (7/22): K 4.2, creatinine 0.85 Labs (10/22): BNP 21.6, K 4, creatinine 0.93 Labs (4/23) K 3.8, SCr 0.94, BNP 19   Review of systems complete and found to be negative unless listed in HPI.    Past Medical History 1. Chronic systolic CHF: No significant coronary disease on LHC in 11/19. RHC (11/19) with mean RA  6, PA 42/20, mean PCWP 25, CI 1.8, PVR 1.7 WU. cMRI (11/19) showed EF 28%, mildly decreased RV size, and no myocardial LGE.  - TEE 06/20/18: LVEF 25-30%, Normal RV size and mildly decreased function, Mod LAE, Small PFO, Mild TR, Trivial PI, moderate-severe MR but looked more severe visually. MR is central suggestive of functional MR.  - Echo (3/20): EF 30-35%, diffuse hypokinesis, mild MR.  - Echo (7/20): EF 40-45% - Echo (4/21): EF 50%, normal RV.  - Echo (4/22): EF 45-50%, diffuse hypokinesis, normal RV.  2. Mitral regurgitation: Moderate-severe functional MR by TEE in 11/19.  Echo (4/21) with only mild MR.  3. DM2 4. Hyperlipidemia.  5. GERD 6. Zio patch x 7 days (9/20): short runs SVT, NSVT (rare).  7. COVID-19 infection: 2/21.   Current Outpatient Medications  Medication Sig Dispense Refill   acetaminophen (TYLENOL) 500 MG tablet Take 500 mg by mouth every 6 (six) hours as needed for moderate pain.     aspirin 81 MG tablet Take 81 mg by mouth daily.     Blood Glucose Monitoring Suppl (ONE TOUCH ULTRA 2) w/Device KIT USE TO TEST UP TO 4 TIMES PER DAY. 1 each 1   cholecalciferol (VITAMIN D) 1000 UNITS tablet Take 1,000 Units by mouth daily.     Dapagliflozin-metFORMIN HCl ER (XIGDUO XR) 11-998 MG TB24 Take 1 tablet by mouth 2 (two) times daily.  ENTRESTO 24-26 MG TAKE 1 TABLET BY MOUTH TWICE A DAY 180 tablet 2   furosemide (LASIX) 20 MG tablet Take 20 mg by mouth as needed.     glucose blood (ONE TOUCH ULTRA TEST) test strip USE TO TEST UP TO 4 TIMES A DAY. 100 each 11   Guaifenesin (MUCINEX MAXIMUM STRENGTH) 1200 MG TB12 Take 1 tablet (1,200 mg total) by mouth every 12 (twelve) hours as needed. 14 tablet 1   ivabradine (CORLANOR) 5 MG TABS tablet Take 1 tablet (5 mg total) by mouth 2 (two) times daily with a meal. 180 tablet 3   Metoprolol Succinate 25 MG CS24 Take by mouth. 50 mg in the AM and 25 mg in the PM     Multiple Vitamins-Minerals (MULTIVITAMIN WITH MINERALS) tablet Take 1  tablet by mouth daily.     pantoprazole (PROTONIX) 40 MG tablet Take 1 tablet (40 mg total) by mouth daily. 60 tablet 6   Pitavastatin Calcium (LIVALO) 1 MG TABS Take 1 tablet by mouth.     Semaglutide,0.25 or 0.5MG /DOS, (OZEMPIC, 0.25 OR 0.5 MG/DOSE,) 2 MG/3ML SOPN Inject 0.5 mg into the skin every 7 (seven) weeks. 3 mL 3   spironolactone (ALDACTONE) 25 MG tablet TAKE 1 TABLET BY MOUTH EVERY DAY 90 tablet 3   thiamine (VITAMIN B-1) 100 MG tablet Take 100 mg by mouth daily.     nitroGLYCERIN (NITROSTAT) 0.4 MG SL tablet Place 1 tablet (0.4 mg total) under the tongue every 5 (five) minutes as needed for chest pain. (Patient not taking: Reported on 01/20/2022) 25 tablet 1   No current facility-administered medications for this encounter.    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  Education: [employer].         Social Determinants of Health   Financial Resource Strain: Low Risk  (11/17/2021)   Overall Financial Resource Strain (CARDIA)    Difficulty of Paying Living Expenses: Not hard at all  Food Insecurity: No Food Insecurity (11/17/2021)   Hunger Vital Sign    Worried About Running Out of Food in the Last Year: Never true    Ran Out of Food in the  Last Year: Never true  Transportation Needs: No Transportation Needs (11/17/2021)   PRAPARE - Hydrologist (Medical): No    Lack of Transportation (Non-Medical): No  Physical Activity: Not on file  Stress: Not on file  Social Connections: Not on file  Intimate Partner Violence: Not on file      Family History  Problem Relation Age of Onset   Heart  disease Mother    COPD Mother    Emphysema Mother    Cancer Mother        oral   Heart disease Father    Diabetes Father    COPD Father    Vision loss Father    Skin cancer Father        skin   Hyperlipidemia Father    Hypertension Father    Heart disease Sister    [name]' disease Sister    Diabetes Sister    Hyperlipidemia Sister    Heart failure Sister 96   Heart disease Brother    Hyperlipidemia Brother    Cancer Paternal Grandmother        unknown   Prostate cancer Paternal Grandfather        Prostate cancer   Breast cancer Neg Hx     Vitals:   01/20/22 1129  BP: 92/66  Pulse: 68  SpO2: 98%  Weight: 66.8 kg (147 lb 3.2 oz)     Wt Readings from Last 3 Encounters:  01/20/22 66.8 kg (147 lb 3.2 oz)  01/05/22 66.6 kg (146 lb 12.8 oz)  11/17/21 69.1 kg (152 lb 6.4 oz)     PHYSICAL EXAM: General:  Well appearing. No respiratory difficulty HEENT: normal Neck: supple. no JVD. Carotids 2+ bilat; no bruits. No lymphadenopathy or thyromegaly appreciated. Cor: PMI nondisplaced. Regular rate & rhythm. No rubs, gallops or murmurs. Lungs: clear Abdomen: soft, nontender, nondistended. No hepatosplenomegaly. No bruits or masses. Good bowel sounds. Extremities: no cyanosis, clubbing, rash, edema Neuro: alert & oriented x 3, cranial nerves grossly intact. moves all 4 extremities w/o difficulty. Affect pleasant.   ASSESSMENT & PLAN:  1. Chronic systolic CHF: Nonischemic cardiomyopathy by [facility] 06/2018. cMRI showed EF 28%, mildly decreased RV size, and no myocardial LGE. Echo in 7/20 showed EF up  to 40-45% and echo in 4/21 showed EF up to 50%.  Echo in 4/22 with stable EF 45-50%. NYHA Class II (stable). Euvolemic on exam. BMP recently done at PCP office. I have personally reviewed labs. SCr and K normal.  - Continue prn use of Lasix.   - Continue Toprol XL 50 qam/25 qpm.  - Continue Entresto 24/26 mg BID. BP too soft for titration  - Continue spironolactone 25 mg daily.  - Continue Farxiga 10 mg daily.  - Continue Ivabradine 5 mg bid  2. Hyperlipidemia: On Livalo. Recent LP 6/23 w/ LDL 80, TG 139   F/u w/ Dr. Aundra Dubin in 4-6 months   Lyda Jester, PA-C  01/20/2022

## 2022-01-25 ENCOUNTER — Encounter: Payer: Self-pay | Admitting: Internal Medicine

## 2022-02-24 ENCOUNTER — Other Ambulatory Visit (HOSPITAL_COMMUNITY): Payer: Self-pay | Admitting: Cardiology

## 2022-04-07 ENCOUNTER — Encounter: Payer: Self-pay | Admitting: Internal Medicine

## 2022-04-07 ENCOUNTER — Ambulatory Visit: Payer: 59 | Admitting: Internal Medicine

## 2022-04-07 VITALS — BP 110/68 | HR 82 | Temp 98.2°F | Wt 143.4 lb

## 2022-04-07 DIAGNOSIS — Z23 Encounter for immunization: Secondary | ICD-10-CM | POA: Diagnosis not present

## 2022-04-07 DIAGNOSIS — I5032 Chronic diastolic (congestive) heart failure: Secondary | ICD-10-CM

## 2022-04-07 DIAGNOSIS — E119 Type 2 diabetes mellitus without complications: Secondary | ICD-10-CM | POA: Diagnosis not present

## 2022-04-07 DIAGNOSIS — E7800 Pure hypercholesterolemia, unspecified: Secondary | ICD-10-CM

## 2022-04-07 LAB — POCT GLYCOSYLATED HEMOGLOBIN (HGB A1C): Hemoglobin A1C: 6 % — AB (ref 4.0–5.6)

## 2022-04-07 NOTE — Progress Notes (Signed)
Established Patient Office Visit     CC/Reason for Visit: 35-monthfollow-up chronic medical conditions  HPI: Emily [name] a 61y.o. female who is coming in today for the above mentioned reasons. Past Medical History is significant for: Type 2 diabetes, hyperlipidemia, chronic combined heart failure with nonischemic cardiomyopathy and mitral valve regurgitation.  She follows with cardiology routinely.  She is feeling well today and has no acute concerns or complaints.   Past Medical/Surgical History: Past Medical History:  Diagnosis Date   Acute combined systolic and diastolic heart failure ([facility] 06/16/2018   Allergy    CHF (congestive heart failure) (HCC)    GERD (gastroesophageal reflux disease)    NICM (nonischemic cardiomyopathy) ([place] 06/21/2018   NSVT (nonsustained ventricular tachycardia) ([facility] 06/13/2015   PVC's (premature ventricular contractions) 06/11/2015   S/P cardiac cath, 06/20/18 06/21/2018   Type 2 diabetes mellitus ([facility]     Past Surgical History:  Procedure Laterality Date   CARDIAC CATHETERIZATION N/A 06/19/2015   Procedure: Left Heart Cath and Coronary Angiography;  Surgeon: DLeonie Man MD;  [facility];  Service: Cardiovascular;  Laterality: N/A;   RIGHT/LEFT HEART CATH AND CORONARY ANGIOGRAPHY N/A 06/19/2018   Procedure: RIGHT/LEFT HEART CATH AND CORONARY ANGIOGRAPHY;  Surgeon: KTroy Sine MD;  [facility];  Service: Cardiovascular;  Laterality: N/A;   TEE WITHOUT CARDIOVERSION N/A 06/20/2018   Procedure: TRANSESOPHAGEAL ECHOCARDIOGRAM (TEE);  Surgeon: MLarey Dresser MD;  [facility];  Service: Cardiovascular;  Laterality: N/A;   TONSILLECTOMY     when she was 3    Social History:  reports that she has never smoked. She has never used smokeless tobacco. She reports current alcohol use. She reports that she does not use drugs.  Allergies: Allergies  Allergen Reactions    Hornet Venom Swelling   Metoprolol     depression   Tetanus Toxoids Other (See Comments)    Big red rash and swelling in the muscle    Family History:  Family History  Problem Relation Age of Onset   Heart disease Mother    COPD Mother    Emphysema Mother    Cancer Mother        oral   Heart disease Father    Diabetes Father    COPD Father    Vision loss Father    Skin cancer Father        skin   Hyperlipidemia Father    Hypertension Father    Heart disease Sister    Graves' disease Sister    Diabetes Sister    Hyperlipidemia Sister    Heart failure Sister 353  Heart disease Brother    Hyperlipidemia Brother    Cancer Paternal Grandmother        unknown   Prostate cancer Paternal Grandfather        Prostate cancer   Breast cancer Neg Hx      Current Outpatient Medications:    acetaminophen (TYLENOL) 500 MG tablet, Take 500 mg by mouth every 6 (six) hours as needed for moderate pain., Disp: , Rfl:    aspirin 81 MG tablet, Take 81 mg by mouth daily., Disp: , Rfl:    Blood Glucose Monitoring Suppl (ONE TOUCH ULTRA 2) w/Device KIT, USE TO TEST UP TO 4 TIMES PER DAY., Disp: 1 each, Rfl: 1   cholecalciferol (VITAMIN D) 1000 UNITS tablet, Take 1,000 Units by mouth daily., Disp: , Rfl:  Dapagliflozin-metFORMIN HCl ER (XIGDUO XR) 11-998 MG TB24, Take 1 tablet by mouth 2 (two) times daily., Disp: , Rfl:    ENTRESTO 24-26 MG, TAKE 1 TABLET BY MOUTH TWICE A DAY, Disp: 180 tablet, Rfl: 2   furosemide (LASIX) 20 MG tablet, Take 20 mg by mouth as needed., Disp: , Rfl:    glucose blood (ONE TOUCH ULTRA TEST) test strip, USE TO TEST UP TO 4 TIMES A DAY., Disp: 100 each, Rfl: 11   Guaifenesin (MUCINEX MAXIMUM STRENGTH) 1200 MG TB12, Take 1 tablet (1,200 mg total) by mouth every 12 (twelve) hours as needed., Disp: 14 tablet, Rfl: 1   ivabradine (CORLANOR) 5 MG TABS tablet, Take 1 tablet (5 mg total) by mouth 2 (two) times daily with a meal., Disp: 180 tablet, Rfl: 3   Metoprolol  Succinate 25 MG CS24, Take by mouth. 50 mg in the AM and 25 mg in the PM, Disp: , Rfl:    Multiple Vitamins-Minerals (MULTIVITAMIN WITH MINERALS) tablet, Take 1 tablet by mouth daily., Disp: , Rfl:    nitroGLYCERIN (NITROSTAT) 0.4 MG SL tablet, Place 1 tablet (0.4 mg total) under the tongue every 5 (five) minutes as needed for chest pain., Disp: 25 tablet, Rfl: 1   pantoprazole (PROTONIX) 40 MG tablet, Take 1 tablet (40 mg total) by mouth daily., Disp: 60 tablet, Rfl: 6   Pitavastatin Calcium (LIVALO) 1 MG TABS, Take 1 tablet by mouth., Disp: , Rfl:    Semaglutide,0.25 or 0.5MG/DOS, (OZEMPIC, 0.25 OR 0.5 MG/DOSE,) 2 MG/3ML SOPN, Inject 0.5 mg into the skin every 7 (seven) weeks., Disp: 3 mL, Rfl: 3   spironolactone (ALDACTONE) 25 MG tablet, TAKE 1 TABLET BY MOUTH EVERY DAY, Disp: 90 tablet, Rfl: 3   thiamine (VITAMIN B-1) 100 MG tablet, Take 100 mg by mouth daily., Disp: , Rfl:   Review of Systems:  Constitutional: Denies fever, chills, diaphoresis, appetite change and fatigue.  HEENT: Denies photophobia, eye pain, redness, hearing loss, ear pain, congestion, sore throat, rhinorrhea, sneezing, mouth sores, trouble swallowing, neck pain, neck stiffness and tinnitus.   Respiratory: Denies SOB, DOE, cough, chest tightness,  and wheezing.   Cardiovascular: Denies chest pain, palpitations and leg swelling.  Gastrointestinal: Denies nausea, vomiting, abdominal pain, diarrhea, constipation, blood in stool and abdominal distention.  Genitourinary: Denies dysuria, urgency, frequency, hematuria, flank pain and difficulty urinating.  Endocrine: Denies: hot or cold intolerance, sweats, changes in hair or nails, polyuria, polydipsia. Musculoskeletal: Denies myalgias, back pain, joint swelling, arthralgias and gait problem.  Skin: Denies pallor, rash and wound.  Neurological: Denies dizziness, seizures, syncope, weakness, light-headedness, numbness and headaches.  Hematological: Denies adenopathy. Easy  bruising, personal or family bleeding history  Psychiatric/Behavioral: Denies suicidal ideation, mood changes, confusion, nervousness, sleep disturbance and agitation    Physical Exam: Vitals:   04/07/22 0854  BP: 110/68  Pulse: 82  Temp: 98.2 F (36.8 C)  TempSrc: Oral  SpO2: 98%  Weight: 143 lb 6.4 oz (65 kg)    Body mass index is 28.96 kg/m.   Constitutional: NAD, calm, comfortable Eyes: PERRL, lids and conjunctivae normal ENMT: Mucous membranes are moist.  Respiratory: clear to auscultation bilaterally, no wheezing, no crackles. Normal respiratory effort. No accessory muscle use.  Cardiovascular: Regular rate and rhythm, no murmurs / rubs / gallops. No extremity edema.  Psychiatric: Normal judgment and insight. Alert and oriented x 3. Normal mood.    Impression and Plan:  Non-insulin treated type 2 diabetes mellitus ([place])  - Plan: POCT glycosylated hemoglobin (  Hb A1C) -Excellent control with an A1c of 6.0. -Has her diabetic eye exam scheduled for later this month.  Elevated LDL cholesterol level -On Livalo.  Followed by cards.  Chronic diastolic heart failure (HCC) -Compensated, followed by cardiology  Need for influenza vaccination -Flu vaccine today.  Need for shingles vaccine -Final shingles vaccine today    Time spent:31 minutes reviewing chart, interviewing and examining patient and formulating plan of care.    Lelon Frohlich, MD [facility]

## 2022-04-07 NOTE — Addendum Note (Signed)
Addended by: Westley Hummer B on: 04/07/2022 10:35 AM   Modules accepted: Orders

## 2022-04-08 LAB — HM DIABETES EYE EXAM

## 2022-04-18 ENCOUNTER — Emergency Department (HOSPITAL_COMMUNITY): Payer: 59

## 2022-04-18 ENCOUNTER — Emergency Department (HOSPITAL_COMMUNITY)
Admission: EM | Admit: 2022-04-18 | Discharge: 2022-04-19 | Disposition: A | Discharge status: Home / Self Care | Payer: 59 | Attending: Emergency Medicine | Admitting: Emergency Medicine

## 2022-04-18 DIAGNOSIS — Z7982 Long term (current) use of aspirin: Secondary | ICD-10-CM | POA: Insufficient documentation

## 2022-04-18 DIAGNOSIS — S0990XA Unspecified injury of head, initial encounter: Secondary | ICD-10-CM

## 2022-04-18 DIAGNOSIS — Z79899 Other long term (current) drug therapy: Secondary | ICD-10-CM | POA: Diagnosis not present

## 2022-04-18 DIAGNOSIS — S0003XA Contusion of scalp, initial encounter: Secondary | ICD-10-CM | POA: Insufficient documentation

## 2022-04-18 DIAGNOSIS — W01198A Fall on same level from slipping, tripping and stumbling with subsequent striking against other object, initial encounter: Secondary | ICD-10-CM | POA: Diagnosis not present

## 2022-04-18 NOTE — ED Provider Triage Note (Signed)
Emergency Medicine Provider Triage Evaluation Note  Emily [name] , a 61 y.o. female  was evaluated in triage.  Pt complains of head pain after a fall about 2 hours ago.  Patient and her husband were at a hotel walking across the lobby to dinner.  Was watching some people take pictures with her head turned, hit her head on a spiral metal staircase, fell backwards and then hit the back of her head on a marble floor.  Denies LOC.  Some mild nausea, however no vomiting.  Denies vision changes, however with some stiffness and pain.  Not on anticoagulation.  Review of Systems  Positive:  Negative: See above  Physical Exam  BP 131/80 (BP Location: Left Arm)   Pulse 78   Temp 97.6 F (36.4 C) (Oral)   Resp 18   Ht 4' 11.5" (1.511 m)   Wt 65.3 kg   LMP 10/14/2014   SpO2 99%   BMI 28.60 kg/m  Gen:   Awake, no distress   Resp:  Normal effort  MSK:   Moves extremities without difficulty  Other:  PERRLA.  Normal EOMs.  Small hematoma on the left forehead/scalp, moderate hematoma on the posterior right crown.  AAOx4.  Head otherwise atraumatic.  Medical Decision Making  Medically screening exam initiated at 10:01 PM.  Appropriate orders placed.  Emily [name] Emily [name] was informed that the remainder of the evaluation will be completed by another provider, this initial triage assessment does not replace that evaluation, and the importance of remaining in the ED until their evaluation is complete.     Emily Rome, PA-C 36/62/94 2206

## 2022-04-18 NOTE — ED Triage Notes (Signed)
Pt reports that she hit her head on a staircase and fell back on to a marble floor at the proximity. States that she is on "a lot of heart medication." Denies anticoagulants, but is on aspirin. No LOC. No vomiting. No external hemorrhage.

## 2022-04-19 NOTE — ED Provider Notes (Signed)
[place] DEPT  Provider Note  CSN: [id number] Arrival date & time: 04/18/22 2129  History Chief Complaint  Patient presents with   Head Injury    Emily [name] is a 61 y.o. female reports she hit her forehead on a spiral staircase while walking through [employer] earlier tonight which caused her to lose her balance and fall backwards, striking the back of her head on the hard floor. She is not on anticoagulation. Did not have LOC. She was feeling well after sitting for a few minutes and proceeded to have dinner before coming to the ED for evaluation. She reports soreness to a hematoma on her R parietal scalp, but otherwise no reported injuries.    Home Medications Prior to Admission medications   Medication Sig Start Date End Date Taking? Authorizing Provider  acetaminophen (TYLENOL) 500 MG tablet Take 500 mg by mouth every 6 (six) hours as needed for moderate pain.    [provider]  aspirin 81 MG tablet Take 81 mg by mouth daily.    [provider]  Blood Glucose Monitoring Suppl (ONE TOUCH ULTRA 2) w/Device KIT USE TO TEST UP TO 4 TIMES PER DAY. 08/04/16   [name], MD  cholecalciferol (VITAMIN D) 1000 UNITS tablet Take 1,000 Units by mouth daily.    [provider]  Dapagliflozin-metFORMIN HCl ER (XIGDUO XR) 11-998 MG TB24 Take 1 tablet by mouth 2 (two) times daily.    [provider]  ENTRESTO 24-26 MG TAKE 1 TABLET BY MOUTH TWICE A DAY 11/29/21   Larey Dresser, MD  furosemide (LASIX) 20 MG tablet Take 20 mg by mouth as needed.    [provider]  glucose blood (ONE TOUCH ULTRA TEST) test strip USE TO TEST UP TO 4 TIMES A DAY. 07/13/18   [name], MD  Guaifenesin [facility] MAXIMUM STRENGTH) 1200 MG TB12 Take 1 tablet (1,200 mg total) by mouth every 12 (twelve) hours as needed. 07/13/18   [name], MD  ivabradine (CORLANOR) 5 MG TABS tablet Take 1 tablet (5 mg total) by mouth 2 (two)  times daily with a meal. 11/18/21   Larey Dresser, MD  Metoprolol Succinate 25 MG CS24 Take by mouth. 50 mg in the AM and 25 mg in the PM    [provider]  Multiple Vitamins-Minerals (MULTIVITAMIN WITH MINERALS) tablet Take 1 tablet by mouth daily.    [provider]  nitroGLYCERIN (NITROSTAT) 0.4 MG SL tablet Place 1 tablet (0.4 mg total) under the tongue every 5 (five) minutes as needed for chest pain. 11/08/16   Skeet Latch, MD  pantoprazole (PROTONIX) 40 MG tablet Take 1 tablet (40 mg total) by mouth daily. 05/19/21   Larey Dresser, MD  Pitavastatin Calcium (LIVALO) 1 MG TABS Take 1 tablet by mouth.    [provider]  Semaglutide,0.25 or 0.5MG/DOS, (OZEMPIC, 0.25 OR 0.5 MG/DOSE,) 2 MG/3ML SOPN Inject 0.5 mg into the skin every 7 (seven) weeks. 01/17/22   Isaac Bliss, Rayford Halsted, MD  spironolactone (ALDACTONE) 25 MG tablet TAKE 1 TABLET BY MOUTH EVERY DAY 11/29/21   Larey Dresser, MD  thiamine (VITAMIN B-1) 100 MG tablet Take 100 mg by mouth daily.    [provider]     Allergies    Hornet venom, Metoprolol, and Tetanus toxoids   Review of Systems   Review of Systems Please see HPI for pertinent positives and negatives  Physical Exam  BP 131/80 (BP Location: Left Arm)   Pulse 78   Temp 97.6 F (36.4 C) (Oral)   Resp 18   Ht 4' 11.5" (1.511 m)   Wt 65.3 kg   LMP 10/14/2014   SpO2 99%   BMI 28.60 kg/m   Physical Exam Vitals and nursing note reviewed.  Constitutional:      Appearance: Normal appearance.  HENT:     Head: Normocephalic.     Comments: Scalp hematoma, no bleeding    Nose: Nose normal.     Mouth/Throat:     Mouth: Mucous membranes are moist.  Eyes:     Extraocular Movements: Extraocular movements intact.     Conjunctiva/sclera: Conjunctivae normal.  Cardiovascular:     Rate and Rhythm: Normal rate.  Pulmonary:     Effort: Pulmonary effort is normal.     Breath sounds: Normal breath sounds.  Abdominal:      General: Abdomen is flat.     Palpations: Abdomen is soft.     Tenderness: There is no abdominal tenderness.  Musculoskeletal:        General: No swelling. Normal range of motion.     Cervical back: Neck supple. No tenderness.  Skin:    General: Skin is warm and dry.  Neurological:     General: No focal deficit present.     Mental Status: She is alert.  Psychiatric:        Mood and Affect: Mood normal.     ED Results / Procedures / Treatments   EKG None  Procedures Procedures  Medications Ordered in the ED Medications - No data to display  Initial Impression and Plan  Patient here after mechanical fall and head injury. I personally viewed the images from radiology studies and agree with radiologist interpretation: CT neg for intracranial injury. Patient given head injury precautions APAP as needed for headache and advised to RTED for any other concerns.    ED Course       MDM Rules/Calculators/A&P Medical Decision Making Problems Addressed: Injury of head, initial encounter: acute illness or injury  Amount and/or Complexity of Data Reviewed Radiology: ordered and independent interpretation performed. Decision-making details documented in ED Course.  Risk OTC drugs.    Final Clinical Impression(s) / ED Diagnoses Final diagnoses:  Injury of head, initial encounter    Rx / DC Orders ED Discharge Orders     None        Truddie Hidden, MD 04/19/22 0040

## 2022-05-03 ENCOUNTER — Ambulatory Visit (HOSPITAL_COMMUNITY)
Admission: RE | Admit: 2022-05-03 | Discharge: 2022-05-03 | Disposition: A | Discharge status: Home / Self Care | Payer: 59 | Source: Ambulatory Visit | Attending: Cardiology | Admitting: Cardiology

## 2022-05-03 VITALS — BP 120/70 | HR 72 | Wt 141.8 lb

## 2022-05-03 DIAGNOSIS — E119 Type 2 diabetes mellitus without complications: Secondary | ICD-10-CM | POA: Insufficient documentation

## 2022-05-03 DIAGNOSIS — I5032 Chronic diastolic (congestive) heart failure: Secondary | ICD-10-CM | POA: Diagnosis not present

## 2022-05-03 DIAGNOSIS — Z8349 Family history of other endocrine, nutritional and metabolic diseases: Secondary | ICD-10-CM | POA: Diagnosis not present

## 2022-05-03 DIAGNOSIS — I428 Other cardiomyopathies: Secondary | ICD-10-CM | POA: Insufficient documentation

## 2022-05-03 DIAGNOSIS — E785 Hyperlipidemia, unspecified: Secondary | ICD-10-CM | POA: Diagnosis not present

## 2022-05-03 DIAGNOSIS — Z833 Family history of diabetes mellitus: Secondary | ICD-10-CM | POA: Insufficient documentation

## 2022-05-03 DIAGNOSIS — I5022 Chronic systolic (congestive) heart failure: Secondary | ICD-10-CM | POA: Insufficient documentation

## 2022-05-03 DIAGNOSIS — Z7984 Long term (current) use of oral hypoglycemic drugs: Secondary | ICD-10-CM | POA: Insufficient documentation

## 2022-05-03 DIAGNOSIS — E669 Obesity, unspecified: Secondary | ICD-10-CM | POA: Diagnosis not present

## 2022-05-03 DIAGNOSIS — Z8249 Family history of ischemic heart disease and other diseases of the circulatory system: Secondary | ICD-10-CM | POA: Insufficient documentation

## 2022-05-03 DIAGNOSIS — Z79899 Other long term (current) drug therapy: Secondary | ICD-10-CM | POA: Diagnosis not present

## 2022-05-03 DIAGNOSIS — K219 Gastro-esophageal reflux disease without esophagitis: Secondary | ICD-10-CM | POA: Diagnosis not present

## 2022-05-03 DIAGNOSIS — Z8616 Personal history of COVID-19: Secondary | ICD-10-CM | POA: Diagnosis not present

## 2022-05-03 LAB — BASIC METABOLIC PANEL
Anion gap: 9 (ref 5–15)
BUN: 12 mg/dL (ref 8–23)
CO2: 24 mmol/L (ref 22–32)
Calcium: 9.3 mg/dL (ref 8.9–10.3)
Chloride: 105 mmol/L (ref 98–111)
Creatinine, Ser: 0.91 mg/dL (ref 0.44–1.00)
GFR, Estimated: >60 mL/min (ref >60)
Glucose, Bld: 112 mg/dL — ABNORMAL HIGH (ref 70–99)
Potassium: 4.1 mmol/L (ref 3.5–5.1)
Sodium: 138 mmol/L (ref 135–145)

## 2022-05-03 LAB — BRAIN NATRIURETIC PEPTIDE: B Natriuretic Peptide: 13.5 pg/mL (ref 0.0–100.0)

## 2022-05-03 NOTE — Patient Instructions (Signed)
There has been no changes to your medications  Labs done today, your results will be available in MyChart, we will contact you for abnormal readings.  Repeat blood work in 3 months.  Your provider has recommended that you have a home sleep study.  We have provided you with the equipment in our office today. Please download the app and follow the instructions. YOUR PIN NUMBER IS: [id number]. Once you have completed the test you just dispose of the equipment, the information is automatically uploaded to [place] via blue-tooth technology. If your test is positive for sleep apnea and you need a home CPAP machine you will be contacted by Dr Theodosia Blender office [facility]) to set this up.  Your physician recommends that you schedule a follow-up appointment in: 6 months ( April 2024)  ** please call the office in February to arrange your follow up**  If you have any questions or concerns before your next appointment please send [place] a message through [place] or call our office at [phone].    TO LEAVE A MESSAGE FOR THE NURSE SELECT OPTION 2, PLEASE LEAVE A MESSAGE INCLUDING: YOUR NAME DATE OF BIRTH CALL BACK NUMBER REASON FOR CALL**this is important as we prioritize the call backs  YOU WILL RECEIVE A CALL BACK THE SAME DAY AS LONG AS YOU CALL BEFORE 4:00 PM  At the [facility], you and your health needs are our priority. As part of our continuing mission to provide you with exceptional heart care, we have created designated Provider Care Teams. These Care Teams include your primary Cardiologist (physician) and Advanced Practice Providers (APPs- Physician Assistants and Nurse Practitioners) who all work together to provide you with the care you need, when you need it.   You may see any of the following providers on your designated Care Team at your next follow up: Dr Glori Bickers Dr [name] Dr. Roxana Hires, NP Lyda Jester, [place] [facility] [place], [place] Forestine Na, NP Audry Riles, PharmD   Please be sure to bring in all your medications bottles to every appointment.

## 2022-05-03 NOTE — Progress Notes (Signed)
Height:     Weight: BMI:  Today's Date:  STOP BANG RISK ASSESSMENT S (snore) Have you been told that you snore?     YES   T (tired) Are you often tired, fatigued, or sleepy during the day?   YES  O (obstruction) Do you stop breathing, choke, or gasp during sleep? NO   P (pressure) Do you have or are you being treated for high blood pressure? YES   B (BMI) Is your body index greater than 35 kg/m? NO   A (age) Are you 62 years old or older? YES   N (neck) Do you have a neck circumference greater than 16 inches?   NO   G (gender) Are you a female? NO   TOTAL STOP/BANG "YES" ANSWERS 4                                                                       For Office Use Only              Procedure Order Form    YES to 3+ Stop Bang questions OR two clinical symptoms - patient qualifies for WatchPAT (CPT 95800)      Clinical Notes: Will consult Sleep Specialist and refer for management of therapy due to patient increased risk of Sleep Apnea. Ordering a sleep study due to the following two clinical symptoms: Excessive daytime sleepiness G47.10 / Gastroesophageal reflux K21.9 / Nocturia R35.1 / Morning Headaches G44.221 / Difficulty concentrating R41.840 / Memory problems or poor judgment G31.84 / Personality changes or irritability R45.4 / Loud snoring R06.83 / Depression F32.9 / Unrefreshed by sleep G47.8 / Impotence N52.9 / History of high blood pressure R03.0 / Insomnia G47.00        

## 2022-05-04 ENCOUNTER — Other Ambulatory Visit: Payer: Self-pay | Admitting: Internal Medicine

## 2022-05-04 NOTE — Progress Notes (Signed)
Advanced Heart Failure Clinic Note   Referring Physician: PCP: Emily [name], Emily Halsted, MD HF Cardiology: Dr. Aundra Dubin  HPI:  Emily [name] is a 61 y.o. female with Chronic systolic CHF, NICM, mitral regurgitation, DM2, GERD, HDL, and Obesity.    Admitted 11/16 - 06/21/18 with acute systolic CHF. Noted by be NICM by cMRI and cath. At least moderate, functional MR noted. Diuresed with IV lasix and meds adjusted as tolerated.  Echo in 3/20 showed EF 30-35%, diffuse hypokinesis, mild MR.  Echo in 7/20 showed EF up to 40-45%.  Echo in 4/21 showed EF up to 50% with normal RV.  Echo in 4/22 showed EF 45-50%, diffuse hypokinesis, normal RV.   Last OV 4/23 , she noted increase in baseline HR and symptomatic w/ palpitations. EKG showed sinus tach. Soft BP limited titration of metoprolol. She was started on ivabradine and Zio was placed to r/o Afib.  Zio showed predominant NSR, no Afib, rare PACs and PVCs and 2 short runs of NSVT.   Patient returns for followup of CHF.  HR is much lower on ivabradine. She feels better, no longer has sensation of heart racing.  She has had mechanical falls x 2 since last appointment, tripped. No lightheadedness/orthostatic symptoms. She reports that her oxygen saturation drops at night.  She is fatigued during the day. No chest pain.   ECG (personally reviewed):  NSR, low voltage, nonspecific T wave flattening.   Labs (1/20): K 3.9, creatinine 0.97  Labs (3/20): K 3.9, creatinine 0.95 Labs (7/20): TSH normal, hgb 14.3 Labs (8/20): LDL 100, K 4.8, creatinine 0.95 Labs (1/21): K 3.6, creatinine 0.92 Labs (7/21): K 3.9, creatinine 0.89 Labs (12/21): K 4, creatinine 0.92, LDL 72 Labs (7/22): K 4.2, creatinine 0.85 Labs (10/22): BNP 21.6, K 4, creatinine 0.93 Labs (4/23) K 3.8, SCr 0.94, BNP 19  Labs (6/23): K 3.9, creatinine 0.83, hgb 14.3, LDL 80  Review of systems complete and found to be negative unless listed in HPI.    Past Medical History 1.  Chronic systolic CHF: No significant coronary disease on LHC in 11/19. RHC (11/19) with mean RA 6, PA 42/20, mean PCWP 25, CI 1.8, PVR 1.7 WU. cMRI (11/19) showed EF 28%, mildly decreased RV size, and no myocardial LGE.  - TEE 06/20/18: LVEF 25-30%, Normal RV size and mildly decreased function, Mod LAE, Small PFO, Mild TR, Trivial PI, moderate-severe MR but looked more severe visually. MR is central suggestive of functional MR.  - Echo (3/20): EF 30-35%, diffuse hypokinesis, mild MR.  - Echo (7/20): EF 40-45% - Echo (4/21): EF 50%, normal RV.  - Echo (4/22): EF 45-50%, diffuse hypokinesis, normal RV.  2. Mitral regurgitation: Moderate-severe functional MR by TEE in 11/19.  Echo (4/21) with only mild MR.  3. DM2 4. Hyperlipidemia.  5. GERD 6. Zio patch x 7 days (9/20): short runs SVT, NSVT (rare).  7. COVID-19 infection: 2/21.   Current Outpatient Medications  Medication Sig Dispense Refill   acetaminophen (TYLENOL) 500 MG tablet Take 500 mg by mouth every 6 (six) hours as needed for moderate pain.     aspirin 81 MG tablet Take 81 mg by mouth daily.     Blood Glucose Monitoring Suppl (ONE TOUCH ULTRA 2) w/Device KIT USE TO TEST UP TO 4 TIMES PER DAY. 1 each 1   cholecalciferol (VITAMIN D) 1000 UNITS tablet Take 1,000 Units by mouth daily.     Dapagliflozin-metFORMIN HCl ER (XIGDUO XR) 11-998 MG TB24 Take  1 tablet by mouth 2 (two) times daily.     ENTRESTO 24-26 MG TAKE 1 TABLET BY MOUTH TWICE A DAY 180 tablet 2   furosemide (LASIX) 20 MG tablet Take 20 mg by mouth as needed.     glucose blood (ONE TOUCH ULTRA TEST) test strip USE TO TEST UP TO 4 TIMES A DAY. 100 each 11   Guaifenesin (MUCINEX MAXIMUM STRENGTH) 1200 MG TB12 Take 1 tablet (1,200 mg total) by mouth every 12 (twelve) hours as needed. 14 tablet 1   ivabradine (CORLANOR) 5 MG TABS tablet Take 1 tablet (5 mg total) by mouth 2 (two) times daily with a meal. 180 tablet 3   Metoprolol Succinate 25 MG CS24 Take by mouth. 50 mg in the AM  and 25 mg in the PM     Multiple Vitamins-Minerals (MULTIVITAMIN WITH MINERALS) tablet Take 1 tablet by mouth daily.     nitroGLYCERIN (NITROSTAT) 0.4 MG SL tablet Place 1 tablet (0.4 mg total) under the tongue every 5 (five) minutes as needed for chest pain. 25 tablet 1   pantoprazole (PROTONIX) 40 MG tablet Take 1 tablet (40 mg total) by mouth daily. 60 tablet 6   Pitavastatin Calcium (LIVALO) 1 MG TABS Take 1 tablet by mouth.     spironolactone (ALDACTONE) 25 MG tablet TAKE 1 TABLET BY MOUTH EVERY DAY 90 tablet 3   thiamine (VITAMIN B-1) 100 MG tablet Take 100 mg by mouth daily.     OZEMPIC, 0.25 OR 0.5 MG/DOSE, 2 MG/3ML SOPN INJECT 0.5 MG INTO THE SKIN EVERY 7 (SEVEN) WEEKS. 2 mL 3   No current facility-administered medications for this encounter.    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  Education: [employer].         Social Determinants of Health   Financial Resource Strain: Low Risk  (11/17/2021)   Overall Financial Resource Strain (CARDIA)    Difficulty of Paying Living Expenses: Not hard at all  Food Insecurity: No Food Insecurity (11/17/2021)   Hunger Vital Sign    Worried About Running Out of Food in the Last Year: Never true    Ran  Out of Food in the Last Year: Never true  Transportation Needs: No Transportation Needs (11/17/2021)   PRAPARE - Hydrologist (Medical): No    Lack of Transportation (Non-Medical): No  Physical Activity: Not on file  Stress: Not on file  Social Connections: Not on file  Intimate Partner Violence: Not on file      Family History  Problem Relation Age of Onset  Heart disease Mother    COPD Mother    Emphysema Mother    Cancer Mother        oral   Heart disease Father    Diabetes Father    COPD Father    Vision loss Father    Skin cancer Father        skin   Hyperlipidemia Father    Hypertension Father    Heart disease Sister    [name]' disease Sister    Diabetes Sister    Hyperlipidemia Sister    Heart failure Sister 8   Heart disease Brother    Hyperlipidemia Brother    Cancer Paternal Grandmother        unknown   Prostate cancer Paternal Grandfather        Prostate cancer   Breast cancer Neg Hx     Vitals:   05/03/22 1441  BP: 120/70  Pulse: 72  SpO2: 97%  Weight: 64.3 kg (141 lb 12.8 oz)     Wt Readings from Last 3 Encounters:  05/03/22 64.3 kg (141 lb 12.8 oz)  04/18/22 65.3 kg (144 lb)  04/07/22 65 kg (143 lb 6.4 oz)     PHYSICAL EXAM: General: NAD Neck: No JVD, no thyromegaly or thyroid nodule.  Lungs: Clear to auscultation bilaterally with normal respiratory effort. CV: Nondisplaced PMI.  Heart regular S1/S2, no S3/S4, no murmur.  No peripheral edema.  No carotid bruit.  Normal pedal pulses.  Abdomen: Soft, nontender, no hepatosplenomegaly, no distention.  Skin: Intact without lesions or rashes.  Neurologic: Alert and oriented x 3.  Psych: Normal affect. Extremities: No clubbing or cyanosis.  HEENT: Normal.   ASSESSMENT & PLAN:  1. Chronic systolic CHF: Nonischemic cardiomyopathy by [facility] 06/2018. cMRI showed EF 28%, mildly decreased RV size, and no myocardial LGE. Echo in 7/20 showed EF up to 40-45% and echo in  4/21 showed EF up to 50%.  Echo in 4/22 with stable EF 45-50%. NYHA Class II (stable). She is not volume overloaded on exam, weight down 6 lbs. NYHA class II. SBP in the 90s-100s often at home.  - Continue prn use of Lasix.   - Continue Toprol XL 50 qam/25 qpm.  - Continue Entresto 24/26 mg BID. BP low at times at home, will not increase.  - Continue spironolactone 25 mg daily.  BMET today.  - Continue Farxiga 10 mg daily.  - Continue Ivabradine 5 mg bid  2. Hyperlipidemia: On Livalo.  3. OSA: Concern for OSA with fatigue and fall in oxygen saturation at night.  - Arrange for home sleep study.   Followup 6 months with APP, will need BMET in 3 months.   [name], 05/04/2022

## 2022-05-11 ENCOUNTER — Other Ambulatory Visit (HOSPITAL_COMMUNITY): Payer: Self-pay | Admitting: Cardiology

## 2022-05-11 ENCOUNTER — Telehealth: Payer: Self-pay | Admitting: Internal Medicine

## 2022-05-11 NOTE — Telephone Encounter (Signed)
Refill request pantoprazole (PROTONIX) 40 MG tablet and livalis,and xigduo . Says these were prescribed by another provider.   [id number]  [phone] Fax:  [phone]

## 2022-05-12 ENCOUNTER — Other Ambulatory Visit: Payer: Self-pay | Admitting: Internal Medicine

## 2022-05-12 ENCOUNTER — Telehealth (HOSPITAL_COMMUNITY): Payer: Self-pay | Admitting: Surgery

## 2022-05-12 DIAGNOSIS — E7800 Pure hypercholesterolemia, unspecified: Secondary | ICD-10-CM

## 2022-05-12 DIAGNOSIS — K219 Gastro-esophageal reflux disease without esophagitis: Secondary | ICD-10-CM

## 2022-05-12 DIAGNOSIS — E119 Type 2 diabetes mellitus without complications: Secondary | ICD-10-CM

## 2022-05-12 MED ORDER — XIGDUO XR 5-1000 MG PO TB24: 1.0000 | ORAL_TABLET | Freq: Two times a day (BID) | ORAL | 1 refills | Status: DC

## 2022-05-12 MED ORDER — PANTOPRAZOLE SODIUM 40 MG PO TBEC: 40.0000 mg | DELAYED_RELEASE_TABLET | Freq: Every day | ORAL | 1 refills | Status: DC

## 2022-05-12 MED ORDER — LIVALO 1 MG PO TABS: 1.0000 | ORAL_TABLET | Freq: Every day | ORAL | 1 refills | Status: DC

## 2022-05-12 NOTE — Telephone Encounter (Signed)
Noted  

## 2022-05-12 NOTE — Telephone Encounter (Signed)
I called patient to let her know that she can proceed with ordered home sleep study as insurance precert not needed.She tells me that she will complete very soon.

## 2022-05-17 ENCOUNTER — Encounter (HOSPITAL_BASED_OUTPATIENT_CLINIC_OR_DEPARTMENT_OTHER): Payer: 59 | Admitting: Cardiology

## 2022-05-17 DIAGNOSIS — R0683 Snoring: Secondary | ICD-10-CM | POA: Diagnosis not present

## 2022-05-17 DIAGNOSIS — I493 Ventricular premature depolarization: Secondary | ICD-10-CM

## 2022-05-17 DIAGNOSIS — I5041 Acute combined systolic (congestive) and diastolic (congestive) heart failure: Secondary | ICD-10-CM | POA: Diagnosis not present

## 2022-05-17 DIAGNOSIS — I428 Other cardiomyopathies: Secondary | ICD-10-CM

## 2022-05-22 NOTE — Procedures (Signed)
   Study Date: 05/17/22 Patient Name: Emily [name] Patient ID: [id number] Birth Date: 2060/08/18 Age: 61 Gender: Female BMI: 28.4 (W=141 lb, H=4' 11'') Stopbang: 4 Referring Physician: [name], MD  TEST DESCRIPTION: Home sleep apnea testing was completed using the WatchPat, a Type 1 device, utilizing peripheral arterial tonometry (PAT), chest movement, actigraphy, pulse oximetry, pulse rate, body position and snore. AHI was calculated with apnea and hypopnea using valid sleep time as the denominator. RDI includes apneas, hypopneas, and RERAs. The data acquired and the scoring of sleep and all associated events were performed in accordance with the recommended standards and specifications as outlined in the AASM Manual for the Scoring of Sleep and Associated Events 2.2.0 (2015).   FINDINGS: 1.  No evidence of Obstructive Sleep Apnea with AHI 0.9/hr.  2.  No Central Sleep Apnea. 3.  Oxygen desaturations as low as 89%. 4.  Minimal snoring was present. O2 sats were < 88% for 0 minutes. 5.  Total sleep time was 8 hrs and 6 min. 6.  25.9% of total sleep time was spent in REM sleep.  7.  Normal sleep onset latency at 13 min.  8.  Normal REM sleep onset latency at 84 min.  9.  Total awakenings were 7.   DIAGNOSIS:  Normal study with no significant sleep disordered breathing.  RECOMMENDATIONS:   1. Normal study with no significant sleep disordered breathing.  2.  Healthy sleep recommendations include:  adequate nightly sleep (normal 7-9 hrs/night), avoidance of caffeine after noon and alcohol near bedtime, and maintaining a sleep environment that is cool, dark and quiet.  3.  Weight loss for overweight patients is recommended.    4.  Snoring recommendations include:  weight loss where appropriate, side sleeping, and avoidance of alcohol before bed.  5.  Operation of motor vehicle or dangerous equipment must be avoided when feeling drowsy, excessively sleepy, or mentally fatigued.     6.  An ENT consultation which may be useful for specific causes of and possible treatment of bothersome snoring.   7. Weight loss may be of benefit in reducing the severity of snoring.   Signature: Fransico Him, MD; [facility]; [place], [place] Board of Sleep Medicine Electronically Signed: 05/22/22

## 2022-05-23 ENCOUNTER — Ambulatory Visit: Payer: 59 | Attending: Cardiology

## 2022-05-23 DIAGNOSIS — I5032 Chronic diastolic (congestive) heart failure: Secondary | ICD-10-CM

## 2022-05-31 ENCOUNTER — Telehealth: Payer: Self-pay | Admitting: Internal Medicine

## 2022-05-31 ENCOUNTER — Other Ambulatory Visit (HOSPITAL_COMMUNITY): Payer: Self-pay | Admitting: Cardiology

## 2022-05-31 MED ORDER — GLUCOSE BLOOD VI STRP: ORAL_STRIP | 11 refills | Status: DC

## 2022-05-31 NOTE — Telephone Encounter (Signed)
Pt call and stated she need a refill on [facility] ULTRA test strip sent [facility]  [id number]  [phone] Fax:  [phone]

## 2022-05-31 NOTE — Telephone Encounter (Signed)
Refill sent.

## 2022-06-21 ENCOUNTER — Telehealth: Payer: Self-pay | Admitting: *Deleted

## 2022-06-21 NOTE — Telephone Encounter (Signed)
-----   Message from Lauralee Evener, [place] sent at 05/23/2022  8:31 AM EDT -----  ----- Message ----- From: Sueanne Margarita, MD Sent: 05/22/2022   7:02 PM EDT To: Cv Div Sleep Studies  Please let patient know that sleep study showed no significant sleep apnea.

## 2022-06-21 NOTE — Telephone Encounter (Signed)
The patient has been notified of the result and verbalized understanding.  All questions (if any) were answered. Marolyn Hammock, [place] 06/21/2022 5:23 PM    Pt is aware and agreeable to normal results.

## 2022-07-06 ENCOUNTER — Ambulatory Visit: Payer: 59 | Admitting: Internal Medicine

## 2022-07-06 ENCOUNTER — Encounter: Payer: Self-pay | Admitting: Internal Medicine

## 2022-07-06 VITALS — BP 102/64 | HR 88 | Temp 97.6°F | Ht 59.5 in | Wt 141.0 lb

## 2022-07-06 DIAGNOSIS — I5041 Acute combined systolic (congestive) and diastolic (congestive) heart failure: Secondary | ICD-10-CM | POA: Diagnosis not present

## 2022-07-06 DIAGNOSIS — E7800 Pure hypercholesterolemia, unspecified: Secondary | ICD-10-CM | POA: Diagnosis not present

## 2022-07-06 DIAGNOSIS — R059 Cough, unspecified: Secondary | ICD-10-CM

## 2022-07-06 DIAGNOSIS — E119 Type 2 diabetes mellitus without complications: Secondary | ICD-10-CM

## 2022-07-06 LAB — POCT GLYCOSYLATED HEMOGLOBIN (HGB A1C): Hemoglobin A1C: 6.1 % — AB (ref 4.0–5.6)

## 2022-07-06 LAB — POC COVID19 BINAXNOW: SARS Coronavirus 2 Ag: NEGATIVE

## 2022-07-06 LAB — POCT INFLUENZA A/B
Influenza A, POC: NEGATIVE
Influenza B, POC: NEGATIVE

## 2022-07-06 NOTE — Progress Notes (Signed)
Established Patient Office Visit     CC/Reason for Visit: 91-monthfollow-up chronic conditions  HPI: Emily [name] a 61y.o. female who is coming in today for the above mentioned reasons. Past Medical History is significant for: Type 2 diabetes, hyperlipidemia, combined chronic CHF, mitral valve regurgitation.  She is followed by cardiology.  She has no questions today.  She has been having URI symptoms mainly consisting of cough, postnasal drip and nasal congestion.  One of her granddaughters was sick over the holidays.   Past Medical/Surgical History: Past Medical History:  Diagnosis Date   Acute combined systolic and diastolic heart failure ([facility] 06/16/2018   Allergy    CHF (congestive heart failure) (HCC)    GERD (gastroesophageal reflux disease)    NICM (nonischemic cardiomyopathy) ([facility] 06/21/2018   NSVT (nonsustained ventricular tachycardia) ([facility] 06/13/2015   PVC's (premature ventricular contractions) 06/11/2015   S/P cardiac cath, 06/20/18 06/21/2018   Type 2 diabetes mellitus ([facility]     Past Surgical History:  Procedure Laterality Date   CARDIAC CATHETERIZATION N/A 06/19/2015   Procedure: Left Heart Cath and Coronary Angiography;  Surgeon: DLeonie Man MD;  [facility];  Service: Cardiovascular;  Laterality: N/A;   RIGHT/LEFT HEART CATH AND CORONARY ANGIOGRAPHY N/A 06/19/2018   Procedure: RIGHT/LEFT HEART CATH AND CORONARY ANGIOGRAPHY;  Surgeon: KTroy Sine MD;  [facility];  Service: Cardiovascular;  Laterality: N/A;   TEE WITHOUT CARDIOVERSION N/A 06/20/2018   Procedure: TRANSESOPHAGEAL ECHOCARDIOGRAM (TEE);  Surgeon: MLarey Dresser MD;  [facility];  Service: Cardiovascular;  Laterality: N/A;   TONSILLECTOMY     when she was 3    Social History:  reports that she has never smoked. She has never used smokeless tobacco. She reports current alcohol use. She reports that she does not use  drugs.  Allergies: Allergies  Allergen Reactions   Hornet Venom Swelling   Metoprolol     depression   Tetanus Toxoids Other (See Comments)    Big red rash and swelling in the muscle    Family History:  Family History  Problem Relation Age of Onset   Heart disease Mother    COPD Mother    Emphysema Mother    Cancer Mother        oral   Heart disease Father    Diabetes Father    COPD Father    Vision loss Father    Skin cancer Father        skin   Hyperlipidemia Father    Hypertension Father    Heart disease Sister    Graves' disease Sister    Diabetes Sister    Hyperlipidemia Sister    Heart failure Sister 329  Heart disease Brother    Hyperlipidemia Brother    Cancer Paternal Grandmother        unknown   Prostate cancer Paternal Grandfather        Prostate cancer   Breast cancer Neg Hx      Current Outpatient Medications:    acetaminophen (TYLENOL) 500 MG tablet, Take 500 mg by mouth every 6 (six) hours as needed for moderate pain., Disp: , Rfl:    aspirin 81 MG tablet, Take 81 mg by mouth daily., Disp: , Rfl:    Blood Glucose Monitoring Suppl (ONE TOUCH ULTRA 2) w/Device KIT, USE TO TEST UP TO 4 TIMES PER DAY., Disp: 1 each, Rfl: 1   cholecalciferol (VITAMIN  D) 1000 UNITS tablet, Take 1,000 Units by mouth daily., Disp: , Rfl:    Dapagliflozin-metFORMIN HCl ER (XIGDUO XR) 11-998 MG TB24, Take 1 tablet by mouth 2 (two) times daily., Disp: 90 tablet, Rfl: 1   ENTRESTO 24-26 MG, TAKE 1 TABLET BY MOUTH TWICE A DAY, Disp: 180 tablet, Rfl: 2   furosemide (LASIX) 20 MG tablet, Take 20 mg by mouth as needed., Disp: , Rfl:    glucose blood (ONE TOUCH ULTRA TEST) test strip, USE TO TEST UP TO 4 TIMES A DAY., Disp: 100 each, Rfl: 11   Guaifenesin (MUCINEX MAXIMUM STRENGTH) 1200 MG TB12, Take 1 tablet (1,200 mg total) by mouth every 12 (twelve) hours as needed., Disp: 14 tablet, Rfl: 1   ivabradine (CORLANOR) 5 MG TABS tablet, Take 1 tablet (5 mg total) by mouth 2 (two)  times daily with a meal., Disp: 180 tablet, Rfl: 3   Metoprolol Succinate 25 MG CS24, Take by mouth. 50 mg in the AM and 25 mg in the PM, Disp: , Rfl:    Multiple Vitamins-Minerals (MULTIVITAMIN WITH MINERALS) tablet, Take 1 tablet by mouth daily., Disp: , Rfl:    nitroGLYCERIN (NITROSTAT) 0.4 MG SL tablet, Place 1 tablet (0.4 mg total) under the tongue every 5 (five) minutes as needed for chest pain., Disp: 25 tablet, Rfl: 1   OZEMPIC, 0.25 OR 0.5 MG/DOSE, 2 MG/3ML SOPN, INJECT 0.5 MG INTO THE SKIN EVERY 7 (SEVEN) WEEKS., Disp: 2 mL, Rfl: 3   pantoprazole (PROTONIX) 40 MG tablet, Take 1 tablet (40 mg total) by mouth daily., Disp: 90 tablet, Rfl: 1   Pitavastatin Calcium (LIVALO) 1 MG TABS, Take 1 tablet (1 mg total) by mouth daily., Disp: 90 tablet, Rfl: 1   spironolactone (ALDACTONE) 25 MG tablet, TAKE 1 TABLET BY MOUTH EVERY DAY, Disp: 90 tablet, Rfl: 3   thiamine (VITAMIN B-1) 100 MG tablet, Take 100 mg by mouth daily., Disp: , Rfl:   Review of Systems:  Constitutional: Denies fever, chills, diaphoresis, appetite change and fatigue.  HEENT: Denies photophobia, eye pain, redness, mouth sores, trouble swallowing, neck pain, neck stiffness and tinnitus.   Respiratory: Denies SOB, DOE,  chest tightness,  and wheezing.   Cardiovascular: Denies chest pain, palpitations and leg swelling.  Gastrointestinal: Denies nausea, vomiting, abdominal pain, diarrhea, constipation, blood in stool and abdominal distention.  Genitourinary: Denies dysuria, urgency, frequency, hematuria, flank pain and difficulty urinating.  Endocrine: Denies: hot or cold intolerance, sweats, changes in hair or nails, polyuria, polydipsia. Musculoskeletal: Denies myalgias, back pain, joint swelling, arthralgias and gait problem.  Skin: Denies pallor, rash and wound.  Neurological: Denies dizziness, seizures, syncope, weakness, light-headedness, numbness and headaches.  Hematological: Denies adenopathy. Easy bruising, personal or  family bleeding history  Psychiatric/Behavioral: Denies suicidal ideation, mood changes, confusion, nervousness, sleep disturbance and agitation    Physical Exam: Vitals:   07/06/22 0900  BP: 102/64  Pulse: 88  Temp: 97.6 F (36.4 C)  TempSrc: Oral  SpO2: 97%  Weight: 141 lb (64 kg)  Height: 4' 11.5" (1.511 m)    Body mass index is 28 kg/m.   Constitutional: NAD, calm, comfortable Eyes: PERRL, lids and conjunctivae normal, wears corrective lenses ENMT: Mucous membranes are moist.  Respiratory: clear to auscultation bilaterally, no wheezing, no crackles. Normal respiratory effort. No accessory muscle use.  Cardiovascular: Regular rate and rhythm, no murmurs / rubs / gallops. No extremity edema.   Psychiatric: Normal judgment and insight. Alert and oriented x 3. Normal mood.  Impression and Plan:  Non-insulin treated type 2 diabetes mellitus ([place]) - Plan: POCT glycosylated hemoglobin (Hb A1C)  Cough, unspecified type - Plan: POC COVID-19 BinaxNow, POC Influenza A/B  Elevated LDL cholesterol level  Acute combined systolic and diastolic heart failure (HCC)  -A1c of 6.1 demonstrates excellent glycemic control. -Blood pressure is well-controlled. -She had her diabetic eye exam, I will obtain records. -Her in office COVID and flu tests have resulted negative.  Advised symptomatic management for her URI symptoms.  Time spent:35 minutes reviewing chart, interviewing and examining patient and formulating plan of care.       Lelon Frohlich, MD [facility]

## 2022-07-13 ENCOUNTER — Telehealth: Payer: Self-pay | Admitting: Internal Medicine

## 2022-07-13 DIAGNOSIS — E119 Type 2 diabetes mellitus without complications: Secondary | ICD-10-CM

## 2022-07-13 MED ORDER — XIGDUO XR 5-1000 MG PO TB24: 1.0000 | ORAL_TABLET | Freq: Two times a day (BID) | ORAL | 1 refills | Status: DC

## 2022-07-13 NOTE — Telephone Encounter (Signed)
Pt requesting a refill with a 3 month supply (cheaper with insurance) of Dapagliflozin-metFORMIN HCl ER (XIGDUO XR) 11-998 MG TB24  [id number] [phone] Fax: [phone]

## 2022-07-13 NOTE — Telephone Encounter (Signed)
Refill sent.

## 2022-07-13 NOTE — Telephone Encounter (Signed)
Error- please disregard

## 2022-08-03 ENCOUNTER — Ambulatory Visit (HOSPITAL_COMMUNITY)
Admission: RE | Admit: 2022-08-03 | Discharge: 2022-08-03 | Disposition: A | Discharge status: Home / Self Care | Payer: 59 | Source: Ambulatory Visit | Attending: Cardiology | Admitting: Cardiology

## 2022-08-03 DIAGNOSIS — I5032 Chronic diastolic (congestive) heart failure: Secondary | ICD-10-CM

## 2022-08-03 LAB — BASIC METABOLIC PANEL
Anion gap: 10 (ref 5–15)
BUN: 19 mg/dL (ref 8–23)
CO2: 28 mmol/L (ref 22–32)
Calcium: 9 mg/dL (ref 8.9–10.3)
Chloride: 101 mmol/L (ref 98–111)
Creatinine, Ser: 0.93 mg/dL (ref 0.44–1.00)
GFR, Estimated: >60 mL/min (ref >60)
Glucose, Bld: 141 mg/dL — ABNORMAL HIGH (ref 70–99)
Potassium: 3.7 mmol/L (ref 3.5–5.1)
Sodium: 139 mmol/L (ref 135–145)

## 2022-08-24 ENCOUNTER — Other Ambulatory Visit: Payer: Self-pay | Admitting: Internal Medicine

## 2022-08-24 DIAGNOSIS — E7800 Pure hypercholesterolemia, unspecified: Secondary | ICD-10-CM

## 2022-08-24 DIAGNOSIS — K219 Gastro-esophageal reflux disease without esophagitis: Secondary | ICD-10-CM

## 2022-09-24 ENCOUNTER — Other Ambulatory Visit (HOSPITAL_COMMUNITY): Payer: Self-pay | Admitting: Cardiology

## 2022-10-05 ENCOUNTER — Ambulatory Visit: Payer: 59 | Admitting: Internal Medicine

## 2022-10-05 ENCOUNTER — Encounter: Payer: Self-pay | Admitting: Internal Medicine

## 2022-10-05 VITALS — BP 95/50 | HR 75 | Temp 98.3°F | Wt 140.0 lb

## 2022-10-05 DIAGNOSIS — Z8349 Family history of other endocrine, nutritional and metabolic diseases: Secondary | ICD-10-CM

## 2022-10-05 DIAGNOSIS — E119 Type 2 diabetes mellitus without complications: Secondary | ICD-10-CM | POA: Diagnosis not present

## 2022-10-05 DIAGNOSIS — I5032 Chronic diastolic (congestive) heart failure: Secondary | ICD-10-CM | POA: Diagnosis not present

## 2022-10-05 DIAGNOSIS — K219 Gastro-esophageal reflux disease without esophagitis: Secondary | ICD-10-CM

## 2022-10-05 DIAGNOSIS — E7800 Pure hypercholesterolemia, unspecified: Secondary | ICD-10-CM

## 2022-10-05 DIAGNOSIS — I428 Other cardiomyopathies: Secondary | ICD-10-CM

## 2022-10-05 LAB — POCT GLYCOSYLATED HEMOGLOBIN (HGB A1C): Hemoglobin A1C: 6.1 % — AB (ref 4.0–5.6)

## 2022-10-05 NOTE — Progress Notes (Signed)
Established Patient Office Visit     CC/Reason for Visit: 54-monthfollow-up chronic medical conditions  HPI: Emily [name] a 63y.o. female who is coming in today for the above mentioned reasons. Past Medical History is significant for: Type 2 diabetes, hyperlipidemia, chronic combined CHF with nonischemic cardiomyopathy and mitral valve regurgitation.  She is feeling well today and has no acute concerns or complaints.  Is adherent to medical therapy.   Past Medical/Surgical History: Past Medical History:  Diagnosis Date   Acute combined systolic and diastolic heart failure ([facility] 06/16/2018   Allergy    CHF (congestive heart failure) (HCC)    GERD (gastroesophageal reflux disease)    NICM (nonischemic cardiomyopathy) ([facility] 06/21/2018   NSVT (nonsustained ventricular tachycardia) ([facility] 06/13/2015   PVC's (premature ventricular contractions) 06/11/2015   S/P cardiac cath, 06/20/18 06/21/2018   Type 2 diabetes mellitus ([facility]     Past Surgical History:  Procedure Laterality Date   CARDIAC CATHETERIZATION N/A 06/19/2015   Procedure: Left Heart Cath and Coronary Angiography;  Surgeon: DLeonie Man MD;  [facility];  Service: Cardiovascular;  Laterality: N/A;   RIGHT/LEFT HEART CATH AND CORONARY ANGIOGRAPHY N/A 06/19/2018   Procedure: RIGHT/LEFT HEART CATH AND CORONARY ANGIOGRAPHY;  Surgeon: KTroy Sine MD;  [facility];  Service: Cardiovascular;  Laterality: N/A;   TEE WITHOUT CARDIOVERSION N/A 06/20/2018   Procedure: TRANSESOPHAGEAL ECHOCARDIOGRAM (TEE);  Surgeon: MLarey Dresser MD;  [facility];  Service: Cardiovascular;  Laterality: N/A;   TONSILLECTOMY     when she was 3    Social History:  reports that she has never smoked. She has never used smokeless tobacco. She reports current alcohol use. She reports that she does not use drugs.  Allergies: Allergies  Allergen Reactions   Hornet Venom Swelling    Metoprolol     depression   Tetanus Toxoids Other (See Comments)    Big red rash and swelling in the muscle    Family History:  Family History  Problem Relation Age of Onset   Heart disease Mother    COPD Mother    Emphysema Mother    Cancer Mother        oral   Heart disease Father    Diabetes Father    COPD Father    Vision loss Father    Skin cancer Father        skin   Hyperlipidemia Father    Hypertension Father    Heart disease Sister    Graves' disease Sister    Diabetes Sister    Hyperlipidemia Sister    Heart failure Sister 373  Heart disease Brother    Hyperlipidemia Brother    Cancer Paternal Grandmother        unknown   Prostate cancer Paternal Grandfather        Prostate cancer   Breast cancer Neg Hx      Current Outpatient Medications:    acetaminophen (TYLENOL) 500 MG tablet, Take 500 mg by mouth every 6 (six) hours as needed for moderate pain., Disp: , Rfl:    aspirin 81 MG tablet, Take 81 mg by mouth daily., Disp: , Rfl:    Blood Glucose Monitoring Suppl (ONE TOUCH ULTRA 2) w/Device KIT, USE TO TEST UP TO 4 TIMES PER DAY., Disp: 1 each, Rfl: 1   cholecalciferol (VITAMIN D) 1000 UNITS tablet, Take 1,000 Units by mouth daily., Disp: , Rfl:  Dapagliflozin Pro-metFORMIN ER (XIGDUO XR) 11-998 MG TB24, Take 1 tablet by mouth 2 (two) times daily., Disp: 180 tablet, Rfl: 1   ENTRESTO 24-26 MG, TAKE 1 TABLET BY MOUTH TWICE A DAY, Disp: 180 tablet, Rfl: 2   furosemide (LASIX) 20 MG tablet, Take 20 mg by mouth as needed., Disp: , Rfl:    glucose blood (ONE TOUCH ULTRA TEST) test strip, USE TO TEST UP TO 4 TIMES A DAY., Disp: 100 each, Rfl: 11   Guaifenesin (MUCINEX MAXIMUM STRENGTH) 1200 MG TB12, Take 1 tablet (1,200 mg total) by mouth every 12 (twelve) hours as needed., Disp: 14 tablet, Rfl: 1   ivabradine (CORLANOR) 5 MG TABS tablet, Take 1 tablet (5 mg total) by mouth 2 (two) times daily with a meal., Disp: 180 tablet, Rfl: 3   Metoprolol Succinate 25 MG  CS24, Take by mouth. 50 mg in the AM and 25 mg in the PM, Disp: , Rfl:    Multiple Vitamins-Minerals (MULTIVITAMIN WITH MINERALS) tablet, Take 1 tablet by mouth daily., Disp: , Rfl:    nitroGLYCERIN (NITROSTAT) 0.4 MG SL tablet, Place 1 tablet (0.4 mg total) under the tongue every 5 (five) minutes as needed for chest pain., Disp: 25 tablet, Rfl: 1   pantoprazole (PROTONIX) 40 MG tablet, TAKE 1 TABLET BY MOUTH EVERY DAY, Disp: 90 tablet, Rfl: 1   Pitavastatin Calcium 1 MG TABS, TAKE 1 TABLET BY MOUTH EVERY DAY, Disp: 90 tablet, Rfl: 1   Semaglutide,0.25 or 0.'5MG'$ /DOS, (OZEMPIC, 0.25 OR 0.5 MG/DOSE,) 2 MG/3ML SOPN, INJECT 0.5 MG INTO THE SKIN EVERY 7 (SEVEN) WEEKS., Disp: 3 mL, Rfl: 2   spironolactone (ALDACTONE) 25 MG tablet, TAKE 1 TABLET BY MOUTH EVERY DAY, Disp: 90 tablet, Rfl: 3   thiamine (VITAMIN B-1) 100 MG tablet, Take 100 mg by mouth daily., Disp: , Rfl:   Review of Systems:  Negative unless indicated in HPI.   Physical Exam: Vitals:   10/05/22 0903 10/05/22 0904  BP: (!) 108/44 (!) 95/50  Pulse: 75   Temp: 98.3 F (36.8 C)   TempSrc: Oral   SpO2: 100%   Weight: 140 lb (63.5 kg)     Body mass index is 27.8 kg/m.   Physical Exam Vitals reviewed.  Constitutional:      Appearance: Normal appearance.  HENT:     Head: Normocephalic and atraumatic.  Eyes:     Conjunctiva/sclera: Conjunctivae normal.     Pupils: Pupils are equal, round, and reactive to light.  Cardiovascular:     Rate and Rhythm: Normal rate and regular rhythm.  Pulmonary:     Effort: Pulmonary effort is normal.     Breath sounds: Normal breath sounds.  Skin:    General: Skin is warm and dry.  Neurological:     General: No focal deficit present.     Mental Status: She is alert and oriented to person, place, and time.  Psychiatric:        Mood and Affect: Mood normal.        Behavior: Behavior normal.        Thought Content: Thought content normal.        Judgment: Judgment normal.       Impression and Plan:  Non-insulin treated type 2 diabetes mellitus ([place]) - Plan: POC HgB A1c  Elevated LDL cholesterol level  Family history of thyroid disorder  Chronic diastolic heart failure (HCC)  NICM (nonischemic cardiomyopathy) (HCC)  Gastroesophageal reflux disease, unspecified whether esophagitis present  -A1c of 6.1  demonstrates excellent diabetic control. -Blood pressure is well-controlled. -She continues to follow routinely with cardiology in regards to her nonischemic cardiomyopathy and chronic combined CHF. -GERD has been well-controlled on pantoprazole.  Time spent:32 minutes reviewing chart, interviewing and examining patient and formulating plan of care.     Lelon Frohlich, MD [facility]

## 2022-11-03 ENCOUNTER — Telehealth (HOSPITAL_COMMUNITY): Payer: Self-pay | Admitting: Pharmacy Technician

## 2022-11-03 NOTE — Telephone Encounter (Signed)
Advanced Heart Failure Patient Advocate Encounter  Prior Authorization for [place] has been submitted and approved.    PA# [id number] Effective dates: 10/04/22 through 11/03/23  Charlann Boxer, CPhT

## 2022-11-04 ENCOUNTER — Other Ambulatory Visit (HOSPITAL_COMMUNITY): Payer: Self-pay | Admitting: Cardiology

## 2022-11-07 ENCOUNTER — Ambulatory Visit (HOSPITAL_COMMUNITY)
Admission: RE | Admit: 2022-11-07 | Discharge: 2022-11-07 | Disposition: A | Discharge status: Home / Self Care | Payer: 59 | Source: Ambulatory Visit | Attending: Cardiology | Admitting: Cardiology

## 2022-11-07 ENCOUNTER — Encounter (HOSPITAL_COMMUNITY): Payer: Self-pay | Admitting: Cardiology

## 2022-11-07 VITALS — BP 112/70 | HR 70 | Wt 142.6 lb

## 2022-11-07 DIAGNOSIS — Z8249 Family history of ischemic heart disease and other diseases of the circulatory system: Secondary | ICD-10-CM | POA: Insufficient documentation

## 2022-11-07 DIAGNOSIS — E785 Hyperlipidemia, unspecified: Secondary | ICD-10-CM | POA: Diagnosis not present

## 2022-11-07 DIAGNOSIS — I5022 Chronic systolic (congestive) heart failure: Secondary | ICD-10-CM | POA: Diagnosis present

## 2022-11-07 DIAGNOSIS — I5032 Chronic diastolic (congestive) heart failure: Secondary | ICD-10-CM | POA: Diagnosis not present

## 2022-11-07 DIAGNOSIS — Z8349 Family history of other endocrine, nutritional and metabolic diseases: Secondary | ICD-10-CM | POA: Diagnosis not present

## 2022-11-07 DIAGNOSIS — I340 Nonrheumatic mitral (valve) insufficiency: Secondary | ICD-10-CM | POA: Diagnosis not present

## 2022-11-07 DIAGNOSIS — E119 Type 2 diabetes mellitus without complications: Secondary | ICD-10-CM | POA: Insufficient documentation

## 2022-11-07 DIAGNOSIS — E669 Obesity, unspecified: Secondary | ICD-10-CM | POA: Insufficient documentation

## 2022-11-07 DIAGNOSIS — I428 Other cardiomyopathies: Secondary | ICD-10-CM | POA: Insufficient documentation

## 2022-11-07 DIAGNOSIS — Z833 Family history of diabetes mellitus: Secondary | ICD-10-CM | POA: Insufficient documentation

## 2022-11-07 DIAGNOSIS — Z79899 Other long term (current) drug therapy: Secondary | ICD-10-CM | POA: Diagnosis not present

## 2022-11-07 DIAGNOSIS — Z8616 Personal history of COVID-19: Secondary | ICD-10-CM | POA: Diagnosis not present

## 2022-11-07 DIAGNOSIS — Z7984 Long term (current) use of oral hypoglycemic drugs: Secondary | ICD-10-CM | POA: Insufficient documentation

## 2022-11-07 LAB — LIPID PANEL
Cholesterol: 159 mg/dL (ref 0–200)
HDL: 39 mg/dL — ABNORMAL LOW (ref >40)
LDL Cholesterol: 85 mg/dL (ref 0–99)
Total CHOL/HDL Ratio: 4.1 RATIO
Triglycerides: 177 mg/dL — ABNORMAL HIGH (ref <150)
VLDL: 35 mg/dL (ref 0–40)

## 2022-11-07 LAB — BASIC METABOLIC PANEL
Anion gap: 9 (ref 5–15)
BUN: 14 mg/dL (ref 8–23)
CO2: 26 mmol/L (ref 22–32)
Calcium: 9.5 mg/dL (ref 8.9–10.3)
Chloride: 105 mmol/L (ref 98–111)
Creatinine, Ser: 0.79 mg/dL (ref 0.44–1.00)
GFR, Estimated: >60 mL/min (ref >60)
Glucose, Bld: 106 mg/dL — ABNORMAL HIGH (ref 70–99)
Potassium: 4.4 mmol/L (ref 3.5–5.1)
Sodium: 140 mmol/L (ref 135–145)

## 2022-11-07 NOTE — Patient Instructions (Signed)
There has been no changes to your medications.  Labs done today, your results will be available in MyChart, we will contact you for abnormal readings.  Repeat blood work in 3 months  Your physician has requested that you have an echocardiogram. Echocardiography is a painless test that uses sound waves to create images of your heart. It provides your doctor with information about the size and shape of your heart and how well your heart's chambers and valves are working. This procedure takes approximately one hour. There are no restrictions for this procedure. Please do NOT wear cologne, perfume, aftershave, or lotions (deodorant is allowed). Please arrive 15 minutes prior to your appointment time.  Your physician recommends that you schedule a follow-up appointment in: 6 months (October ) ** please call the office in July to arrange your follow up appointment. **  If you have any questions or concerns before your next appointment please send [place] a message through [place] or call our office at [phone].    TO LEAVE A MESSAGE FOR THE NURSE SELECT OPTION 2, PLEASE LEAVE A MESSAGE INCLUDING: YOUR NAME DATE OF BIRTH CALL BACK NUMBER REASON FOR CALL**this is important as we prioritize the call backs  YOU WILL RECEIVE A CALL BACK THE SAME DAY AS LONG AS YOU CALL BEFORE 4:00 PM  At the [facility], you and your health needs are our priority. As part of our continuing mission to provide you with exceptional heart care, we have created designated Provider Care Teams. These Care Teams include your primary Cardiologist (physician) and Advanced Practice Providers (APPs- Physician Assistants and Nurse Practitioners) who all work together to provide you with the care you need, when you need it.   You may see any of the following providers on your designated Care Team at your next follow up: Dr Arvilla Meres Dr Marca Ancona Dr. Marcos Eke, NP Robbie Lis,  [place] [facility] [place], [place] Brynda Peon, NP Karle Plumber, PharmD   Please be sure to bring in all your medications bottles to every appointment.    Thank you for choosing  [facility]

## 2022-11-07 NOTE — Progress Notes (Signed)
Advanced Heart Failure Clinic Note   Referring Physician: PCP: Philip AspenHernandez Acosta, Limmie PatriciaEstela Y, MD HF Cardiology: Dr. Shirlee LatchMcLean  HPI:  Emily [name] is a 62 y.o. female with Chronic systolic CHF, NICM, mitral regurgitation, DM2, GERD, HDL, and Obesity.    Admitted 11/16 - 06/21/18 with acute systolic CHF. Noted by be NICM by cMRI and cath. At least moderate, functional MR noted. Diuresed with IV lasix and meds adjusted as tolerated.  Echo in 3/20 showed EF 30-35%, diffuse hypokinesis, mild MR.  Echo in 7/20 showed EF up to 40-45%.  Echo in 4/21 showed EF up to 50% with normal RV.  Echo in 4/22 showed EF 45-50%, diffuse hypokinesis, normal RV.   In 4/23 , she noted increase in baseline HR and symptomatic w/ palpitations. EKG showed sinus tach. Soft BP limited titration of metoprolol. She was started on ivabradine and Zio was placed to r/o Afib.  Zio showed predominant NSR, no Afib, rare PACs and PVCs and 2 short runs of NSVT.   Patient returns for followup of CHF.  Generally doing well.  She works in her yard and has been keeping her 63-year-old grandchild.  No significant exertional dyspnea though she coughs some at night.  No orthopnea/PND.  Occasional lightheadedness when she bends over then stands back up too fast.  Rare palpitations.  No chest pain. Weight stable.   ECG (personally reviewed):  NSR, low voltage.   Labs (1/20): K 3.9, creatinine 0.97  Labs (3/20): K 3.9, creatinine 0.95 Labs (7/20): TSH normal, hgb 14.3 Labs (8/20): LDL 100, K 4.8, creatinine 0.95 Labs (1/21): K 3.6, creatinine 0.92 Labs (7/21): K 3.9, creatinine 0.89 Labs (12/21): K 4, creatinine 0.92, LDL 72 Labs (7/22): K 4.2, creatinine 0.85 Labs (10/22): BNP 21.6, K 4, creatinine 0.93 Labs (4/23) K 3.8, SCr 0.94, BNP 19  Labs (6/23): K 3.9, creatinine 0.83, hgb 14.3, LDL 80 Labs (1/24): K 3.7, creatinine 0.93  Review of systems complete and found to be negative unless listed in HPI.    Past Medical  History 1. Chronic systolic CHF: No significant coronary disease on LHC in 11/19. RHC (11/19) with mean RA 6, PA 42/20, mean PCWP 25, CI 1.8, PVR 1.7 WU. cMRI (11/19) showed EF 28%, mildly decreased RV size, and no myocardial LGE.  - TEE 06/20/18: LVEF 25-30%, Normal RV size and mildly decreased function, Mod LAE, Small PFO, Mild TR, Trivial PI, moderate-severe MR but looked more severe visually. MR is central suggestive of functional MR.  - Echo (3/20): EF 30-35%, diffuse hypokinesis, mild MR.  - Echo (7/20): EF 40-45% - Echo (4/21): EF 50%, normal RV.  - Echo (4/22): EF 45-50%, diffuse hypokinesis, normal RV.  2. Mitral regurgitation: Moderate-severe functional MR by TEE in 11/19.  Echo (4/21) with only mild MR.  3. DM2 4. Hyperlipidemia.  5. GERD 6. Zio patch x 7 days (9/20): short runs SVT, NSVT (rare).  7. COVID-19 infection: 2/21.  8. Sleep study in 10/23 did not show OSA.   Current Outpatient Medications  Medication Sig Dispense Refill   acetaminophen (TYLENOL) 500 MG tablet Take 500 mg by mouth every 6 (six) hours as needed for moderate pain.     aspirin 81 MG tablet Take 81 mg by mouth daily.     Blood Glucose Monitoring Suppl (ONE TOUCH ULTRA 2) w/Device KIT USE TO TEST UP TO 4 TIMES PER DAY. 1 each 1   cholecalciferol (VITAMIN D) 1000 UNITS tablet Take 1,000 Units by mouth daily.  CORLANOR 5 MG TABS tablet TAKE 1 TABLET BY MOUTH TWICE A DAY WITH A MEAL 180 tablet 3   Dapagliflozin Pro-metFORMIN ER (XIGDUO XR) 11-998 MG TB24 Take 1 tablet by mouth 2 (two) times daily. 180 tablet 1   ENTRESTO 24-26 MG TAKE 1 TABLET BY MOUTH TWICE A DAY 180 tablet 2   furosemide (LASIX) 20 MG tablet Take 20 mg by mouth as needed.     glucose blood (ONE TOUCH ULTRA TEST) test strip USE TO TEST UP TO 4 TIMES A DAY. 100 each 11   Guaifenesin (MUCINEX MAXIMUM STRENGTH) 1200 MG TB12 Take 1 tablet (1,200 mg total) by mouth every 12 (twelve) hours as needed. 14 tablet 1   Metoprolol Succinate 25 MG  CS24 Take by mouth. 50 mg in the AM and 25 mg in the PM     Multiple Vitamins-Minerals (MULTIVITAMIN WITH MINERALS) tablet Take 1 tablet by mouth daily.     nitroGLYCERIN (NITROSTAT) 0.4 MG SL tablet Place 1 tablet (0.4 mg total) under the tongue every 5 (five) minutes as needed for chest pain. 25 tablet 1   pantoprazole (PROTONIX) 40 MG tablet TAKE 1 TABLET BY MOUTH EVERY DAY 90 tablet 1   Pitavastatin Calcium 1 MG TABS TAKE 1 TABLET BY MOUTH EVERY DAY 90 tablet 1   Semaglutide,0.25 or 0.5MG /DOS, (OZEMPIC, 0.25 OR 0.5 MG/DOSE,) 2 MG/3ML SOPN INJECT 0.5 MG INTO THE SKIN EVERY 7 (SEVEN) WEEKS. 3 mL 2   spironolactone (ALDACTONE) 25 MG tablet TAKE 1 TABLET BY MOUTH EVERY DAY 90 tablet 3   thiamine (VITAMIN B-1) 100 MG tablet Take 100 mg by mouth daily.     No current facility-administered medications for this encounter.    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  Education: [employer].         Social Determinants of Health   Financial Resource Strain: Patient Declined (07/05/2022)   Overall Financial Resource Strain (CARDIA)    Difficulty of Paying Living Expenses: Patient declined  Food Insecurity: No Food Insecurity (07/05/2022)   Hunger Vital Sign     Worried About Running Out of Food in the Last Year: Never true    Ran Out of Food in the Last Year: Never true  Transportation Needs: No Transportation Needs (07/05/2022)   PRAPARE - Administrator, Civil Service (Medical): No    Lack of Transportation (Non-Medical): No  Physical Activity: Unknown (07/05/2022)   Exercise Vital Sign    Days of Exercise per Week: Patient declined    Minutes of Exercise per Session: Not on file  Stress: Not on file  Social Connections: Unknown (07/05/2022)   Social Connection and Isolation Panel [NHANES]    Frequency of Communication with Friends and Family: More than three times a week    Frequency of Social Gatherings with Friends and Family: Patient declined    Attends Religious Services: Patient declined    Database administrator or Organizations: Patient declined    Attends Engineer, structural: Not on file    Marital Status: Married  Catering manager Violence: Not on file      Family History  Problem Relation Age of Onset   Heart disease Mother    COPD Mother    Emphysema Mother    Cancer Mother        oral   Heart disease Father    Diabetes Father    COPD Father    Vision loss Father    Skin cancer Father        skin   Hyperlipidemia Father    Hypertension Father    Heart disease Sister    Graves' disease Sister    Diabetes Sister    Hyperlipidemia Sister    Heart failure Sister 35   Heart disease Brother    Hyperlipidemia Brother    Cancer Paternal Grandmother        unknown   Prostate cancer Paternal Grandfather        Prostate cancer   Breast cancer Neg Hx     Vitals:   11/07/22 1454  BP: 112/70  Pulse: 70  SpO2: 99%  Weight: 64.7 kg (142 lb 9.6 oz)     Wt Readings from Last 3 Encounters:  11/07/22 64.7 kg (142 lb 9.6 oz)  10/05/22 63.5 kg (140 lb)  07/06/22 64 kg (141 lb)     PHYSICAL EXAM: General: NAD Neck: No JVD, no thyromegaly or thyroid nodule.  Lungs: Clear to auscultation  bilaterally with normal respiratory effort. CV: Nondisplaced PMI.  Heart regular S1/S2, no S3/S4, no murmur.  No peripheral edema.  No carotid bruit.  Normal pedal pulses.  Abdomen: Soft, nontender, no hepatosplenomegaly, no distention.  Skin: Intact without lesions or rashes.  Neurologic: Alert and oriented x 3.  Psych: Normal affect. Extremities: No clubbing or cyanosis.  HEENT: Normal.   ASSESSMENT & PLAN:  1. Chronic systolic CHF: Nonischemic cardiomyopathy by [facility] 06/2018. cMRI showed EF 28%, mildly decreased RV size, and no myocardial LGE. Echo in 7/20 showed EF up to 40-45% and echo in 4/21 showed EF up to 50%.  Echo in 4/22 with stable EF 45-50%. NYHA Class II symptoms (stable). Stable weight, not volume overloaded on exam.  - Continue prn use of Lasix.   - Continue Toprol XL 50 qam/25 qpm.  - Continue Entresto 24/26 mg BID. BP low at times at home, will not increase.  - Continue spironolactone 25 mg daily.  BMET today.  - Continue Farxiga 10 mg daily.  - Continue Ivabradine 5 mg bid  - I will arrange for repeat echo.  2. Hyperlipidemia: On Livalo.  - Check lipids today.  Followup 6 months with APP, will need BMET in 3 months.   Marca Ancona, 11/07/2022

## 2022-11-09 ENCOUNTER — Encounter: Payer: Self-pay | Admitting: Gastroenterology

## 2022-11-22 ENCOUNTER — Other Ambulatory Visit: Payer: Self-pay | Admitting: Internal Medicine

## 2022-11-24 ENCOUNTER — Ambulatory Visit (HOSPITAL_COMMUNITY)
Admission: RE | Admit: 2022-11-24 | Discharge: 2022-11-24 | Disposition: A | Discharge status: Home / Self Care | Payer: 59 | Source: Ambulatory Visit | Attending: Cardiology | Admitting: Cardiology

## 2022-11-24 DIAGNOSIS — I429 Cardiomyopathy, unspecified: Secondary | ICD-10-CM | POA: Insufficient documentation

## 2022-11-24 DIAGNOSIS — I340 Nonrheumatic mitral (valve) insufficiency: Secondary | ICD-10-CM | POA: Diagnosis not present

## 2022-11-24 DIAGNOSIS — E119 Type 2 diabetes mellitus without complications: Secondary | ICD-10-CM | POA: Diagnosis not present

## 2022-11-24 DIAGNOSIS — I472 Ventricular tachycardia, unspecified: Secondary | ICD-10-CM | POA: Insufficient documentation

## 2022-11-24 DIAGNOSIS — I493 Ventricular premature depolarization: Secondary | ICD-10-CM | POA: Insufficient documentation

## 2022-11-24 DIAGNOSIS — I5032 Chronic diastolic (congestive) heart failure: Secondary | ICD-10-CM | POA: Diagnosis not present

## 2022-11-24 DIAGNOSIS — K219 Gastro-esophageal reflux disease without esophagitis: Secondary | ICD-10-CM | POA: Insufficient documentation

## 2022-11-24 LAB — ECHOCARDIOGRAM COMPLETE
Area-P 1/2: 3.95 cm2
Calc EF: 51.5 %
S' Lateral: 3.7 cm
Single Plane A2C EF: 50.6 %
Single Plane A4C EF: 51.4 %

## 2022-11-24 NOTE — Progress Notes (Signed)
Echocardiogram 2D Echocardiogram has been performed.  Augustine Radar 11/24/2022, 2:40 PM

## 2022-11-25 ENCOUNTER — Encounter (HOSPITAL_COMMUNITY): Payer: Self-pay

## 2022-11-30 ENCOUNTER — Other Ambulatory Visit: Payer: Self-pay | Admitting: Internal Medicine

## 2022-12-26 ENCOUNTER — Other Ambulatory Visit (HOSPITAL_COMMUNITY): Payer: Self-pay | Admitting: Cardiology

## 2023-01-08 ENCOUNTER — Other Ambulatory Visit: Payer: Self-pay | Admitting: Internal Medicine

## 2023-01-08 DIAGNOSIS — E119 Type 2 diabetes mellitus without complications: Secondary | ICD-10-CM

## 2023-01-24 ENCOUNTER — Other Ambulatory Visit (HOSPITAL_COMMUNITY): Payer: Self-pay | Admitting: Cardiology

## 2023-01-27 ENCOUNTER — Other Ambulatory Visit (HOSPITAL_COMMUNITY): Payer: Self-pay | Admitting: Cardiology

## 2023-02-06 ENCOUNTER — Ambulatory Visit (HOSPITAL_COMMUNITY)
Admission: RE | Admit: 2023-02-06 | Discharge: 2023-02-06 | Disposition: A | Discharge status: Home / Self Care | Payer: 59 | Source: Ambulatory Visit | Attending: Internal Medicine | Admitting: Internal Medicine

## 2023-02-06 DIAGNOSIS — I5032 Chronic diastolic (congestive) heart failure: Secondary | ICD-10-CM

## 2023-02-06 LAB — BASIC METABOLIC PANEL
Anion gap: 7 (ref 5–15)
BUN: 14 mg/dL (ref 8–23)
CO2: 27 mmol/L (ref 22–32)
Calcium: 9 mg/dL (ref 8.9–10.3)
Chloride: 103 mmol/L (ref 98–111)
Creatinine, Ser: 0.91 mg/dL (ref 0.44–1.00)
GFR, Estimated: >60 mL/min (ref >60)
Glucose, Bld: 124 mg/dL — ABNORMAL HIGH (ref 70–99)
Potassium: 3.8 mmol/L (ref 3.5–5.1)
Sodium: 137 mmol/L (ref 135–145)

## 2023-02-22 ENCOUNTER — Other Ambulatory Visit: Payer: Self-pay | Admitting: Internal Medicine

## 2023-02-22 DIAGNOSIS — E7800 Pure hypercholesterolemia, unspecified: Secondary | ICD-10-CM

## 2023-02-22 DIAGNOSIS — K219 Gastro-esophageal reflux disease without esophagitis: Secondary | ICD-10-CM

## 2023-03-16 ENCOUNTER — Encounter (INDEPENDENT_AMBULATORY_CARE_PROVIDER_SITE_OTHER): Payer: Self-pay

## 2023-03-27 ENCOUNTER — Other Ambulatory Visit: Payer: Self-pay | Admitting: Internal Medicine

## 2023-04-06 ENCOUNTER — Ambulatory Visit (INDEPENDENT_AMBULATORY_CARE_PROVIDER_SITE_OTHER): Payer: 59 | Admitting: Internal Medicine

## 2023-04-06 ENCOUNTER — Encounter: Payer: Self-pay | Admitting: Internal Medicine

## 2023-04-06 VITALS — BP 110/80 | HR 70 | Temp 98.3°F | Ht 59.9 in | Wt 134.3 lb

## 2023-04-06 DIAGNOSIS — Z23 Encounter for immunization: Secondary | ICD-10-CM

## 2023-04-06 DIAGNOSIS — E119 Type 2 diabetes mellitus without complications: Secondary | ICD-10-CM | POA: Diagnosis not present

## 2023-04-06 DIAGNOSIS — Z1231 Encounter for screening mammogram for malignant neoplasm of breast: Secondary | ICD-10-CM

## 2023-04-06 DIAGNOSIS — Z8349 Family history of other endocrine, nutritional and metabolic diseases: Secondary | ICD-10-CM | POA: Diagnosis not present

## 2023-04-06 DIAGNOSIS — E7800 Pure hypercholesterolemia, unspecified: Secondary | ICD-10-CM | POA: Diagnosis not present

## 2023-04-06 DIAGNOSIS — Z0000 Encounter for general adult medical examination without abnormal findings: Secondary | ICD-10-CM

## 2023-04-06 DIAGNOSIS — Z7985 Long-term (current) use of injectable non-insulin antidiabetic drugs: Secondary | ICD-10-CM

## 2023-04-06 DIAGNOSIS — Z1211 Encounter for screening for malignant neoplasm of colon: Secondary | ICD-10-CM

## 2023-04-06 LAB — CBC WITH DIFFERENTIAL/PLATELET
Basophils Absolute: 0 10*3/uL (ref 0.0–0.1)
Basophils Relative: 0.4 % (ref 0.0–3.0)
Eosinophils Absolute: 0.2 10*3/uL (ref 0.0–0.7)
Eosinophils Relative: 2.3 % (ref 0.0–5.0)
HCT: 41.6 % (ref 36.0–46.0)
Hemoglobin: 13.8 g/dL (ref 12.0–15.0)
Lymphocytes Relative: 32.4 % (ref 12.0–46.0)
Lymphs Abs: 2.7 10*3/uL (ref 0.7–4.0)
MCHC: 33.2 g/dL (ref 30.0–36.0)
MCV: 87.6 fl (ref 78.0–100.0)
Monocytes Absolute: 0.4 10*3/uL (ref 0.1–1.0)
Monocytes Relative: 5.4 % (ref 3.0–12.0)
Neutro Abs: 4.9 10*3/uL (ref 1.4–7.7)
Neutrophils Relative %: 59.5 % (ref 43.0–77.0)
Platelets: 310 10*3/uL (ref 150.0–400.0)
RBC: 4.75 Mil/uL (ref 3.87–5.11)
RDW: 13.9 % (ref 11.5–15.5)
WBC: 8.3 10*3/uL (ref 4.0–10.5)

## 2023-04-06 LAB — LIPID PANEL
Cholesterol: 135 mg/dL (ref 0–200)
HDL: 37.4 mg/dL — ABNORMAL LOW
LDL Cholesterol: 71 mg/dL (ref 0–99)
NonHDL: 97.36
Total CHOL/HDL Ratio: 4
Triglycerides: 132 mg/dL (ref 0.0–149.0)
VLDL: 26.4 mg/dL (ref 0.0–40.0)

## 2023-04-06 LAB — COMPREHENSIVE METABOLIC PANEL WITH GFR
ALT: 15 U/L (ref 0–35)
AST: 18 U/L (ref 0–37)
Albumin: 4.1 g/dL (ref 3.5–5.2)
Alkaline Phosphatase: 51 U/L (ref 39–117)
BUN: 11 mg/dL (ref 6–23)
CO2: 28 meq/L (ref 19–32)
Calcium: 9.4 mg/dL (ref 8.4–10.5)
Chloride: 105 meq/L (ref 96–112)
Creatinine, Ser: 0.89 mg/dL (ref 0.40–1.20)
GFR: 68.88 mL/min
Glucose, Bld: 115 mg/dL — ABNORMAL HIGH (ref 70–99)
Potassium: 4.2 meq/L (ref 3.5–5.1)
Sodium: 142 meq/L (ref 135–145)
Total Bilirubin: 0.6 mg/dL (ref 0.2–1.2)
Total Protein: 7.1 g/dL (ref 6.0–8.3)

## 2023-04-06 LAB — VITAMIN D 25 HYDROXY (VIT D DEFICIENCY, FRACTURES): VITD: 49.8 ng/mL (ref 30.00–100.00)

## 2023-04-06 LAB — HEMOGLOBIN A1C: Hgb A1c MFr Bld: 6.2 % (ref 4.6–6.5)

## 2023-04-06 LAB — MICROALBUMIN / CREATININE URINE RATIO
Creatinine,U: 64.8 mg/dL
Microalb Creat Ratio: 1.1 mg/g (ref 0.0–30.0)
Microalb, Ur: <0.7 mg/dL (ref 0.0–1.9)

## 2023-04-06 LAB — VITAMIN B12: Vitamin B-12: 609 pg/mL (ref 211–911)

## 2023-04-06 LAB — TSH: TSH: 1.57 u[IU]/mL (ref 0.35–5.50)

## 2023-04-06 NOTE — Progress Notes (Signed)
Established Patient Office Visit     CC/Reason for Visit: Annual preventive exam  HPI: Emily [name] is a 62 y.o. female who is coming in today for the above mentioned reasons. 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  Has been feeling well without acute concerns or complaints.   Past Medical/Surgical History: Past Medical History:  Diagnosis Date   Acute combined systolic and diastolic heart failure (HCC) 06/16/2018   Allergy    CHF (congestive heart failure) (HCC)    GERD (gastroesophageal reflux disease)    NICM (nonischemic cardiomyopathy) (HCC) 06/21/2018   NSVT (nonsustained ventricular tachycardia) (HCC) 06/13/2015   PVC's (premature ventricular contractions) 06/11/2015   S/P cardiac cath, 06/20/18 06/21/2018   Type 2 diabetes mellitus (HCC)     Past Surgical History:  Procedure Laterality Date   CARDIAC CATHETERIZATION N/A 06/19/2015   Procedure: Left Heart Cath and Coronary Angiography;  Surgeon: Marykay Lex, MD;  Location: [facility] INVASIVE CV LAB;  Service: Cardiovascular;  Laterality: N/A;   RIGHT/LEFT HEART CATH AND CORONARY ANGIOGRAPHY N/A 06/19/2018   Procedure: RIGHT/LEFT HEART CATH AND CORONARY ANGIOGRAPHY;  Surgeon: Lennette Bihari, MD;  Location: [facility] INVASIVE CV LAB;  Service: Cardiovascular;  Laterality: N/A;   TEE WITHOUT CARDIOVERSION N/A 06/20/2018   Procedure: TRANSESOPHAGEAL ECHOCARDIOGRAM (TEE);  Surgeon: Laurey Morale, MD;  Location: [facility] ENDOSCOPY;  Service: Cardiovascular;  Laterality: N/A;   TONSILLECTOMY     when she was 3    Social History:  reports that she has never smoked. She has never used smokeless tobacco. She reports current alcohol use. She reports that she does not use drugs.  Allergies: Allergies  Allergen Reactions   Hornet Venom Swelling   Metoprolol     depression   Tetanus Toxoids  Other (See Comments)    Big red rash and swelling in the muscle    Family History:  Family History  Problem Relation Age of Onset   Heart disease Mother    COPD Mother    Emphysema Mother    Cancer Mother        oral   Heart disease Father    Diabetes Father    COPD Father    Vision loss Father    Skin cancer Father        skin   Hyperlipidemia Father    Hypertension Father    Heart disease Sister    Graves' disease Sister    Diabetes Sister    Hyperlipidemia Sister    Heart failure Sister 35   Heart disease Brother    Hyperlipidemia Brother    Cancer Paternal Grandmother        unknown   Prostate cancer Paternal Grandfather        Prostate cancer   Breast cancer Neg Hx      Current Outpatient Medications:    acetaminophen (TYLENOL) 500 MG tablet, Take 500 mg by mouth every 6 (six) hours as needed for moderate pain., Disp: , Rfl:    aspirin 81 MG tablet, Take 81 mg by mouth daily., Disp: , Rfl:    Blood Glucose Monitoring Suppl (ONE TOUCH ULTRA 2) w/Device KIT, USE TO TEST UP TO 4 TIMES PER DAY., Disp: 1 each, Rfl: 1   cholecalciferol (VITAMIN D) 1000 UNITS tablet, Take 1,000 Units by mouth daily., Disp: , Rfl:    CORLANOR 5 MG TABS tablet, TAKE 1 TABLET BY  MOUTH TWICE A DAY WITH A MEAL, Disp: 180 tablet, Rfl: 3   ENTRESTO 24-26 MG, TAKE 1 TABLET BY MOUTH TWICE A DAY, Disp: 180 tablet, Rfl: 2   furosemide (LASIX) 20 MG tablet, Take 20 mg by mouth as needed., Disp: , Rfl:    glucose blood (ONE TOUCH ULTRA TEST) test strip, USE TO TEST UP TO 4 TIMES A DAY., Disp: 100 each, Rfl: 11   Guaifenesin (MUCINEX MAXIMUM STRENGTH) 1200 MG TB12, Take 1 tablet (1,200 mg total) by mouth every 12 (twelve) hours as needed., Disp: 14 tablet, Rfl: 1   metoprolol succinate (TOPROL-XL) 25 MG 24 hr tablet, TAKE 2 TABLETS (50 MG TOTAL) BY MOUTH EVERY MORNING AND 1 TABLET (25 MG TOTAL) EVERY EVENING, Disp: 270 tablet, Rfl: 3   Multiple Vitamins-Minerals (MULTIVITAMIN WITH MINERALS) tablet, Take  1 tablet by mouth daily., Disp: , Rfl:    nitroGLYCERIN (NITROSTAT) 0.4 MG SL tablet, Place 1 tablet (0.4 mg total) under the tongue every 5 (five) minutes as needed for chest pain., Disp: 25 tablet, Rfl: 1   pantoprazole (PROTONIX) 40 MG tablet, TAKE 1 TABLET BY MOUTH EVERY DAY, Disp: 90 tablet, Rfl: 1   Pitavastatin Calcium 1 MG TABS, TAKE 1 TABLET BY MOUTH EVERY DAY, Disp: 90 tablet, Rfl: 1   Semaglutide,0.25 or 0.5MG /DOS, (OZEMPIC, 0.25 OR 0.5 MG/DOSE,) 2 MG/3ML SOPN, INJECT 0.5MG  DOSE INTO THE SKIN EVERY 7 DAYS, Disp: 3 mL, Rfl: 2   spironolactone (ALDACTONE) 25 MG tablet, TAKE 1 TABLET BY MOUTH EVERY DAY, Disp: 90 tablet, Rfl: 3   thiamine (VITAMIN B-1) 100 MG tablet, Take 100 mg by mouth daily., Disp: , Rfl:    XIGDUO XR 11-998 MG TB24, TAKE 1 TABLET BY MOUTH TWICE A DAY, Disp: 180 tablet, Rfl: 1  Review of Systems:  Negative unless indicated in HPI.   Physical Exam: Vitals:   04/06/23 0803  BP: 110/80  Pulse: 70  Temp: 98.3 F (36.8 C)  TempSrc: Oral  SpO2: 98%  Weight: 134 lb 4.8 oz (60.9 kg)  Height: 4' 11.9" (1.521 m)    Body mass index is 26.32 kg/m.   Physical Exam Vitals reviewed.  Constitutional:      General: She is not in acute distress.    Appearance: Normal appearance. She is not ill-appearing, toxic-appearing or diaphoretic.  HENT:     Head: Normocephalic.     Right Ear: Tympanic membrane, ear canal and external ear normal. There is no impacted cerumen.     Left Ear: Tympanic membrane, ear canal and external ear normal. There is no impacted cerumen.     Nose: Nose normal.     Mouth/Throat:     Mouth: Mucous membranes are moist.     Pharynx: Oropharynx is clear. No oropharyngeal exudate or posterior oropharyngeal erythema.  Eyes:     General: No scleral icterus.       Right eye: No discharge.        Left eye: No discharge.     Conjunctiva/sclera: Conjunctivae normal.     Pupils: Pupils are equal, round, and reactive to light.  Neck:     Vascular:  No carotid bruit.  Cardiovascular:     Rate and Rhythm: Normal rate and regular rhythm.     Pulses: Normal pulses.     Heart sounds: Normal heart sounds.  Pulmonary:     Effort: Pulmonary effort is normal. No respiratory distress.     Breath sounds: Normal breath sounds.  Abdominal:  General: Abdomen is flat. Bowel sounds are normal.     Palpations: Abdomen is soft.  Musculoskeletal:        General: Normal range of motion.     Cervical back: Normal range of motion.  Skin:    General: Skin is warm and dry.  Neurological:     General: No focal deficit present.     Mental Status: She is alert and oriented to person, place, and time. Mental status is at baseline.  Psychiatric:        Mood and Affect: Mood normal.        Behavior: Behavior normal.        Thought Content: Thought content normal.        Judgment: Judgment normal.     Flowsheet Row Office Visit from 04/06/2023 in [facility] [place]  PHQ-9 Total Score 1        Impression and Plan:  Encounter for preventive health examination  Non-insulin treated type 2 diabetes mellitus (HCC) -     CBC with Differential/Platelet; Future -     Comprehensive metabolic panel; Future -     Vitamin B12; Future -     VITAMIN D 25 Hydroxy (Vit-D Deficiency, Fractures); Future -     Hemoglobin A1c; Future -     Microalbumin / creatinine urine ratio; Future  Elevated LDL cholesterol level -     Lipid panel; Future  Family history of thyroid disorder -     TSH; Future  Encounter for screening mammogram for malignant neoplasm of breast  Screening for colon cancer -     Ambulatory referral to Gastroenterology   -Recommend routine eye and dental care. -Healthy lifestyle discussed in detail. -Labs to be updated today. -Prostate cancer screening: N/A Health Maintenance  Topic Date Due   Colon Cancer Screening  10/25/2020   Yearly kidney health urinalysis for diabetes  01/06/2023   Complete foot exam    01/06/2023   Flu Shot  03/02/2023   COVID-19 Vaccine (3 - 2023-24 season) 04/22/2023*   Hemoglobin A1C  04/07/2023   Eye exam for diabetics  04/22/2023   Mammogram  11/19/2023   Yearly kidney function blood test for diabetes  02/06/2024   Pap Smear  10/27/2024   Hepatitis C Screening  Completed   HIV Screening  Completed   Zoster (Shingles) Vaccine  Completed   HPV Vaccine  Aged Out   DTaP/Tdap/Td vaccine  Discontinued  *Topic was postponed. The date shown is not the original due date.     -Flu vaccine in office today. -Will obtain COVID-vaccine at a later date. -Send back to GI as she is overdue for her 5-year repeat colonoscopy.     Chaya Jan, MD [facility]

## 2023-05-04 ENCOUNTER — Other Ambulatory Visit: Payer: Self-pay | Admitting: Internal Medicine

## 2023-05-11 ENCOUNTER — Other Ambulatory Visit (HOSPITAL_COMMUNITY): Payer: Self-pay | Admitting: Cardiology

## 2023-06-25 ENCOUNTER — Other Ambulatory Visit: Payer: Self-pay | Admitting: Internal Medicine

## 2023-07-12 ENCOUNTER — Encounter (HOSPITAL_COMMUNITY): Payer: Self-pay | Admitting: Cardiology

## 2023-07-12 ENCOUNTER — Ambulatory Visit (HOSPITAL_COMMUNITY)
Admission: RE | Admit: 2023-07-12 | Discharge: 2023-07-12 | Disposition: A | Discharge status: Home / Self Care | Payer: 59 | Source: Ambulatory Visit | Attending: Cardiology | Admitting: Cardiology

## 2023-07-12 VITALS — BP 124/84 | HR 73 | Wt 134.6 lb

## 2023-07-12 DIAGNOSIS — I5032 Chronic diastolic (congestive) heart failure: Secondary | ICD-10-CM

## 2023-07-12 DIAGNOSIS — E119 Type 2 diabetes mellitus without complications: Secondary | ICD-10-CM | POA: Insufficient documentation

## 2023-07-12 DIAGNOSIS — I428 Other cardiomyopathies: Secondary | ICD-10-CM | POA: Diagnosis present

## 2023-07-12 DIAGNOSIS — Z7984 Long term (current) use of oral hypoglycemic drugs: Secondary | ICD-10-CM | POA: Insufficient documentation

## 2023-07-12 DIAGNOSIS — E669 Obesity, unspecified: Secondary | ICD-10-CM | POA: Insufficient documentation

## 2023-07-12 DIAGNOSIS — I5022 Chronic systolic (congestive) heart failure: Secondary | ICD-10-CM | POA: Diagnosis not present

## 2023-07-12 DIAGNOSIS — I340 Nonrheumatic mitral (valve) insufficiency: Secondary | ICD-10-CM | POA: Diagnosis present

## 2023-07-12 DIAGNOSIS — K219 Gastro-esophageal reflux disease without esophagitis: Secondary | ICD-10-CM | POA: Insufficient documentation

## 2023-07-12 DIAGNOSIS — R9431 Abnormal electrocardiogram [ECG] [EKG]: Secondary | ICD-10-CM | POA: Diagnosis not present

## 2023-07-12 LAB — BASIC METABOLIC PANEL
Anion gap: 10 (ref 5–15)
BUN: 17 mg/dL (ref 8–23)
CO2: 24 mmol/L (ref 22–32)
Calcium: 9.8 mg/dL (ref 8.9–10.3)
Chloride: 104 mmol/L (ref 98–111)
Creatinine, Ser: 0.82 mg/dL (ref 0.44–1.00)
GFR, Estimated: >60 mL/min (ref >60)
Glucose, Bld: 109 mg/dL — ABNORMAL HIGH (ref 70–99)
Potassium: 3.9 mmol/L (ref 3.5–5.1)
Sodium: 138 mmol/L (ref 135–145)

## 2023-07-12 LAB — BRAIN NATRIURETIC PEPTIDE: B Natriuretic Peptide: 20.3 pg/mL (ref 0.0–100.0)

## 2023-07-12 NOTE — Patient Instructions (Addendum)
There has been no changes to your medications.  Labs done today, your results will be available in MyChart, we will contact you for abnormal readings.  Your physician recommends that you schedule a follow-up appointment in: 6 months ( June 2025)   If you have any questions or concerns before your next appointment please send [place] a message through [place] or call our office at [phone].    TO LEAVE A MESSAGE FOR THE NURSE SELECT OPTION 2, PLEASE LEAVE A MESSAGE INCLUDING: YOUR NAME DATE OF BIRTH CALL BACK NUMBER REASON FOR CALL**this is important as we prioritize the call backs  YOU WILL RECEIVE A CALL BACK THE SAME DAY AS LONG AS YOU CALL BEFORE 4:00 PM  At the [facility], you and your health needs are our priority. As part of our continuing mission to provide you with exceptional heart care, we have created designated Provider Care Teams. These Care Teams include your primary Cardiologist (physician) and Advanced Practice Providers (APPs- Physician Assistants and Nurse Practitioners) who all work together to provide you with the care you need, when you need it.   You may see any of the following providers on your designated Care Team at your next follow up: Dr Arvilla Meres Dr Marca Ancona Dr. Dorthula Nettles Dr. Clearnce Hasten Amy Filbert Schilder, NP Robbie Lis, [place] [facility] [place], [place] Brynda Peon, NP [place] Lee, NP Karle Plumber, PharmD   Please be sure to bring in all your medications bottles to every appointment.    Thank you for choosing  [facility]

## 2023-07-13 NOTE — Progress Notes (Signed)
[facility] Note   Referring Physician: PCP: Philip Aspen, Limmie Patricia, MD HF Cardiology: Dr. Shirlee Latch  HPI:  Emily [name] Emily [name] is a 62 y.o. female with Chronic systolic CHF, NICM, mitral regurgitation, DM2, GERD, HDL, and Obesity.    Admitted 11/16 - 06/21/18 with acute systolic CHF. Noted by be NICM by cMRI and cath. At least moderate, functional MR noted. Diuresed with IV lasix and meds adjusted as tolerated.  Echo in 3/20 showed EF 30-35%, diffuse hypokinesis, mild MR.  Echo in 7/20 showed EF up to 40-45%.  Echo in 4/21 showed EF up to 50% with normal RV.  Echo in 4/22 showed EF 45-50%, diffuse hypokinesis, normal RV.   In 4/23 , she noted increase in baseline HR and symptomatic w/ palpitations. EKG showed sinus tach. Soft BP limited titration of metoprolol. She was started on ivabradine and Zio was placed to r/o Afib.  Zio showed predominant NSR, no Afib, rare PACs and PVCs and 2 short runs of NSVT.   Echo in 4/24 showed EF 50-55%, normal RV, mild MR.   Patient returns for followup of CHF.  Weight down 8 lbs, now on semaglutide.  Generally doing well.  Has chronic occasional atypical chest pain, probably chest wall musculoskeletal pain.  Occasional lightheadedness if she stands too fast.  No falls.  No significant exertional dyspnea.  No orthopnea/PND.    ECG (personally reviewed):  NSR, nonspecific T wave flattening, PVC   Labs (1/20): K 3.9, creatinine 0.97  Labs (3/20): K 3.9, creatinine 0.95 Labs (7/20): TSH normal, hgb 14.3 Labs (8/20): LDL 100, K 4.8, creatinine 0.95 Labs (1/21): K 3.6, creatinine 0.92 Labs (7/21): K 3.9, creatinine 0.89 Labs (12/21): K 4, creatinine 0.92, LDL 72 Labs (7/22): K 4.2, creatinine 0.85 Labs (10/22): BNP 21.6, K 4, creatinine 0.93 Labs (4/23) K 3.8, SCr 0.94, BNP 19  Labs (6/23): K 3.9, creatinine 0.83, hgb 14.3, LDL 80 Labs (1/24): K 3.7, creatinine 0.93 Labs (9/24): LDL 71, K 4.2, creatinine 0.98  Review of  systems complete and found to be negative unless listed in HPI.    Past Medical History 1. Chronic systolic CHF: No significant coronary disease on LHC in 11/19. RHC (11/19) with mean RA 6, PA 42/20, mean PCWP 25, CI 1.8, PVR 1.7 WU. cMRI (11/19) showed EF 28%, mildly decreased RV size, and no myocardial LGE.  - TEE 06/20/18: LVEF 25-30%, Normal RV size and mildly decreased function, Mod LAE, Small PFO, Mild TR, Trivial PI, moderate-severe MR but looked more severe visually. MR is central suggestive of functional MR.  - Echo (3/20): EF 30-35%, diffuse hypokinesis, mild MR.  - Echo (7/20): EF 40-45% - Echo (4/21): EF 50%, normal RV.  - Echo (4/22): EF 45-50%, diffuse hypokinesis, normal RV.  - Echo (4/24): EF 50-55%, normal RV, mild MR.  2. Mitral regurgitation: Moderate-severe functional MR by TEE in 11/19.  Echo (4/21) with only mild MR.  3. DM2 4. Hyperlipidemia.  5. GERD 6. Zio patch x 7 days (9/20): short runs SVT, NSVT (rare).  7. COVID-19 infection: 2/21.  8. Sleep study in 10/23 did not show OSA.   Current Outpatient Medications  Medication Sig Dispense Refill   acetaminophen (TYLENOL) 500 MG tablet Take 500 mg by mouth every 6 (six) hours as needed for moderate pain.     aspirin 81 MG tablet Take 81 mg by mouth daily.     Blood Glucose Monitoring Suppl (ONE TOUCH ULTRA 2) w/Device KIT USE TO  TEST UP TO 4 TIMES PER DAY. 1 each 1   cholecalciferol (VITAMIN D) 1000 UNITS tablet Take 1,000 Units by mouth daily.     CORLANOR 5 MG TABS tablet TAKE 1 TABLET BY MOUTH TWICE A DAY WITH A MEAL 180 tablet 3   ENTRESTO 24-26 MG TAKE 1 TABLET BY MOUTH TWICE A DAY 180 tablet 2   furosemide (LASIX) 20 MG tablet Take 20 mg by mouth as needed.     Guaifenesin (MUCINEX MAXIMUM STRENGTH) 1200 MG TB12 Take 1 tablet (1,200 mg total) by mouth every 12 (twelve) hours as needed. 14 tablet 1   metoprolol succinate (TOPROL-XL) 25 MG 24 hr tablet TAKE 2 TABLETS (50 MG TOTAL) BY MOUTH EVERY MORNING AND 1  TABLET (25 MG TOTAL) EVERY EVENING 270 tablet 3   Multiple Vitamins-Minerals (MULTIVITAMIN WITH MINERALS) tablet Take 1 tablet by mouth daily.     nitroGLYCERIN (NITROSTAT) 0.4 MG SL tablet Place 1 tablet (0.4 mg total) under the tongue every 5 (five) minutes as needed for chest pain. 25 tablet 1   ONETOUCH ULTRA test strip USE TO TEST UP TO 4 TIMES A DAY. 100 strip 11   pantoprazole (PROTONIX) 40 MG tablet TAKE 1 TABLET BY MOUTH EVERY DAY 90 tablet 1   Pitavastatin Calcium 1 MG TABS TAKE 1 TABLET BY MOUTH EVERY DAY 90 tablet 1   Semaglutide,0.25 or 0.5MG /DOS, (OZEMPIC, 0.25 OR 0.5 MG/DOSE,) 2 MG/3ML SOPN INJECT 0.5MG  DOSE INTO THE SKIN EVERY 7 DAYS 3 mL 2   spironolactone (ALDACTONE) 25 MG tablet TAKE 1 TABLET BY MOUTH EVERY DAY 90 tablet 3   thiamine (VITAMIN B-1) 100 MG tablet Take 100 mg by mouth daily.     XIGDUO XR 11-998 MG TB24 TAKE 1 TABLET BY MOUTH TWICE A DAY 180 tablet 1   No current facility-administered medications for this encounter.    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  Education: [employer].         Social Drivers of Health   Financial Resource Strain: Patient Declined (07/05/2022)   Overall Financial Resource Strain  (CARDIA)    Difficulty of Paying Living Expenses: Patient declined  Food Insecurity: Unknown (07/05/2022)   Hunger Vital Sign    Worried About Running Out of Food in the Last Year: Not on file    Ran Out of Food in the Last Year: Never true  Transportation Needs: No Transportation Needs (07/05/2022)   PRAPARE - Administrator, Civil Service (Medical): No    Lack of Transportation (Non-Medical): No  Physical Activity: Unknown (07/05/2022)  Exercise Vital Sign    Days of Exercise per Week: Patient declined    Minutes of Exercise per Session: Not on file  Stress: Not on file  Social Connections: Unknown (07/05/2022)   Social Connection and Isolation Panel [NHANES]    Frequency of Communication with Friends and Family: More than three times a week    Frequency of Social Gatherings with Friends and Family: Patient declined    Attends Religious Services: Patient declined    Database administrator or Organizations: Patient declined    Attends Engineer, structural: Not on file    Marital Status: Married  Catering manager Violence: Not on file      Family History  Problem Relation Age of Onset   Heart disease Mother    COPD Mother    Emphysema Mother    Cancer Mother        oral   Heart disease Father    Diabetes Father    COPD Father    Vision loss Father    Skin cancer Father        skin   Hyperlipidemia Father    Hypertension Father    Heart disease Sister    Graves' disease Sister    Diabetes Sister    Hyperlipidemia Sister    Heart failure Sister 35   Heart disease Brother    Hyperlipidemia Brother    Cancer Paternal Grandmother        unknown   Prostate cancer Paternal Grandfather        Prostate cancer   Breast cancer Neg Hx     Vitals:   07/12/23 1205  BP: 124/84  Pulse: 73  SpO2: 97%  Weight: 61.1 kg (134 lb 9.6 oz)     Wt Readings from Last 3 Encounters:  07/12/23 61.1 kg (134 lb 9.6 oz)  04/06/23 60.9 kg (134 lb 4.8 oz)  11/07/22  64.7 kg (142 lb 9.6 oz)     PHYSICAL EXAM: General: NAD Neck: No JVD, no thyromegaly or thyroid nodule.  Lungs: Clear to auscultation bilaterally with normal respiratory effort. CV: Nondisplaced PMI.  Heart regular S1/S2, no S3/S4, no murmur.  No peripheral edema.  No carotid bruit.  Normal pedal pulses.  Abdomen: Soft, nontender, no hepatosplenomegaly, no distention.  Skin: Intact without lesions or rashes.  Neurologic: Alert and oriented x 3.  Psych: Normal affect. Extremities: No clubbing or cyanosis.  HEENT: Normal.   ASSESSMENT & PLAN:  1. Chronic systolic CHF: Nonischemic cardiomyopathy by [facility] 06/2018. cMRI showed EF 28%, mildly decreased RV size, and no myocardial LGE. Echo in 7/20 showed EF up to 40-45% and echo in 4/21 showed EF up to 50%.  Echo in 4/22 with stable EF 45-50%. Echo in 4/24 with EF higher at 50-55%.  NYHA class I-II, weight down, not volume overloaded on exam.  - Continue prn use of Lasix.   - Continue Toprol XL 50 qam/25 qpm.  - Continue Entresto 24/26 mg BID.  - Continue spironolactone 25 mg daily.  BMET/BNP today.  - Continue Farxiga 10 mg daily.  - Continue Ivabradine 5 mg bid  2. Hyperlipidemia: On Livalo. Lipids ok in 9/24.   Followup 6 months with APP.   Marca Ancona, 07/13/2023

## 2023-10-01 ENCOUNTER — Other Ambulatory Visit: Payer: Self-pay | Admitting: Internal Medicine

## 2023-10-01 ENCOUNTER — Other Ambulatory Visit (HOSPITAL_COMMUNITY): Payer: Self-pay | Admitting: Cardiology

## 2023-10-01 DIAGNOSIS — E7800 Pure hypercholesterolemia, unspecified: Secondary | ICD-10-CM

## 2023-10-01 DIAGNOSIS — K219 Gastro-esophageal reflux disease without esophagitis: Secondary | ICD-10-CM

## 2023-10-02 ENCOUNTER — Telehealth (HOSPITAL_COMMUNITY): Payer: Self-pay

## 2023-10-02 MED ORDER — IVABRADINE HCL 5 MG PO TABS: 5.0000 mg | ORAL_TABLET | Freq: Two times a day (BID) | ORAL | 2 refills | Status: DC

## 2023-10-02 NOTE — Telephone Encounter (Signed)
 Patient called in requesting refill on Corlanor 5mg  twice daily.;    Per last OV note Dr. Shirlee Latch - Continue Ivabradine 5 mg bid   Advised patient this has been sent to patient preferred pharmacy. Patient aware and verbalized understanding.

## 2023-10-04 ENCOUNTER — Ambulatory Visit: Payer: 59 | Admitting: Internal Medicine

## 2023-10-04 ENCOUNTER — Encounter: Payer: Self-pay | Admitting: Internal Medicine

## 2023-10-04 VITALS — BP 102/68 | HR 85 | Temp 98.3°F | Wt 137.9 lb

## 2023-10-04 DIAGNOSIS — E7800 Pure hypercholesterolemia, unspecified: Secondary | ICD-10-CM

## 2023-10-04 DIAGNOSIS — I5041 Acute combined systolic (congestive) and diastolic (congestive) heart failure: Secondary | ICD-10-CM

## 2023-10-04 DIAGNOSIS — I428 Other cardiomyopathies: Secondary | ICD-10-CM | POA: Diagnosis not present

## 2023-10-04 DIAGNOSIS — E119 Type 2 diabetes mellitus without complications: Secondary | ICD-10-CM | POA: Diagnosis not present

## 2023-10-04 LAB — POCT GLYCOSYLATED HEMOGLOBIN (HGB A1C): Hemoglobin A1C: 6.1 % — AB (ref 4.0–5.6)

## 2023-10-04 NOTE — Progress Notes (Signed)
 Established Patient Office Visit     CC/Reason for Visit: 63-month follow-up chronic medical conditions  HPI: Emily [name] is a 63 y.o. female who is coming in today for the above mentioned reasons. Past Medical History is significant for: Type 2 diabetes, hyperlipidemia, nonischemic cardiomyopathy with combined CHF, mitral valve regurgitation.  She is feeling well without concerns or complaints.   Past Medical/Surgical History: Past Medical History:  Diagnosis Date   Acute combined systolic and diastolic heart failure (HCC) 06/16/2018   Allergy    CHF (congestive heart failure) (HCC)    GERD (gastroesophageal reflux disease)    NICM (nonischemic cardiomyopathy) (HCC) 06/21/2018   NSVT (nonsustained ventricular tachycardia) (HCC) 06/13/2015   PVC's (premature ventricular contractions) 06/11/2015   S/P cardiac cath, 06/20/18 06/21/2018   Type 2 diabetes mellitus (HCC)     Past Surgical History:  Procedure Laterality Date   CARDIAC CATHETERIZATION N/A 06/19/2015   Procedure: Left Heart Cath and Coronary Angiography;  Surgeon: Marykay Lex, MD;  Location: [facility] INVASIVE CV LAB;  Service: Cardiovascular;  Laterality: N/A;   RIGHT/LEFT HEART CATH AND CORONARY ANGIOGRAPHY N/A 06/19/2018   Procedure: RIGHT/LEFT HEART CATH AND CORONARY ANGIOGRAPHY;  Surgeon: Lennette Bihari, MD;  Location: [facility] INVASIVE CV LAB;  Service: Cardiovascular;  Laterality: N/A;   TEE WITHOUT CARDIOVERSION N/A 06/20/2018   Procedure: TRANSESOPHAGEAL ECHOCARDIOGRAM (TEE);  Surgeon: Laurey Morale, MD;  Location: [facility] ENDOSCOPY;  Service: Cardiovascular;  Laterality: N/A;   TONSILLECTOMY     when she was 3    Social History:  reports that she has never smoked. She has never used smokeless tobacco. She reports current alcohol use. She reports that she does not use drugs.  Allergies: Allergies  Allergen Reactions   Hornet Venom Swelling   Metoprolol     depression   Tetanus Toxoids Other (See  Comments)    Big red rash and swelling in the muscle    Family History:  Family History  Problem Relation Age of Onset   Heart disease Mother    COPD Mother    Emphysema Mother    Cancer Mother        oral   Heart disease Father    Diabetes Father    COPD Father    Vision loss Father    Skin cancer Father        skin   Hyperlipidemia Father    Hypertension Father    Heart disease Sister    Graves' disease Sister    Diabetes Sister    Hyperlipidemia Sister    Heart failure Sister 35   Heart disease Brother    Hyperlipidemia Brother    Cancer Paternal Grandmother        unknown   Prostate cancer Paternal Grandfather        Prostate cancer   Breast cancer Neg Hx      Current Outpatient Medications:    acetaminophen (TYLENOL) 500 MG tablet, Take 500 mg by mouth every 6 (six) hours as needed for moderate pain., Disp: , Rfl:    aspirin 81 MG tablet, Take 81 mg by mouth daily., Disp: , Rfl:    Blood Glucose Monitoring Suppl (ONE TOUCH ULTRA 2) w/Device KIT, USE TO TEST UP TO 4 TIMES PER DAY., Disp: 1 each, Rfl: 1   cholecalciferol (VITAMIN D) 1000 UNITS tablet, Take 1,000 Units by mouth daily., Disp: , Rfl:    ENTRESTO 24-26 MG, TAKE 1 TABLET BY MOUTH TWICE A  DAY, Disp: 180 tablet, Rfl: 2   furosemide (LASIX) 20 MG tablet, Take 20 mg by mouth as needed., Disp: , Rfl:    Guaifenesin (MUCINEX MAXIMUM STRENGTH) 1200 MG TB12, Take 1 tablet (1,200 mg total) by mouth every 12 (twelve) hours as needed., Disp: 14 tablet, Rfl: 1   ivabradine (CORLANOR) 5 MG TABS tablet, Take 1 tablet (5 mg total) by mouth 2 (two) times daily., Disp: 180 tablet, Rfl: 2   metoprolol succinate (TOPROL-XL) 25 MG 24 hr tablet, TAKE 2 TABLETS (50 MG TOTAL) BY MOUTH EVERY MORNING AND 1 TABLET (25 MG TOTAL) EVERY EVENING, Disp: 270 tablet, Rfl: 3   Multiple Vitamins-Minerals (MULTIVITAMIN WITH MINERALS) tablet, Take 1 tablet by mouth daily., Disp: , Rfl:    nitroGLYCERIN (NITROSTAT) 0.4 MG SL tablet, Place 1  tablet (0.4 mg total) under the tongue every 5 (five) minutes as needed for chest pain., Disp: 25 tablet, Rfl: 1   ONETOUCH ULTRA test strip, USE TO TEST UP TO 4 TIMES A DAY., Disp: 100 strip, Rfl: 11   pantoprazole (PROTONIX) 40 MG tablet, TAKE 1 TABLET BY MOUTH EVERY DAY, Disp: 90 tablet, Rfl: 1   Pitavastatin Calcium 1 MG TABS, TAKE 1 TABLET BY MOUTH EVERY DAY, Disp: 90 tablet, Rfl: 1   Semaglutide,0.25 or 0.5MG /DOS, (OZEMPIC, 0.25 OR 0.5 MG/DOSE,) 2 MG/3ML SOPN, INJECT 0.5MG  DOSE INTO THE SKIN EVERY 7 DAYS, Disp: 2 mL, Rfl: 2   spironolactone (ALDACTONE) 25 MG tablet, TAKE 1 TABLET BY MOUTH EVERY DAY, Disp: 90 tablet, Rfl: 3   thiamine (VITAMIN B-1) 100 MG tablet, Take 100 mg by mouth daily., Disp: , Rfl:    XIGDUO XR 11-998 MG TB24, TAKE 1 TABLET BY MOUTH TWICE A DAY, Disp: 180 tablet, Rfl: 1  Review of Systems:  Negative unless indicated in HPI.   Physical Exam: Vitals:   10/04/23 0758  BP: 102/68  Pulse: 85  Temp: 98.3 F (36.8 C)  TempSrc: Oral  SpO2: 97%  Weight: 137 lb 14.4 oz (62.6 kg)    Body mass index is 27.02 kg/m.   Physical Exam Vitals reviewed.  Constitutional:      Appearance: Normal appearance.  HENT:     Head: Normocephalic and atraumatic.  Eyes:     Conjunctiva/sclera: Conjunctivae normal.  Cardiovascular:     Rate and Rhythm: Normal rate and regular rhythm.  Pulmonary:     Effort: Pulmonary effort is normal.     Breath sounds: Normal breath sounds.  Skin:    General: Skin is warm and dry.  Neurological:     General: No focal deficit present.     Mental Status: She is alert and oriented to person, place, and time.  Psychiatric:        Mood and Affect: Mood normal.        Behavior: Behavior normal.        Thought Content: Thought content normal.        Judgment: Judgment normal.      Impression and Plan:  Non-insulin treated type 2 diabetes mellitus (HCC) -     POCT glycosylated hemoglobin (Hb A1C)  NICM (nonischemic cardiomyopathy)  (HCC)  Acute combined systolic and diastolic heart failure (HCC)  Elevated LDL cholesterol level  -A1c of 6.1 demonstrates excellent diabetic management. -Blood pressure is well-controlled. -Cholesterol is at goal as of last check. -Her nonischemic cardiomyopathy is stable, she follows routinely with cardiology.   Time spent:32 minutes reviewing chart, interviewing and examining patient and formulating plan of  care.     Chaya Jan, MD [facility]

## 2023-10-08 ENCOUNTER — Other Ambulatory Visit: Payer: Self-pay | Admitting: Internal Medicine

## 2023-10-08 DIAGNOSIS — E119 Type 2 diabetes mellitus without complications: Secondary | ICD-10-CM

## 2023-12-25 NOTE — Telephone Encounter (Signed)
 I think you Rx this

## 2023-12-31 ENCOUNTER — Other Ambulatory Visit: Payer: Self-pay | Admitting: Internal Medicine

## 2023-12-31 ENCOUNTER — Other Ambulatory Visit (HOSPITAL_COMMUNITY): Payer: Self-pay | Admitting: Internal Medicine

## 2024-01-01 ENCOUNTER — Telehealth (HOSPITAL_COMMUNITY): Payer: Self-pay | Admitting: Pharmacy Technician

## 2024-01-01 ENCOUNTER — Other Ambulatory Visit (HOSPITAL_COMMUNITY): Payer: Self-pay

## 2024-01-01 ENCOUNTER — Telehealth: Payer: Self-pay

## 2024-01-01 NOTE — Telephone Encounter (Signed)
 Patient Advocate Encounter   Received notification from [employer] that prior authorization for Ivabradine  is required.   PA submitted on CoverMyMeds Key [id number] Status is pending   Will continue to follow.

## 2024-01-01 NOTE — Telephone Encounter (Signed)
 Pharmacy Patient Advocate Encounter   Received notification from CoverMyMeds that prior authorization for Ozempic  2 is required/requested.   Insurance verification completed.   The patient is insured through [facility] .   Per test claim: PA required; PA submitted to above mentioned insurance via CoverMyMeds Key/confirmation #/EOC [facility] Status is pending

## 2024-01-02 ENCOUNTER — Other Ambulatory Visit (HOSPITAL_COMMUNITY): Payer: Self-pay

## 2024-01-02 NOTE — Telephone Encounter (Signed)
 Advanced Heart Failure Patient Advocate Encounter  Prior Authorization for Ivabradine  has been approved.    PA# [id number] Effective dates: 01/02/24 through 07/03/24  Patients co-pay is $45 (90 days)  Called and spoke with the patient.  Correne Dillon, CPhT

## 2024-01-02 NOTE — Telephone Encounter (Signed)
 Advanced Heart Failure Patient Advocate Encounter  Patient called looking for status update. PA is still pending. We also do not have samples at this time. Will give her update once determination is received.

## 2024-01-02 NOTE — Telephone Encounter (Signed)
 Pharmacy Patient Advocate Encounter  Received notification from [facility] that Prior Authorization for Ozempic  2 has been DENIED.  No reason given; No denial letter received via Fax or CMM. It has been requested and will be uploaded to the media tab once received.   PA #/Case ID/Reference #: [id number]

## 2024-01-03 ENCOUNTER — Other Ambulatory Visit (HOSPITAL_BASED_OUTPATIENT_CLINIC_OR_DEPARTMENT_OTHER): Payer: Self-pay

## 2024-01-03 ENCOUNTER — Telehealth (HOSPITAL_COMMUNITY): Payer: Self-pay

## 2024-01-03 NOTE — Telephone Encounter (Signed)
 Called to confirm/remind patient of their appointment at the [facility] on 01/04/24.   Appointment:   [x] Confirmed  [] Left mess   [] No answer/No voice mail  [] VM Full/unable to leave message  [] Phone not in service  Patient reminded to bring all medications and/or complete list.  Confirmed patient has transportation. Gave directions, instructed to utilize valet parking.

## 2024-01-03 NOTE — Progress Notes (Signed)
 Advanced [facility] Failure Clinic Note   Referring Physician: PCP: Emily [name], Emily Coppersmith, MD HF Cardiology: Dr. Mitzie Anda  HPI:  Emily [name] is a 63 y.o. female with Chronic systolic CHF, NICM, mitral regurgitation, DM2, GERD, HDL, and Obesity.    Admitted 11/16 - 06/21/18 with acute systolic CHF. Noted by be NICM by cMRI and cath. At least moderate, functional MR noted. Diuresed with IV lasix  and meds adjusted as tolerated.  Echo in 3/20 showed EF 30-35%, diffuse hypokinesis, mild MR.  Echo in 7/20 showed EF up to 40-45%.  Echo in 4/21 showed EF up to 50% with normal RV.  Echo in 4/22 showed EF 45-50%, diffuse hypokinesis, normal RV.   In 4/23 , she noted increase in baseline HR and symptomatic w/ palpitations. EKG showed sinus tach. Soft BP limited titration of metoprolol . She was started on ivabradine  and Zio was placed to r/o Afib.  Zio showed predominant NSR, no Afib, rare PACs and PVCs and 2 short runs of NSVT.   Echo in 4/24 showed EF 50-55%, normal RV, mild MR.   Today she returns for AHF follow up. Overall feeling good just complaining of increased fatigue. Denies palpitations, CP, dizziness, edema, or PND/Orthopnea. SOB with exertion. Appetite ok. No fever or chills. Weight at home 134-138 pounds. Taking all medications. Denies ETOH, tobacco or drug use. Has not taken PRN lasix  this week. SBP at home 85-110. Reports HR occasionally drops to 40s on her apple watch while she's sitting and resting but it goes back to 80s once she starts moving around.   ECG (personally reviewed from 12/24):  NSR, nonspecific T wave flattening, PVC   Review of systems complete and found to be negative unless listed in HPI.    Past Medical History 1. Chronic systolic CHF: No significant coronary disease on LHC in 11/19. RHC (11/19) with mean RA 6, PA 42/20, mean PCWP 25, CI 1.8, PVR 1.7 WU. cMRI (11/19) showed EF 28%, mildly decreased RV size, and no myocardial LGE.  - TEE 06/20/18: LVEF  25-30%, Normal RV size and mildly decreased function, Mod LAE, Small PFO, Mild TR, Trivial PI, moderate-severe MR but looked more severe visually. MR is central suggestive of functional MR.  - Echo (3/20): EF 30-35%, diffuse hypokinesis, mild MR.  - Echo (7/20): EF 40-45% - Echo (4/21): EF 50%, normal RV.  - Echo (4/22): EF 45-50%, diffuse hypokinesis, normal RV.  - Echo (4/24): EF 50-55%, normal RV, mild MR.  2. Mitral regurgitation: Moderate-severe functional MR by TEE in 11/19.  Echo (4/21) with only mild MR.  3. DM2 4. Hyperlipidemia.  5. GERD 6. Zio patch x 7 days (9/20): short runs SVT, NSVT (rare).  7. COVID-19 infection: 2/21.  8. Sleep study in 10/23 did not show OSA.   Current Outpatient Medications  Medication Sig Dispense Refill   acetaminophen  (TYLENOL ) 500 MG tablet Take 500 mg by mouth every 6 (six) hours as needed for moderate pain.     aspirin  81 MG tablet Take 81 mg by mouth daily.     Blood Glucose Monitoring Suppl (ONE TOUCH ULTRA 2) w/Device KIT USE TO TEST UP TO 4 TIMES PER DAY. 1 each 1   cholecalciferol  (VITAMIN D ) 1000 UNITS tablet Take 1,000 Units by mouth daily.     ENTRESTO  24-26 MG TAKE 1 TABLET BY MOUTH TWICE A DAY 180 tablet 2   furosemide  (LASIX ) 20 MG tablet Take 20 mg by mouth as needed.     Guaifenesin  (  MUCINEX  MAXIMUM STRENGTH) 1200 MG TB12 Take 1 tablet (1,200 mg total) by mouth every 12 (twelve) hours as needed. 14 tablet 1   ivabradine  (CORLANOR) 5 MG TABS tablet Take 1 tablet (5 mg total) by mouth 2 (two) times daily. 180 tablet 2   metoprolol  succinate (TOPROL -XL) 25 MG 24 hr tablet TAKE 2 TABLETS (50 MG TOTAL) BY MOUTH EVERY MORNING AND 1 TABLET (25 MG TOTAL) EVERY EVENING 270 tablet 3   Multiple Vitamins-Minerals (MULTIVITAMIN WITH MINERALS) tablet Take 1 tablet by mouth daily.     nitroGLYCERIN  (NITROSTAT ) 0.4 MG SL tablet Place 1 tablet (0.4 mg total) under the tongue every 5 (five) minutes as needed for chest pain. 25 tablet 1   ONETOUCH ULTRA  test strip USE TO TEST UP TO 4 TIMES A DAY. 100 strip 11   pantoprazole  (PROTONIX ) 40 MG tablet TAKE 1 TABLET BY MOUTH EVERY DAY 90 tablet 1   Pitavastatin  Calcium  1 MG TABS TAKE 1 TABLET BY MOUTH EVERY DAY 90 tablet 1   Semaglutide ,0.25 or 0.5MG /DOS, (OZEMPIC , 0.25 OR 0.5 MG/DOSE,) 2 MG/3ML SOPN INJECT 0.5MG  DOSE INTO THE SKIN EVERY 7 DAYS 2 mL 2   spironolactone  (ALDACTONE ) 25 MG tablet TAKE 1 TABLET BY MOUTH EVERY DAY 90 tablet 3   thiamine  (VITAMIN B-1) 100 MG tablet Take 100 mg by mouth daily.     XIGDUO  XR 11-998 MG TB24 TAKE 1 TABLET BY MOUTH TWICE A DAY 180 tablet 1   No current facility-administered medications for this encounter.    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  Education: [employer].         Social Drivers of Health   Financial Resource Strain: Patient Declined (07/05/2022)   Overall Financial Resource Strain (CARDIA)    Difficulty of Paying Living Expenses: Patient declined  Food Insecurity: Unknown (07/05/2022)   Hunger Vital Sign    Worried About Running Out of Food in the Last Year: Not on file    Ran Out of Food in the Last Year: Never true  Transportation Needs: No Transportation Needs (07/05/2022)   PRAPARE -  Administrator, Civil Service (Medical): No    Lack of Transportation (Non-Medical): No  Physical Activity: Unknown (07/05/2022)   Exercise Vital Sign    Days of Exercise per Week: Patient declined    Minutes of Exercise per Session: Not on file  Stress: Not on file  Social Connections: Unknown (07/05/2022)   Social Connection and Isolation Panel [NHANES]    Frequency of Communication with Friends and Family: More than three times a week  Frequency of Social Gatherings with Friends and Family: Patient declined    Attends Religious Services: Patient declined    Active Member of Clubs or Organizations: Patient declined    Attends Engineer, structural: Not on file    Marital Status: Married  Catering manager Violence: Not on file      Family History  Problem Relation Age of Onset   Heart disease Mother    COPD Mother    Emphysema Mother    Cancer Mother        oral   Heart disease Father    Diabetes Father    COPD Father    Vision loss Father    Skin cancer Father        skin   Hyperlipidemia Father    Hypertension Father    Heart disease Sister    Graves' disease Sister    Diabetes Sister    Hyperlipidemia Sister    Heart failure Sister 35   Heart disease Brother    Hyperlipidemia Brother    Cancer Paternal Grandmother        unknown   Prostate cancer Paternal Grandfather        Prostate cancer   Breast cancer Neg Hx     Vitals:   01/04/24 1055  BP: 102/62  Pulse: 79  SpO2: 98%  Weight: 62.6 kg (138 lb)      Wt Readings from Last 3 Encounters:  01/04/24 62.6 kg (138 lb)  10/04/23 62.6 kg (137 lb 14.4 oz)  07/12/23 61.1 kg (134 lb 9.6 oz)     PHYSICAL EXAM: General:  well appearing.  No respiratory difficulty. Walked into clinic Neck: supple. JVD flat.  Cor: PMI nondisplaced. Regular rate & rhythm. No rubs, gallops or murmurs. Lungs: clear Extremities: no cyanosis, clubbing, rash, edema  Neuro: alert & oriented x 3. Moves all 4  extremities w/o difficulty. Affect pleasant.   ASSESSMENT & PLAN: 1. Chronic diastolic, previously systolic CHF: Nonischemic cardiomyopathy by [facility] 06/2018. cMRI showed EF 28%, mildly decreased RV size, and no myocardial LGE. Echo in 7/20 showed EF up to 40-45% and echo in 4/21 showed EF up to 50%.  Echo in 4/22 with stable EF 45-50%. Echo in 4/24 with EF higher at 50-55%.   - NYHA class I-II, weight stable, not volume overloaded on exam.  - Continue prn use of Lasix .   - Continue Toprol  XL 50 qam/25 qpm.  - Continue Entresto  24/26 mg BID.  - Continue spironolactone  25 mg daily.  - Continue Farxiga  10 mg daily.  - Continue Ivabradine  5 mg bid  - BMET/BNP today.  2. Hyperlipidemia: On Livalo . Lipids ok in 9/24. Update at follow up.  3. Fatigue: Check anemia panel.   Follow up 6 months with Dr. Mitzie Anda + echo   Sheryl Donna, 01/04/2024

## 2024-01-04 ENCOUNTER — Telehealth: Payer: Self-pay | Admitting: Pharmacist

## 2024-01-04 ENCOUNTER — Ambulatory Visit (HOSPITAL_COMMUNITY)
Admission: RE | Admit: 2024-01-04 | Discharge: 2024-01-04 | Disposition: A | Discharge status: Home / Self Care | Payer: 59 | Source: Ambulatory Visit | Attending: Internal Medicine | Admitting: Internal Medicine

## 2024-01-04 ENCOUNTER — Ambulatory Visit (HOSPITAL_COMMUNITY): Payer: Self-pay | Admitting: Internal Medicine

## 2024-01-04 ENCOUNTER — Encounter (HOSPITAL_COMMUNITY): Payer: Self-pay

## 2024-01-04 VITALS — BP 102/62 | HR 79 | Wt 138.0 lb

## 2024-01-04 DIAGNOSIS — R000 Tachycardia, unspecified: Secondary | ICD-10-CM | POA: Insufficient documentation

## 2024-01-04 DIAGNOSIS — Z8616 Personal history of COVID-19: Secondary | ICD-10-CM | POA: Diagnosis not present

## 2024-01-04 DIAGNOSIS — K219 Gastro-esophageal reflux disease without esophagitis: Secondary | ICD-10-CM | POA: Diagnosis not present

## 2024-01-04 DIAGNOSIS — Z7985 Long-term (current) use of injectable non-insulin antidiabetic drugs: Secondary | ICD-10-CM | POA: Diagnosis not present

## 2024-01-04 DIAGNOSIS — R002 Palpitations: Secondary | ICD-10-CM | POA: Insufficient documentation

## 2024-01-04 DIAGNOSIS — I428 Other cardiomyopathies: Secondary | ICD-10-CM | POA: Diagnosis present

## 2024-01-04 DIAGNOSIS — Z7982 Long term (current) use of aspirin: Secondary | ICD-10-CM | POA: Insufficient documentation

## 2024-01-04 DIAGNOSIS — E785 Hyperlipidemia, unspecified: Secondary | ICD-10-CM | POA: Insufficient documentation

## 2024-01-04 DIAGNOSIS — E119 Type 2 diabetes mellitus without complications: Secondary | ICD-10-CM | POA: Diagnosis not present

## 2024-01-04 DIAGNOSIS — I340 Nonrheumatic mitral (valve) insufficiency: Secondary | ICD-10-CM | POA: Diagnosis not present

## 2024-01-04 DIAGNOSIS — Z79899 Other long term (current) drug therapy: Secondary | ICD-10-CM | POA: Insufficient documentation

## 2024-01-04 DIAGNOSIS — E669 Obesity, unspecified: Secondary | ICD-10-CM | POA: Diagnosis not present

## 2024-01-04 DIAGNOSIS — I493 Ventricular premature depolarization: Secondary | ICD-10-CM | POA: Diagnosis not present

## 2024-01-04 DIAGNOSIS — R5383 Other fatigue: Secondary | ICD-10-CM | POA: Diagnosis not present

## 2024-01-04 DIAGNOSIS — I5032 Chronic diastolic (congestive) heart failure: Secondary | ICD-10-CM

## 2024-01-04 DIAGNOSIS — I5042 Chronic combined systolic (congestive) and diastolic (congestive) heart failure: Secondary | ICD-10-CM | POA: Diagnosis present

## 2024-01-04 LAB — IRON AND TIBC
Iron: 58 ug/dL (ref 28–170)
Saturation Ratios: 14 % (ref 10.4–31.8)
TIBC: 417 ug/dL (ref 250–450)
UIBC: 359 ug/dL

## 2024-01-04 LAB — BASIC METABOLIC PANEL WITH GFR
Anion gap: 11 (ref 5–15)
BUN: 18 mg/dL (ref 8–23)
CO2: 27 mmol/L (ref 22–32)
Calcium: 10.2 mg/dL (ref 8.9–10.3)
Chloride: 103 mmol/L (ref 98–111)
Creatinine, Ser: 0.84 mg/dL (ref 0.44–1.00)
GFR, Estimated: >60 mL/min (ref >60)
Glucose, Bld: 111 mg/dL — ABNORMAL HIGH (ref 70–99)
Potassium: 4.7 mmol/L (ref 3.5–5.1)
Sodium: 141 mmol/L (ref 135–145)

## 2024-01-04 LAB — BRAIN NATRIURETIC PEPTIDE: B Natriuretic Peptide: 18.1 pg/mL (ref 0.0–100.0)

## 2024-01-04 LAB — FERRITIN: Ferritin: 13 ng/mL (ref 11–307)

## 2024-01-04 NOTE — Telephone Encounter (Signed)
 error

## 2024-01-04 NOTE — Telephone Encounter (Signed)
 Pharmacy Patient Advocate Encounter  Received notification from [employer] that the appeal for Ozempic  (0.25 or 0.5 MG/DOSE) 2MG /3ML pen-injectors has been APPROVED from 01/04/2024 through 01/03/2025  Thank you, Dene Fines, PharmD Clinical Pharmacist  [place]  Direct Dial: [phone]

## 2024-01-04 NOTE — Patient Instructions (Signed)
 Great to see you today!!!  Medication Changes:  None, continue current medications  Lab Work:  Labs done today, your results will be available in MyChart, we will contact you for abnormal readings.   Testing/Procedures:  Your physician has requested that you have an echocardiogram. Echocardiography is a painless test that uses sound waves to create images of your heart. It provides your doctor with information about the size and shape of your heart and how well your heart's chambers and valves are working. This procedure takes approximately one hour. There are no restrictions for this procedure. Please do NOT wear cologne, perfume, aftershave, or lotions (deodorant is allowed). Please arrive 15 minutes prior to your appointment time. IN 6 MONTHS  Please note: We ask at that you not bring children with you during ultrasound (echo/ vascular) testing. Due to room size and safety concerns, children are not allowed in the ultrasound rooms during exams. Our front office staff cannot provide observation of children in our lobby area while testing is being conducted. An adult accompanying a patient to their appointment will only be allowed in the ultrasound room at the discretion of the ultrasound technician under special circumstances. We apologize for any inconvenience.   Special Instructions // Education:  Do the following things EVERYDAY: Weigh yourself in the morning before breakfast. Write it down and keep it in a log. Take your medicines as prescribed Eat low salt foods--Limit salt (sodium) to 2000 mg per day.  Stay as active as you can everyday Limit all fluids for the day to less than 2 liters   Follow-Up in: 6 months with echocardiogram (December), **PLEASE CALL OUR OFFICE IN OCTOBER TO SCHEDULE THIS APPOINTMENT   At the [facility], you and your health needs are our priority. We have a designated team specialized in the treatment of Heart Failure. This Care Team  includes your primary Heart Failure Specialized Cardiologist (physician), Advanced Practice Providers (APPs- Physician Assistants and Nurse Practitioners), and Pharmacist who all work together to provide you with the care you need, when you need it.   You may see any of the following providers on your designated Care Team at your next follow up:  Dr. Jules Oar Dr. Peder Bourdon Dr. Alwin Baars Dr. Judyth Nunnery Nieves Bars, NP Ruddy Corral, [place] [facility] [place], [place] Dennise Fitz, NP [place] Lee, NP Luster Salters, PharmD   Please be sure to bring in all your medications bottles to every appointment.   Need to Contact Us :  If you have any questions or concerns before your next appointment please send us  a message through [place] or call our office at [phone].    TO LEAVE A MESSAGE FOR THE NURSE SELECT OPTION 2, PLEASE LEAVE A MESSAGE INCLUDING: YOUR NAME DATE OF BIRTH CALL BACK NUMBER REASON FOR CALL**this is important as we prioritize the call backs  YOU WILL RECEIVE A CALL BACK THE SAME DAY AS LONG AS YOU CALL BEFORE 4:00 PM

## 2024-01-16 ENCOUNTER — Other Ambulatory Visit (HOSPITAL_COMMUNITY): Payer: Self-pay | Admitting: Cardiology

## 2024-01-16 ENCOUNTER — Telehealth: Payer: Self-pay | Admitting: Pharmacy Technician

## 2024-01-16 ENCOUNTER — Other Ambulatory Visit (HOSPITAL_COMMUNITY): Payer: Self-pay | Admitting: *Deleted

## 2024-01-16 DIAGNOSIS — D508 Other iron deficiency anemias: Secondary | ICD-10-CM

## 2024-01-16 DIAGNOSIS — I5041 Acute combined systolic (congestive) and diastolic (congestive) heart failure: Secondary | ICD-10-CM

## 2024-01-16 DIAGNOSIS — D509 Iron deficiency anemia, unspecified: Secondary | ICD-10-CM

## 2024-01-16 DIAGNOSIS — I5032 Chronic diastolic (congestive) heart failure: Secondary | ICD-10-CM

## 2024-01-16 NOTE — Telephone Encounter (Signed)
 Dr. Alen Husbands / Emily [name],  Emily [name] is non preferred and will be denied if patient has not tried preferred medication. Preferred medication is Venofer. Would you like to use Venofer?  Auth Submission: NO AUTH NEEDED Site of care: Site of care: [facility] Payer: [employer] / [employer] Medication & CPT/J Code(s) submitted: Venofer (Iron Sucrose) J1756 Diagnosis Code:  Route of submission (phone, fax, portal):  Phone # Fax # Auth type: Buy/Bill HB Units/visits requested:  Reference number:  Approval from: 01/16/24 to 05/17/24

## 2024-01-17 ENCOUNTER — Encounter (HOSPITAL_COMMUNITY)
Admission: RE | Admit: 2024-01-17 | Discharge: 2024-01-17 | Disposition: A | Discharge status: Home / Self Care | Source: Ambulatory Visit | Attending: Cardiology | Admitting: Cardiology

## 2024-01-17 DIAGNOSIS — I5041 Acute combined systolic (congestive) and diastolic (congestive) heart failure: Secondary | ICD-10-CM | POA: Insufficient documentation

## 2024-01-17 DIAGNOSIS — D509 Iron deficiency anemia, unspecified: Secondary | ICD-10-CM | POA: Insufficient documentation

## 2024-01-17 MED ORDER — IRON SUCROSE 200 MG IVPB - SIMPLE MED
200.0000 mg | Status: DC
  Administered 2024-01-17: 200 mg via INTRAVENOUS
  Filled 2024-01-17: qty 200

## 2024-01-23 ENCOUNTER — Other Ambulatory Visit (HOSPITAL_COMMUNITY): Payer: Self-pay

## 2024-01-23 ENCOUNTER — Telehealth: Payer: Self-pay

## 2024-01-23 ENCOUNTER — Other Ambulatory Visit (HOSPITAL_BASED_OUTPATIENT_CLINIC_OR_DEPARTMENT_OTHER): Payer: Self-pay

## 2024-01-23 MED ORDER — OZEMPIC (0.25 OR 0.5 MG/DOSE) 2 MG/3ML ~~LOC~~ SOPN
0.5000 mg | PEN_INJECTOR | SUBCUTANEOUS | 2 refills | Status: DC
  Filled 2024-01-23: qty 3, 28d supply, fill #0

## 2024-01-23 MED ORDER — OZEMPIC (0.25 OR 0.5 MG/DOSE) 2 MG/3ML ~~LOC~~ SOPN: 0.5000 mg | PEN_INJECTOR | SUBCUTANEOUS | 2 refills | Status: DC

## 2024-01-23 NOTE — Telephone Encounter (Signed)
 Copied from CRM [id number]. Topic: Clinical - Medication Question >> Jan 23, 2024  2:16 PM Thersia C wrote: Reason for CRM: Patient called in regarding Prior Authorization on Ozempic , informed patient per note in chart that is has been approved, patient stated she needs it sent to the  [id number] [address], [facility] - [address] [place] [address] Phone: [phone] Fax: [phone]

## 2024-01-23 NOTE — Telephone Encounter (Signed)
 See encounter 01/04/24  Pharmacy Patient Advocate Encounter  Received notification from [facility] that Prior Authorization for Appealed Ozempic  has been APPROVED from 01/04/24 to 01/03/25. Ran test claim, Copay is $24.99. This test claim was processed through [facility]- copay amounts may vary at other pharmacies due to pharmacy/plan contracts, or as the patient moves through the different stages of their insurance plan.

## 2024-01-23 NOTE — Telephone Encounter (Signed)
 Copied from CRM [id number]. Topic: Referral - Prior Authorization Question >> Jan 23, 2024 10:01 AM Robinson H wrote: Reason for CRM: Patient is following up on prior authorization for the Ozempic , last note in system states sent to appeals, please update patient, thanks.  Agam [phone]

## 2024-01-23 NOTE — Addendum Note (Signed)
 Addended by: KATHRYNE MILLMAN B on: 01/23/2024 02:39 PM   Modules accepted: Orders

## 2024-01-23 NOTE — Telephone Encounter (Signed)
 Attempted to call the patient but voicemail box has not been set up yet.  Rx sent to [facility] at [facility].

## 2024-01-23 NOTE — Addendum Note (Signed)
 Addended by: KATHRYNE MILLMAN B on: 01/23/2024 02:15 PM   Modules accepted: Orders

## 2024-01-23 NOTE — Telephone Encounter (Signed)
 [facility]

## 2024-01-24 ENCOUNTER — Encounter (HOSPITAL_COMMUNITY)
Admission: RE | Admit: 2024-01-24 | Discharge: 2024-01-24 | Discharge status: Home / Self Care | Source: Ambulatory Visit | Attending: Internal Medicine

## 2024-01-24 DIAGNOSIS — D509 Iron deficiency anemia, unspecified: Secondary | ICD-10-CM

## 2024-01-24 DIAGNOSIS — I5041 Acute combined systolic (congestive) and diastolic (congestive) heart failure: Secondary | ICD-10-CM

## 2024-01-24 MED ORDER — IRON SUCROSE 200 MG IVPB - SIMPLE MED
200.0000 mg | Status: DC
  Administered 2024-01-24: 200 mg via INTRAVENOUS
  Filled 2024-01-24: qty 200

## 2024-01-31 ENCOUNTER — Encounter (HOSPITAL_COMMUNITY)
Admission: RE | Admit: 2024-01-31 | Discharge: 2024-01-31 | Disposition: A | Discharge status: Home / Self Care | Source: Ambulatory Visit | Attending: Internal Medicine | Admitting: Internal Medicine

## 2024-01-31 DIAGNOSIS — I5041 Acute combined systolic (congestive) and diastolic (congestive) heart failure: Secondary | ICD-10-CM | POA: Diagnosis present

## 2024-01-31 DIAGNOSIS — D509 Iron deficiency anemia, unspecified: Secondary | ICD-10-CM | POA: Insufficient documentation

## 2024-01-31 MED ORDER — IRON SUCROSE 200 MG IVPB - SIMPLE MED
200.0000 mg | Status: DC
  Administered 2024-01-31: 200 mg via INTRAVENOUS
  Filled 2024-01-31: qty 200

## 2024-02-07 ENCOUNTER — Encounter (HOSPITAL_COMMUNITY)
Admission: RE | Admit: 2024-02-07 | Discharge: 2024-02-07 | Disposition: A | Discharge status: Home / Self Care | Source: Ambulatory Visit | Attending: Internal Medicine

## 2024-02-07 DIAGNOSIS — I5041 Acute combined systolic (congestive) and diastolic (congestive) heart failure: Secondary | ICD-10-CM | POA: Diagnosis not present

## 2024-02-07 DIAGNOSIS — D509 Iron deficiency anemia, unspecified: Secondary | ICD-10-CM

## 2024-02-07 MED ORDER — IRON SUCROSE 200 MG IVPB - SIMPLE MED
200.0000 mg | Status: DC
  Administered 2024-02-07: 200 mg via INTRAVENOUS
  Filled 2024-02-07: qty 200

## 2024-02-14 ENCOUNTER — Encounter (HOSPITAL_COMMUNITY)
Admission: RE | Admit: 2024-02-14 | Discharge: 2024-02-14 | Disposition: A | Discharge status: Home / Self Care | Source: Ambulatory Visit | Attending: Internal Medicine

## 2024-02-14 DIAGNOSIS — I5041 Acute combined systolic (congestive) and diastolic (congestive) heart failure: Secondary | ICD-10-CM

## 2024-02-14 DIAGNOSIS — D509 Iron deficiency anemia, unspecified: Secondary | ICD-10-CM

## 2024-02-14 MED ORDER — IRON SUCROSE 200 MG IVPB - SIMPLE MED
200.0000 mg | Status: DC
  Administered 2024-02-14: 200 mg via INTRAVENOUS
  Filled 2024-02-14: qty 200

## 2024-03-03 ENCOUNTER — Other Ambulatory Visit: Payer: Self-pay | Admitting: Internal Medicine

## 2024-03-03 DIAGNOSIS — E7800 Pure hypercholesterolemia, unspecified: Secondary | ICD-10-CM

## 2024-03-28 ENCOUNTER — Other Ambulatory Visit: Payer: Self-pay | Admitting: Internal Medicine

## 2024-04-11 ENCOUNTER — Ambulatory Visit: Admitting: Internal Medicine

## 2024-04-11 ENCOUNTER — Other Ambulatory Visit: Payer: Self-pay | Admitting: Internal Medicine

## 2024-04-11 ENCOUNTER — Encounter: Payer: Self-pay | Admitting: Internal Medicine

## 2024-04-11 VITALS — BP 102/64 | HR 81 | Temp 98.0°F | Ht 59.75 in | Wt 140.3 lb

## 2024-04-11 DIAGNOSIS — I5032 Chronic diastolic (congestive) heart failure: Secondary | ICD-10-CM

## 2024-04-11 DIAGNOSIS — Z0000 Encounter for general adult medical examination without abnormal findings: Secondary | ICD-10-CM

## 2024-04-11 DIAGNOSIS — Z124 Encounter for screening for malignant neoplasm of cervix: Secondary | ICD-10-CM

## 2024-04-11 DIAGNOSIS — Z7985 Long-term (current) use of injectable non-insulin antidiabetic drugs: Secondary | ICD-10-CM

## 2024-04-11 DIAGNOSIS — E7800 Pure hypercholesterolemia, unspecified: Secondary | ICD-10-CM

## 2024-04-11 DIAGNOSIS — E119 Type 2 diabetes mellitus without complications: Secondary | ICD-10-CM

## 2024-04-11 DIAGNOSIS — Z1239 Encounter for other screening for malignant neoplasm of breast: Secondary | ICD-10-CM

## 2024-04-11 DIAGNOSIS — Z23 Encounter for immunization: Secondary | ICD-10-CM | POA: Diagnosis not present

## 2024-04-11 DIAGNOSIS — Z1211 Encounter for screening for malignant neoplasm of colon: Secondary | ICD-10-CM

## 2024-04-11 LAB — COMPREHENSIVE METABOLIC PANEL WITH GFR
ALT: 21 U/L (ref 0–35)
AST: 20 U/L (ref 0–37)
Albumin: 4.6 g/dL (ref 3.5–5.2)
Alkaline Phosphatase: 61 U/L (ref 39–117)
BUN: 19 mg/dL (ref 6–23)
CO2: 29 meq/L (ref 19–32)
Calcium: 10.1 mg/dL (ref 8.4–10.5)
Chloride: 97 meq/L (ref 96–112)
Creatinine, Ser: 0.96 mg/dL (ref 0.40–1.20)
GFR: 62.45 mL/min (ref >60.00)
Glucose, Bld: 131 mg/dL — ABNORMAL HIGH (ref 70–99)
Potassium: 3.8 meq/L (ref 3.5–5.1)
Sodium: 139 meq/L (ref 135–145)
Total Bilirubin: 0.5 mg/dL (ref 0.2–1.2)
Total Protein: 7.6 g/dL (ref 6.0–8.3)

## 2024-04-11 LAB — CBC WITH DIFFERENTIAL/PLATELET
Basophils Absolute: 0 K/uL (ref 0.0–0.1)
Basophils Relative: 0.4 % (ref 0.0–3.0)
Eosinophils Absolute: 0.2 K/uL (ref 0.0–0.7)
Eosinophils Relative: 2.2 % (ref 0.0–5.0)
HCT: 44.6 % (ref 36.0–46.0)
Hemoglobin: 15.1 g/dL — ABNORMAL HIGH (ref 12.0–15.0)
Lymphocytes Relative: 27.2 % (ref 12.0–46.0)
Lymphs Abs: 2.1 K/uL (ref 0.7–4.0)
MCHC: 33.9 g/dL (ref 30.0–36.0)
MCV: 90 fl (ref 78.0–100.0)
Monocytes Absolute: 0.6 K/uL (ref 0.1–1.0)
Monocytes Relative: 7.5 % (ref 3.0–12.0)
Neutro Abs: 5 K/uL (ref 1.4–7.7)
Neutrophils Relative %: 62.7 % (ref 43.0–77.0)
Platelets: 257 K/uL (ref 150.0–400.0)
RBC: 4.95 Mil/uL (ref 3.87–5.11)
RDW: 14.6 % (ref 11.5–15.5)
WBC: 7.9 K/uL (ref 4.0–10.5)

## 2024-04-11 LAB — LIPID PANEL
Cholesterol: 163 mg/dL (ref 0–200)
HDL: 41.8 mg/dL (ref >39.00)
LDL Cholesterol: 76 mg/dL (ref 0–99)
NonHDL: 121.28
Total CHOL/HDL Ratio: 4
Triglycerides: 227 mg/dL — ABNORMAL HIGH (ref 0.0–149.0)
VLDL: 45.4 mg/dL — ABNORMAL HIGH (ref 0.0–40.0)

## 2024-04-11 LAB — MICROALBUMIN / CREATININE URINE RATIO
Creatinine,U: 57.5 mg/dL
Microalb Creat Ratio: UNDETERMINED mg/g (ref 0.0–30.0)
Microalb, Ur: <0.7 mg/dL

## 2024-04-11 LAB — VITAMIN B12: Vitamin B-12: 358 pg/mL (ref 211–911)

## 2024-04-11 LAB — HEMOGLOBIN A1C: Hgb A1c MFr Bld: 6.6 % — ABNORMAL HIGH (ref 4.6–6.5)

## 2024-04-11 LAB — VITAMIN D 25 HYDROXY (VIT D DEFICIENCY, FRACTURES): VITD: 40.99 ng/mL (ref 30.00–100.00)

## 2024-04-11 LAB — TSH: TSH: 2.11 u[IU]/mL (ref 0.35–5.50)

## 2024-04-11 MED ORDER — SACUBITRIL-VALSARTAN 24-26 MG PO TABS: 1.0000 | ORAL_TABLET | Freq: Two times a day (BID) | ORAL | 2 refills | Status: DC

## 2024-04-11 MED ORDER — OZEMPIC (0.25 OR 0.5 MG/DOSE) 2 MG/3ML ~~LOC~~ SOPN: 0.5000 mg | PEN_INJECTOR | SUBCUTANEOUS | 2 refills | Status: AC

## 2024-04-11 NOTE — Addendum Note (Signed)
 Addended by: KATHRYNE MILLMAN B on: 04/11/2024 10:02 AM   Modules accepted: Orders

## 2024-04-11 NOTE — Progress Notes (Signed)
 Established Patient Office Visit     CC/Reason for Visit: Annual preventive exam  HPI: Emily [name] is a 63 y.o. female who is coming in today for the above mentioned reasons. Past Medical History is significant for: Type 2 diabetes, hyperlipidemia, nonischemic cardiomyopathy with combined heart failure, mitral valve regurgitation.  She follows with cardiology routinely.  She is due for flu and pneumonia vaccines.  She is due for all age-appropriate cancer screening.  She is feeling well and has no acute concerns or complaints.   Past Medical/Surgical History: Past Medical History:  Diagnosis Date   Acute combined systolic and diastolic heart failure (HCC) 06/16/2018   Allergy    CHF (congestive heart failure) (HCC)    GERD (gastroesophageal reflux disease)    NICM (nonischemic cardiomyopathy) (HCC) 06/21/2018   NSVT (nonsustained ventricular tachycardia) (HCC) 06/13/2015   PVC's (premature ventricular contractions) 06/11/2015   S/P cardiac cath, 06/20/18 06/21/2018   Type 2 diabetes mellitus (HCC)     Past Surgical History:  Procedure Laterality Date   CARDIAC CATHETERIZATION N/A 06/19/2015   Procedure: Left Heart Cath and Coronary Angiography;  Surgeon: Alm LELON Clay, MD;  Location: [facility] INVASIVE CV LAB;  Service: Cardiovascular;  Laterality: N/A;   RIGHT/LEFT HEART CATH AND CORONARY ANGIOGRAPHY N/A 06/19/2018   Procedure: RIGHT/LEFT HEART CATH AND CORONARY ANGIOGRAPHY;  Surgeon: Burnard Debby LABOR, MD;  Location: [facility] INVASIVE CV LAB;  Service: Cardiovascular;  Laterality: N/A;   TEE WITHOUT CARDIOVERSION N/A 06/20/2018   Procedure: TRANSESOPHAGEAL ECHOCARDIOGRAM (TEE);  Surgeon: Rolan Ezra RAMAN, MD;  Location: [facility] ENDOSCOPY;  Service: Cardiovascular;  Laterality: N/A;   TONSILLECTOMY     when she was 3    Social History:  reports that she has never smoked. She has never used smokeless tobacco. She reports current alcohol use. She reports that she does not use  drugs.  Allergies: Allergies  Allergen Reactions   Hornet Venom Swelling   Metoprolol      depression   Tetanus Toxoid-Containing Vaccines Other (See Comments)    Big red rash and swelling in the muscle    Family History:  Family History  Problem Relation Age of Onset   Heart disease Mother    COPD Mother    Emphysema Mother    Cancer Mother        oral   Heart disease Father    Diabetes Father    COPD Father    Vision loss Father    Skin cancer Father        skin   Hyperlipidemia Father    Hypertension Father    Heart disease Sister    Graves' disease Sister    Diabetes Sister    Hyperlipidemia Sister    Heart failure Sister 35   Heart disease Brother    Hyperlipidemia Brother    Cancer Paternal Grandmother        unknown   Prostate cancer Paternal Grandfather        Prostate cancer   Breast cancer Neg Hx      Current Outpatient Medications:    acetaminophen  (TYLENOL ) 500 MG tablet, Take 500 mg by mouth every 6 (six) hours as needed for moderate pain., Disp: , Rfl:    aspirin  81 MG tablet, Take 81 mg by mouth daily., Disp: , Rfl:    Blood Glucose Monitoring Suppl (ONE TOUCH ULTRA 2) w/Device KIT, USE TO TEST UP TO 4 TIMES PER DAY., Disp: 1 each, Rfl: 1   cholecalciferol  (  VITAMIN D ) 1000 UNITS tablet, Take 1,000 Units by mouth daily., Disp: , Rfl:    furosemide  (LASIX ) 20 MG tablet, Take 20 mg by mouth as needed., Disp: , Rfl:    Guaifenesin  (MUCINEX  MAXIMUM STRENGTH) 1200 MG TB12, Take 1 tablet (1,200 mg total) by mouth every 12 (twelve) hours as needed., Disp: 14 tablet, Rfl: 1   ivabradine  (CORLANOR) 5 MG TABS tablet, Take 1 tablet (5 mg total) by mouth 2 (two) times daily., Disp: 180 tablet, Rfl: 2   metoprolol  succinate (TOPROL -XL) 25 MG 24 hr tablet, TAKE 2 TABLETS (50 MG TOTAL) BY MOUTH EVERY MORNING AND 1 TABLET (25 MG TOTAL) EVERY EVENING, Disp: 270 tablet, Rfl: 3   Multiple Vitamins-Minerals (MULTIVITAMIN WITH MINERALS) tablet, Take 1 tablet by mouth  daily., Disp: , Rfl:    nitroGLYCERIN  (NITROSTAT ) 0.4 MG SL tablet, Place 1 tablet (0.4 mg total) under the tongue every 5 (five) minutes as needed for chest pain., Disp: 25 tablet, Rfl: 1   ONETOUCH ULTRA test strip, USE TO TEST UP TO 4 TIMES A DAY., Disp: 100 strip, Rfl: 11   pantoprazole  (PROTONIX ) 40 MG tablet, TAKE 1 TABLET BY MOUTH EVERY DAY, Disp: 90 tablet, Rfl: 1   Pitavastatin  Calcium  1 MG TABS, TAKE 1 TABLET BY MOUTH EVERY DAY, Disp: 90 tablet, Rfl: 0   spironolactone  (ALDACTONE ) 25 MG tablet, TAKE 1 TABLET BY MOUTH EVERY DAY, Disp: 90 tablet, Rfl: 3   thiamine  (VITAMIN B-1) 100 MG tablet, Take 100 mg by mouth daily., Disp: , Rfl:    XIGDUO  XR 11-998 MG TB24, TAKE 1 TABLET BY MOUTH TWICE A DAY, Disp: 180 tablet, Rfl: 1   sacubitril -valsartan  (ENTRESTO ) 24-26 MG, Take 1 tablet by mouth 2 (two) times daily., Disp: 180 tablet, Rfl: 2   Semaglutide ,0.25 or 0.5MG /DOS, (OZEMPIC , 0.25 OR 0.5 MG/DOSE,) 2 MG/3ML SOPN, Inject 0.5 mg into the skin once a week., Disp: 3 mL, Rfl: 2  Review of Systems:  Negative unless indicated in HPI.   Physical Exam: Vitals:   04/11/24 0814  BP: 102/64  Pulse: 81  Temp: 98 F (36.7 C)  TempSrc: Oral  SpO2: 99%  Weight: 140 lb 4.8 oz (63.6 kg)  Height: 4' 11.75 (1.518 m)    Body mass index is 27.63 kg/m.   Physical Exam Vitals reviewed.  Constitutional:      General: She is not in acute distress.    Appearance: Normal appearance. She is not ill-appearing, toxic-appearing or diaphoretic.  HENT:     Head: Normocephalic.     Right Ear: Tympanic membrane, ear canal and external ear normal. There is no impacted cerumen.     Left Ear: Tympanic membrane, ear canal and external ear normal. There is no impacted cerumen.     Nose: Nose normal.     Mouth/Throat:     Mouth: Mucous membranes are moist.     Pharynx: Oropharynx is clear. No oropharyngeal exudate or posterior oropharyngeal erythema.  Eyes:     General: No scleral icterus.       Right  eye: No discharge.        Left eye: No discharge.     Conjunctiva/sclera: Conjunctivae normal.     Pupils: Pupils are equal, round, and reactive to light.  Neck:     Vascular: No carotid bruit.  Cardiovascular:     Rate and Rhythm: Normal rate and regular rhythm.     Pulses: Normal pulses.     Heart sounds: Normal heart sounds.  Pulmonary:  Effort: Pulmonary effort is normal. No respiratory distress.     Breath sounds: Normal breath sounds.  Abdominal:     General: Abdomen is flat. Bowel sounds are normal.     Palpations: Abdomen is soft.  Musculoskeletal:        General: Normal range of motion.     Cervical back: Normal range of motion.  Skin:    General: Skin is warm and dry.  Neurological:     General: No focal deficit present.     Mental Status: She is alert and oriented to person, place, and time. Mental status is at baseline.  Psychiatric:        Mood and Affect: Mood normal.        Behavior: Behavior normal.        Thought Content: Thought content normal.        Judgment: Judgment normal.      Impression and Plan:  Encounter for preventive health examination  Elevated LDL cholesterol level -     Lipid panel; Future  Non-insulin  treated type 2 diabetes mellitus (HCC) -     Hemoglobin A1c; Future -     CBC with Differential/Platelet; Future -     Comprehensive metabolic panel with GFR; Future -     Microalbumin / creatinine urine ratio; Future -     Ozempic  (0.25 or 0.5 MG/DOSE); Inject 0.5 mg into the skin once a week.  Dispense: 3 mL; Refill: 2  Chronic diastolic heart failure (HCC) -     TSH; Future -     Vitamin B12; Future -     VITAMIN D  25 Hydroxy (Vit-D Deficiency, Fractures); Future -     Sacubitril -Valsartan ; Take 1 tablet by mouth 2 (two) times daily.  Dispense: 180 tablet; Refill: 2  Screening breast examination -     Digital Screening Mammogram, Left and Right; Future  Screening for malignant neoplasm of colon -     Ambulatory referral to  Gastroenterology  Screening for cervical cancer -     Ambulatory referral to Gynecology   -Recommend routine eye and dental care. -Healthy lifestyle discussed in detail. -Labs to be updated today. -Prostate cancer screening: N/A Health Maintenance  Topic Date Due   Pneumococcal Vaccine for age over 17 (2 of 2 - PCV) 09/08/2017   Yearly kidney health urinalysis for diabetes  07/03/2018   Colon Cancer Screening  10/25/2020   Eye exam for diabetics  04/22/2023   Breast Cancer Screening  11/19/2023   Flu Shot  03/01/2024   COVID-19 Vaccine (3 - 2025-26 season) 04/01/2024   Hemoglobin A1C  04/05/2024   Complete foot exam   10/03/2024   Yearly kidney function blood test for diabetes  01/03/2025   Pap with HPV screening  10/28/2026   Hepatitis C Screening  Completed   HIV Screening  Completed   Zoster (Shingles) Vaccine  Completed   Hepatitis B Vaccine  Aged Out   HPV Vaccine  Aged Out   Meningitis B Vaccine  Aged Out   DTaP/Tdap/Td vaccine  Discontinued    - Influenza and PCV 20 administered in office today. - Referral for mammogram, colonoscopy and GYN have been placed.    Tully Theophilus Andrews, MD [facility]

## 2024-04-14 ENCOUNTER — Other Ambulatory Visit (HOSPITAL_COMMUNITY): Payer: Self-pay | Admitting: Cardiology

## 2024-04-14 ENCOUNTER — Other Ambulatory Visit: Payer: Self-pay | Admitting: Internal Medicine

## 2024-04-14 DIAGNOSIS — E119 Type 2 diabetes mellitus without complications: Secondary | ICD-10-CM

## 2024-04-14 DIAGNOSIS — K219 Gastro-esophageal reflux disease without esophagitis: Secondary | ICD-10-CM

## 2024-04-17 ENCOUNTER — Ambulatory Visit: Payer: Self-pay | Admitting: Internal Medicine

## 2024-04-29 ENCOUNTER — Other Ambulatory Visit: Payer: Self-pay | Admitting: Internal Medicine

## 2024-04-29 ENCOUNTER — Telehealth: Payer: Self-pay | Admitting: *Deleted

## 2024-04-29 DIAGNOSIS — K219 Gastro-esophageal reflux disease without esophagitis: Secondary | ICD-10-CM

## 2024-04-29 NOTE — Telephone Encounter (Signed)
 Copied from CRM #[id number]. Topic: General - Other >> Apr 29, 2024  4:33 PM Jasmin G wrote: Reason for CRM: Pt called to ask if instead of getting a referral fro a colonoscopy it would be enough for her to get the coulegard testing done. Call pt back at [phone].

## 2024-04-29 NOTE — Telephone Encounter (Unsigned)
 Copied from CRM [id number]. Topic: Clinical - Medication Refill >> Apr 29, 2024  4:31 PM Jasmin G wrote: Medication: pantoprazole  (PROTONIX ) 40 MG tablet  Has the patient contacted their pharmacy? Yes (Agent: If no, request that the patient contact the pharmacy for the refill. If patient does not wish to contact the pharmacy document the reason why and proceed with request.) (Agent: If yes, when and what did the pharmacy advise?)  This is the patient's preferred pharmacy:  [id number] [address] - [address] [place] [address] Phone: [phone] Fax: [phone]  Is this the correct pharmacy for this prescription? Yes If no, delete pharmacy and type the correct one.   Has the prescription been filled recently? Yes  Is the patient out of the medication? No  Has the patient been seen for an appointment in the last year OR does the patient have an upcoming appointment? Yes  Can we respond through MyChart? Yes  Agent: Please be advised that Rx refills may take up to 3 business days. We ask that you follow-up with your pharmacy.

## 2024-04-30 ENCOUNTER — Telehealth: Payer: Self-pay | Admitting: *Deleted

## 2024-04-30 ENCOUNTER — Other Ambulatory Visit (HOSPITAL_COMMUNITY): Payer: Self-pay

## 2024-04-30 ENCOUNTER — Telehealth: Payer: Self-pay

## 2024-04-30 ENCOUNTER — Other Ambulatory Visit: Payer: Self-pay | Admitting: Internal Medicine

## 2024-04-30 DIAGNOSIS — E7800 Pure hypercholesterolemia, unspecified: Secondary | ICD-10-CM

## 2024-04-30 MED ORDER — PANTOPRAZOLE SODIUM 40 MG PO TBEC: 40.0000 mg | DELAYED_RELEASE_TABLET | Freq: Every day | ORAL | 1 refills | Status: AC

## 2024-04-30 NOTE — Telephone Encounter (Signed)
 Pharmacy Patient Advocate Encounter   Received notification from Onbase that prior authorization for Pitavastatin  1 is required/requested.   Insurance verification completed.   The patient is insured through [facility] MEDICAID .   Per test claim: PA required; PA submitted to above mentioned insurance via Latent Key/confirmation #/EOC [id number] Status is pending

## 2024-04-30 NOTE — Telephone Encounter (Signed)
 Left message on machine for patient to return our call

## 2024-04-30 NOTE — Telephone Encounter (Signed)
 Copied from CRM [id number]. Topic: General - Other >> Apr 30, 2024 11:53 AM Mercedes MATSU wrote: Reason for CRM: Patient called in stating that she is returning Rachels call, she stated she just had a missed call from her. Patient can be reached at [phone].

## 2024-05-01 ENCOUNTER — Other Ambulatory Visit (HOSPITAL_COMMUNITY): Payer: Self-pay

## 2024-05-01 NOTE — Telephone Encounter (Signed)
 Spoke to the patient.  See previous telephone note.

## 2024-05-01 NOTE — Telephone Encounter (Signed)
 Patient is aware.

## 2024-05-01 NOTE — Telephone Encounter (Signed)
 Pharmacy Patient Advocate Encounter  Received notification from [facility] MEDICAID that Prior Authorization for Pitavastatin  1 has been DENIED.  No reason given; No denial letter received via Fax or CMM. It has been requested and will be uploaded to the media tab once received.   PA #/Case ID/Reference #: # [id number]

## 2024-05-01 NOTE — Telephone Encounter (Signed)
 PA was denied

## 2024-05-06 ENCOUNTER — Other Ambulatory Visit: Payer: Self-pay | Admitting: Internal Medicine

## 2024-07-10 ENCOUNTER — Encounter (HOSPITAL_COMMUNITY): Admitting: Cardiology

## 2024-07-10 ENCOUNTER — Other Ambulatory Visit (HOSPITAL_COMMUNITY)

## 2024-07-29 ENCOUNTER — Ambulatory Visit (HOSPITAL_COMMUNITY)
Admission: RE | Admit: 2024-07-29 | Discharge: 2024-07-29 | Disposition: A | Source: Ambulatory Visit | Attending: Cardiology | Admitting: Cardiology

## 2024-07-29 ENCOUNTER — Encounter (HOSPITAL_COMMUNITY): Payer: Self-pay | Admitting: Cardiology

## 2024-07-29 ENCOUNTER — Ambulatory Visit (HOSPITAL_COMMUNITY)
Admission: RE | Admit: 2024-07-29 | Discharge: 2024-07-29 | Disposition: A | Discharge status: Home / Self Care | Source: Ambulatory Visit | Attending: Cardiology | Admitting: Cardiology

## 2024-07-29 VITALS — BP 116/58 | HR 73 | Wt 140.2 lb

## 2024-07-29 DIAGNOSIS — E119 Type 2 diabetes mellitus without complications: Secondary | ICD-10-CM | POA: Diagnosis not present

## 2024-07-29 DIAGNOSIS — E669 Obesity, unspecified: Secondary | ICD-10-CM | POA: Diagnosis not present

## 2024-07-29 DIAGNOSIS — Z7985 Long-term (current) use of injectable non-insulin antidiabetic drugs: Secondary | ICD-10-CM | POA: Diagnosis not present

## 2024-07-29 DIAGNOSIS — I493 Ventricular premature depolarization: Secondary | ICD-10-CM | POA: Insufficient documentation

## 2024-07-29 DIAGNOSIS — I5032 Chronic diastolic (congestive) heart failure: Secondary | ICD-10-CM | POA: Insufficient documentation

## 2024-07-29 DIAGNOSIS — Z79899 Other long term (current) drug therapy: Secondary | ICD-10-CM | POA: Diagnosis not present

## 2024-07-29 DIAGNOSIS — E7800 Pure hypercholesterolemia, unspecified: Secondary | ICD-10-CM

## 2024-07-29 DIAGNOSIS — I428 Other cardiomyopathies: Secondary | ICD-10-CM | POA: Diagnosis not present

## 2024-07-29 DIAGNOSIS — I340 Nonrheumatic mitral (valve) insufficiency: Secondary | ICD-10-CM | POA: Diagnosis not present

## 2024-07-29 DIAGNOSIS — K219 Gastro-esophageal reflux disease without esophagitis: Secondary | ICD-10-CM | POA: Diagnosis not present

## 2024-07-29 DIAGNOSIS — Z7984 Long term (current) use of oral hypoglycemic drugs: Secondary | ICD-10-CM | POA: Insufficient documentation

## 2024-07-29 LAB — BASIC METABOLIC PANEL WITH GFR
Anion gap: 11 (ref 5–15)
BUN: 13 mg/dL (ref 8–23)
CO2: 28 mmol/L (ref 22–32)
Calcium: 9.9 mg/dL (ref 8.9–10.3)
Chloride: 101 mmol/L (ref 98–111)
Creatinine, Ser: 0.85 mg/dL (ref 0.44–1.00)
GFR, Estimated: >60 mL/min
Glucose, Bld: 115 mg/dL — ABNORMAL HIGH (ref 70–99)
Potassium: 4.4 mmol/L (ref 3.5–5.1)
Sodium: 141 mmol/L (ref 135–145)

## 2024-07-29 LAB — ECHOCARDIOGRAM COMPLETE
Area-P 1/2: 3.74 cm2
Calc EF: 54.3 %
S' Lateral: 3.4 cm
Single Plane A2C EF: 51.3 %
Single Plane A4C EF: 55.2 %

## 2024-07-29 MED ORDER — ROSUVASTATIN CALCIUM 10 MG PO TABS: 10.0000 mg | ORAL_TABLET | Freq: Every day | ORAL | 3 refills | Status: AC

## 2024-07-29 NOTE — Patient Instructions (Addendum)
 Medication Changes:  START Crestor  10 mg Daily  Lab Work:  Labs done today, your results will be available in MyChart, we will contact you for abnormal readings.  Your physician recommends that you return for lab work in: 2 months   Special Instructions // Education:  Do the following things EVERYDAY: Weigh yourself in the morning before breakfast. Write it down and keep it in a log. Take your medicines as prescribed Eat low salt foods--Limit salt (sodium) to 2000 mg per day.  Stay as active as you can everyday Limit all fluids for the day to less than 2 liters   Follow-Up in: 6 months    At the [facility], you and your health needs are our priority. We have a designated team specialized in the treatment of Heart Failure. This Care Team includes your primary Heart Failure Specialized Cardiologist (physician), Advanced Practice Providers (APPs- Physician Assistants and Nurse Practitioners), and Pharmacist who all work together to provide you with the care you need, when you need it.   You may see any of the following providers on your designated Care Team at your next follow up:  Dr. Toribio Fuel Dr. Ezra Shuck Dr. Odis Brownie Greig Mosses, NP Caffie Shed, [place] [facility] [employer] [place], [place] Beckey Coe, NP Jordan Lee, NP Tinnie Redman, PharmD   Please be sure to bring in all your medications bottles to every appointment.   Need to Contact Us :  If you have any questions or concerns before your next appointment please send us  a message through [place] or call our office at [phone].    TO LEAVE A MESSAGE FOR THE NURSE SELECT OPTION 2, PLEASE LEAVE A MESSAGE INCLUDING: YOUR NAME DATE OF BIRTH CALL BACK NUMBER REASON FOR CALL**this is important as we prioritize the call backs  YOU WILL RECEIVE A CALL BACK THE SAME DAY AS LONG AS YOU CALL BEFORE 4:00 PM

## 2024-07-29 NOTE — Progress Notes (Signed)
 "  [facility] Note   Referring Physician: PCP: Theophilus Andrews, Tully GRADE, MD HF Cardiology: Dr. Rolan  Chief complaint: CHF  HPI:  Emily [name] is a 63 y.o. female with Chronic systolic CHF, NICM, mitral regurgitation, DM2, GERD, HDL, and Obesity.    Admitted 11/16 - 06/21/18 with acute systolic CHF. Noted by be NICM by cMRI and cath. At least moderate, functional MR noted. Diuresed with IV lasix  and meds adjusted as tolerated.  Echo in 3/20 showed EF 30-35%, diffuse hypokinesis, mild MR.  Echo in 7/20 showed EF up to 40-45%.  Echo in 4/21 showed EF up to 50% with normal RV.  Echo in 4/22 showed EF 45-50%, diffuse hypokinesis, normal RV.   In 4/23 , she noted increase in baseline HR and symptomatic w/ palpitations. EKG showed sinus tach. Soft BP limited titration of metoprolol . She was started on ivabradine  and Zio was placed to r/o Afib.  Zio showed predominant NSR, no Afib, rare PACs and PVCs and 2 short runs of NSVT.   Echo in 4/24 showed EF 50-55%, normal RV, mild MR. Echo was done today and reviewed, EF 50-55%, normal RV, mild-moderate MR, normal IVC.   Today she returns for AHF follow up. Weight up 2 lbs.  She is off Livalo  as insurance will no longer cover it.  Found recently to have Fe deficiency anemia with plan to get colonoscopy.  Generally does ok walking on flat ground with no dyspnea.  No chest pain.  Occasional mild lightheadedness if she stands too fast.   ECG (personally reviewed):  NSR, low voltage   Labs (9/25): K 3.8, creatinine 0.96, LDL 76, TGs 227, TSH normal  Review of systems complete and found to be negative unless listed in HPI.    Past Medical History 1. Chronic systolic CHF: No significant coronary disease on LHC in 11/19. RHC (11/19) with mean RA 6, PA 42/20, mean PCWP 25, CI 1.8, PVR 1.7 WU. cMRI (11/19) showed EF 28%, mildly decreased RV size, and no myocardial LGE.  - TEE 06/20/18: LVEF 25-30%, Normal RV size and mildly  decreased function, Mod LAE, Small PFO, Mild TR, Trivial PI, moderate-severe MR but looked more severe visually. MR is central suggestive of functional MR.  - Echo (3/20): EF 30-35%, diffuse hypokinesis, mild MR.  - Echo (7/20): EF 40-45% - Echo (4/21): EF 50%, normal RV.  - Echo (4/22): EF 45-50%, diffuse hypokinesis, normal RV.  - Echo (4/24): EF 50-55%, normal RV, mild MR.  - Echo (12/25): EF 50-55%, normal RV, mild-moderate MR, normal IVC. 2. Mitral regurgitation: Moderate-severe functional MR by TEE in 11/19.  Echo (4/21) with only mild MR.  3. DM2 4. Hyperlipidemia.  5. GERD 6. Zio patch x 7 days (9/20): short runs SVT, NSVT (rare).  7. COVID-19 infection: 2/21.  8. Sleep study in 10/23 did not show OSA.   Current Outpatient Medications  Medication Sig Dispense Refill   acetaminophen  (TYLENOL ) 500 MG tablet Take 500 mg by mouth every 6 (six) hours as needed for moderate pain.     aspirin  81 MG tablet Take 81 mg by mouth daily.     Blood Glucose Monitoring Suppl (ONE TOUCH ULTRA 2) w/Device KIT USE TO TEST UP TO 4 TIMES PER DAY. 1 each 1   cholecalciferol  (VITAMIN D ) 1000 UNITS tablet Take 1,000 Units by mouth daily.     furosemide  (LASIX ) 20 MG tablet Take 20 mg by mouth as needed.  Guaifenesin  (MUCINEX  MAXIMUM STRENGTH) 1200 MG TB12 Take 1 tablet (1,200 mg total) by mouth every 12 (twelve) hours as needed. 14 tablet 1   ivabradine  (CORLANOR) 5 MG TABS tablet TAKE 1 TABLET BY MOUTH TWICE A DAY 180 tablet 2   metoprolol  succinate (TOPROL -XL) 25 MG 24 hr tablet TAKE 2 TABLETS (50 MG TOTAL) BY MOUTH EVERY MORNING AND 1 TABLET (25 MG TOTAL) EVERY EVENING 270 tablet 3   Multiple Vitamins-Minerals (MULTIVITAMIN WITH MINERALS) tablet Take 1 tablet by mouth daily.     nitroGLYCERIN  (NITROSTAT ) 0.4 MG SL tablet Place 1 tablet (0.4 mg total) under the tongue every 5 (five) minutes as needed for chest pain. 25 tablet 1   ONETOUCH ULTRA test strip USE TO TEST UP TO 4 TIMES A DAY. 100 strip 11    pantoprazole  (PROTONIX ) 40 MG tablet Take 1 tablet (40 mg total) by mouth daily. 90 tablet 1   rosuvastatin  (CRESTOR ) 10 MG tablet Take 1 tablet (10 mg total) by mouth daily. 90 tablet 3   sacubitril -valsartan  (ENTRESTO ) 24-26 MG TAKE 1 TABLET BY MOUTH TWICE A DAY 180 tablet 2   Semaglutide ,0.25 or 0.5MG /DOS, (OZEMPIC , 0.25 OR 0.5 MG/DOSE,) 2 MG/3ML SOPN Inject 0.5 mg into the skin once a week. 3 mL 2   spironolactone  (ALDACTONE ) 25 MG tablet TAKE 1 TABLET BY MOUTH EVERY DAY 90 tablet 3   thiamine  (VITAMIN B-1) 100 MG tablet Take 100 mg by mouth daily.     XIGDUO  XR 11-998 MG TB24 TAKE 1 TABLET BY MOUTH TWICE A DAY 180 tablet 1   No current facility-administered medications for this encounter.    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  Education: [employer].         Social Drivers of Health   Tobacco Use: Low Risk (07/29/2024)   Patient History    Smoking Tobacco Use: Never    Smokeless Tobacco Use: Never    Passive Exposure: Not on file  Financial Resource Strain: Patient Declined (07/05/2022)   Overall Financial Resource Strain (CARDIA)    Difficulty of Paying Living Expenses: Patient declined  Food Insecurity:  Unknown (07/05/2022)   Hunger Vital Sign    Worried About Running Out of Food in the Last Year: Not on file    Ran Out of Food in the Last Year: Never true  Transportation Needs: No Transportation Needs (07/05/2022)   PRAPARE - Administrator, Civil Service (Medical): No    Lack of Transportation (Non-Medical): No  Physical Activity: Unknown (07/05/2022)   Exercise Vital Sign    Days  of Exercise per Week: Patient declined    Minutes of Exercise per Session: Not on file  Stress: Not on file  Social Connections: Unknown (07/05/2022)   Social Connection and Isolation Panel    Frequency of Communication with Friends and Family: More than three times a week    Frequency of Social Gatherings with Friends and Family: Patient declined    Attends Religious Services: Patient declined    Active Member of Clubs or Organizations: Patient declined    Attends Banker Meetings: Not on file    Marital Status: Married  Catering Manager Violence: Not on file  Depression (PHQ2-9): Low Risk (04/11/2024)   Depression (PHQ2-9)    PHQ-2 Score: 0  Alcohol Screen: Not on file  Housing: Low Risk (11/17/2021)   Housing    Last Housing Risk Score: 0  Utilities: Not on file  Health Literacy: Not on file      Family History  Problem Relation Age of Onset   Heart disease Mother    COPD Mother    Emphysema Mother    Cancer Mother        oral   Heart disease Father    Diabetes Father    COPD Father    Vision loss Father    Skin cancer Father        skin   Hyperlipidemia Father    Hypertension Father    Heart disease Sister    Yvone' disease Sister    Diabetes Sister    Hyperlipidemia Sister    Heart failure Sister 37   Heart disease Brother    Hyperlipidemia Brother    Cancer Paternal Grandmother        unknown   Prostate cancer Paternal Grandfather        Prostate cancer   Breast cancer Neg Hx     Vitals:   07/29/24 1400  BP: (!) 116/58  Pulse: 73  SpO2: 98%   Weight: 63.6 kg (140 lb 3.2 oz)      Wt Readings from Last 3 Encounters:  07/29/24 63.6 kg (140 lb 3.2 oz)  04/11/24 63.6 kg (140 lb 4.8 oz)  02/14/24 63 kg (138 lb 14.4 oz)     PHYSICAL EXAM: General: NAD Neck: No JVD, no thyromegaly or thyroid  nodule.  Lungs: Clear to auscultation bilaterally with normal respiratory effort. CV: Nondisplaced PMI.  Heart regular S1/S2, no S3/S4, no murmur.  No peripheral edema.  No carotid bruit.  Normal pedal pulses.  Abdomen: Soft, nontender, no hepatosplenomegaly, no distention.  Skin: Intact without lesions or rashes.  Neurologic: Alert and oriented x 3.  Psych: Normal affect. Extremities: No clubbing or cyanosis.  HEENT: Normal.   ASSESSMENT & PLAN: 1. Chronic HF with recovered EF: Nonischemic cardiomyopathy by [facility] 06/2018. cMRI showed EF 28%, mildly decreased RV size, and no myocardial LGE. Echo in 7/20 showed EF up to 40-45% and echo in 4/21 showed EF up to 50%.  Echo in 4/22 with stable EF 45-50%. Echo in 4/24 with EF higher at 50-55%.  Echo today showed EF 50-55%, normal RV, mild-moderate MR, normal IVC (stable). NYHA class I-II, not volume overloaded on exam.  - She has not needed prn Lasix .  - Continue Toprol  XL 25 mg bid.  - Continue Entresto  24/26 mg bid.  - Continue spironolactone  25 mg daily.  - Continue Farxiga  10 mg daily.  - Continue Ivabradine  5 mg bid  - BMET today.  2. Hyperlipidemia: Insurance no longer covering Livalo .  Would  like her to stay on statin due to diabetes.  - Start Crestor  10 mg daily, lipids/LFTs in 2 months.   Follow up 6 months with APP.   I spent 32 minutes reviewing records, interviewing/examining patient, and managing orders.    Ezra Shuck, 07/29/2024 "

## 2024-07-30 ENCOUNTER — Ambulatory Visit (HOSPITAL_COMMUNITY): Payer: Self-pay | Admitting: Cardiology

## 2024-09-04 ENCOUNTER — Ambulatory Visit: Admitting: Internal Medicine

## 2024-09-04 ENCOUNTER — Encounter: Payer: Self-pay | Admitting: Internal Medicine

## 2024-09-04 VITALS — BP 110/70 | HR 80 | Temp 98.4°F | Wt 143.3 lb

## 2024-09-04 DIAGNOSIS — E785 Hyperlipidemia, unspecified: Secondary | ICD-10-CM

## 2024-09-04 DIAGNOSIS — I5042 Chronic combined systolic (congestive) and diastolic (congestive) heart failure: Secondary | ICD-10-CM

## 2024-09-04 DIAGNOSIS — K219 Gastro-esophageal reflux disease without esophagitis: Secondary | ICD-10-CM

## 2024-09-04 DIAGNOSIS — E119 Type 2 diabetes mellitus without complications: Secondary | ICD-10-CM

## 2024-09-04 DIAGNOSIS — Z13820 Encounter for screening for osteoporosis: Secondary | ICD-10-CM | POA: Insufficient documentation

## 2024-09-04 DIAGNOSIS — I428 Other cardiomyopathies: Secondary | ICD-10-CM

## 2024-09-04 DIAGNOSIS — E1169 Type 2 diabetes mellitus with other specified complication: Secondary | ICD-10-CM | POA: Insufficient documentation

## 2024-09-04 LAB — POCT GLYCOSYLATED HEMOGLOBIN (HGB A1C): Hemoglobin A1C: 6.4 % — AB (ref 4.0–5.6)

## 2024-09-04 NOTE — Assessment & Plan Note (Signed)
 Well-controlled with an A1c of 6.4.  Continue to work on lifestyle changes.

## 2024-09-04 NOTE — Assessment & Plan Note (Signed)
DEXA requested

## 2024-09-04 NOTE — Assessment & Plan Note (Signed)
 Has chronic combined heart failure followed by cardiology, currently asymptomatic.  Compensated.

## 2024-09-04 NOTE — Assessment & Plan Note (Signed)
 Fairly well-controlled with last LDL at 76.

## 2024-09-04 NOTE — Assessment & Plan Note (Signed)
 Well controlled on daily PPI therapy.

## 2024-09-04 NOTE — Progress Notes (Signed)
 "    Established Patient Office Visit     CC/Reason for Visit: 79-month follow-up chronic medical conditions  HPI: Emily [name] is a 64 y.o. female who is coming in today for the above mentioned reasons. Past Medical History is significant for: Type 2 diabetes, hyperlipidemia, nonischemic cardiomyopathy with combined CHF followed by cardiology, mitral valve regurgitation.  She is feeling well today.  Has no acute concerns or complaints.   Past Medical/Surgical History: Past Medical History:  Diagnosis Date   Acute combined systolic and diastolic heart failure (HCC) 06/16/2018   Allergy    CHF (congestive heart failure) (HCC)    GERD (gastroesophageal reflux disease)    NICM (nonischemic cardiomyopathy) (HCC) 06/21/2018   NSVT (nonsustained ventricular tachycardia) (HCC) 06/13/2015   PVC's (premature ventricular contractions) 06/11/2015   S/P cardiac cath, 06/20/18 06/21/2018   Type 2 diabetes mellitus (HCC)     Past Surgical History:  Procedure Laterality Date   CARDIAC CATHETERIZATION N/A 06/19/2015   Procedure: Left Heart Cath and Coronary Angiography;  Surgeon: Alm LELON Clay, MD;  Location: [facility] INVASIVE CV LAB;  Service: Cardiovascular;  Laterality: N/A;   RIGHT/LEFT HEART CATH AND CORONARY ANGIOGRAPHY N/A 06/19/2018   Procedure: RIGHT/LEFT HEART CATH AND CORONARY ANGIOGRAPHY;  Surgeon: Burnard Debby LABOR, MD;  Location: [facility] INVASIVE CV LAB;  Service: Cardiovascular;  Laterality: N/A;   TEE WITHOUT CARDIOVERSION N/A 06/20/2018   Procedure: TRANSESOPHAGEAL ECHOCARDIOGRAM (TEE);  Surgeon: Rolan Ezra RAMAN, MD;  [facility] ENDOSCOPY;  Service: Cardiovascular;  Laterality: N/A;   TONSILLECTOMY     when she was 3    Social History:  reports that she has never smoked. She has never used smokeless tobacco. She reports current alcohol use. She reports that she does not use drugs.  Allergies: Allergies[1]  Family History:  Family History  Problem Relation Age of  Onset   Heart disease Mother    COPD Mother    Emphysema Mother    Cancer Mother        oral   Heart disease Father    Diabetes Father    COPD Father    Vision loss Father    Skin cancer Father        skin   Hyperlipidemia Father    Hypertension Father    Heart disease Sister    Yvone' disease Sister    Diabetes Sister    Hyperlipidemia Sister    Heart failure Sister 39   Heart disease Brother    Hyperlipidemia Brother    Cancer Paternal Grandmother        unknown   Prostate cancer Paternal Grandfather        Prostate cancer   Breast cancer Neg Hx     Current Medications[2]  Review of Systems:  Negative unless indicated in HPI.   Physical Exam: Vitals:   09/04/24 0811  BP: 110/70  Pulse: 80  Temp: 98.4 F (36.9 C)  TempSrc: Oral  SpO2: 98%  Weight: 143 lb 4.8 oz (65 kg)    Body mass index is 28.22 kg/m.   Physical Exam Vitals reviewed.  Constitutional:      Appearance: Normal appearance.  HENT:     Head: Normocephalic and atraumatic.  Eyes:     Conjunctiva/sclera: Conjunctivae normal.  Cardiovascular:     Rate and Rhythm: Normal rate and regular rhythm.  Pulmonary:     Effort: Pulmonary effort is normal.     Breath sounds: Normal breath sounds.  Skin:  General: Skin is warm and dry.  Neurological:     General: No focal deficit present.     Mental Status: She is alert and oriented to person, place, and time.  Psychiatric:        Mood and Affect: Mood normal.        Behavior: Behavior normal.        Thought Content: Thought content normal.        Judgment: Judgment normal.      Impression and Plan:  Non-insulin  treated type 2 diabetes mellitus (HCC) Assessment & Plan: Well-controlled with an A1c of 6.4.  Continue to work on lifestyle changes.  Orders: -     POCT glycosylated hemoglobin (Hb A1C)  Screening for osteoporosis Assessment & Plan: DEXA requested.  Orders: -     DG Bone Density; Future  Gastroesophageal reflux  disease, unspecified whether esophagitis present Assessment & Plan: Well-controlled on daily PPI therapy.   Chronic combined systolic and diastolic CHF (congestive heart failure) (HCC) Assessment & Plan: Has chronic combined heart failure followed by cardiology, currently asymptomatic.  Compensated.   NICM (nonischemic cardiomyopathy) (HCC)  Hyperlipidemia associated with type 2 diabetes mellitus (HCC) Assessment & Plan: Fairly well-controlled with last LDL at 76.      Time spent:32 minutes reviewing chart, interviewing and examining patient and formulating plan of care.     Tully Theophilus Andrews, MD [facility]     [1]  Allergies Allergen Reactions   Hornet Venom Swelling   Metoprolol      depression   Tetanus Toxoid-Containing Vaccines Other (See Comments)    Big red rash and swelling in the muscle  [2]  Current Outpatient Medications:    acetaminophen  (TYLENOL ) 500 MG tablet, Take 500 mg by mouth every 6 (six) hours as needed for moderate pain., Disp: , Rfl:    aspirin  81 MG tablet, Take 81 mg by mouth daily., Disp: , Rfl:    Blood Glucose Monitoring Suppl (ONE TOUCH ULTRA 2) w/Device KIT, USE TO TEST UP TO 4 TIMES PER DAY., Disp: 1 each, Rfl: 1   cholecalciferol  (VITAMIN D ) 1000 UNITS tablet, Take 1,000 Units by mouth daily., Disp: , Rfl:    furosemide  (LASIX ) 20 MG tablet, Take 20 mg by mouth as needed., Disp: , Rfl:    glucosamine-chondroitin 500-400 MG tablet, Take 1 tablet by mouth 3 (three) times daily., Disp: , Rfl:    Guaifenesin  (MUCINEX  MAXIMUM STRENGTH) 1200 MG TB12, Take 1 tablet (1,200 mg total) by mouth every 12 (twelve) hours as needed., Disp: 14 tablet, Rfl: 1   ivabradine  (CORLANOR) 5 MG TABS tablet, TAKE 1 TABLET BY MOUTH TWICE A DAY, Disp: 180 tablet, Rfl: 2   metoprolol  succinate (TOPROL -XL) 25 MG 24 hr tablet, TAKE 2 TABLETS (50 MG TOTAL) BY MOUTH EVERY MORNING AND 1 TABLET (25 MG TOTAL) EVERY EVENING, Disp: 270 tablet,  Rfl: 3   Multiple Vitamins-Minerals (MULTIVITAMIN WITH MINERALS) tablet, Take 1 tablet by mouth daily., Disp: , Rfl:    nitroGLYCERIN  (NITROSTAT ) 0.4 MG SL tablet, Place 1 tablet (0.4 mg total) under the tongue every 5 (five) minutes as needed for chest pain., Disp: 25 tablet, Rfl: 1   ONETOUCH ULTRA test strip, USE TO TEST UP TO 4 TIMES A DAY., Disp: 100 strip, Rfl: 11   pantoprazole  (PROTONIX ) 40 MG tablet, Take 1 tablet (40 mg total) by mouth daily., Disp: 90 tablet, Rfl: 1   rosuvastatin  (CRESTOR ) 10 MG tablet, Take 1 tablet (10 mg total)  by mouth daily., Disp: 90 tablet, Rfl: 3   sacubitril -valsartan  (ENTRESTO ) 24-26 MG, TAKE 1 TABLET BY MOUTH TWICE A DAY, Disp: 180 tablet, Rfl: 2   Semaglutide ,0.25 or 0.5MG /DOS, (OZEMPIC , 0.25 OR 0.5 MG/DOSE,) 2 MG/3ML SOPN, Inject 0.5 mg into the skin once a week., Disp: 3 mL, Rfl: 2   spironolactone  (ALDACTONE ) 25 MG tablet, TAKE 1 TABLET BY MOUTH EVERY DAY, Disp: 90 tablet, Rfl: 3   thiamine  (VITAMIN B-1) 100 MG tablet, Take 100 mg by mouth daily., Disp: , Rfl:    Turmeric (QC TUMERIC COMPLEX PO), Take by mouth., Disp: , Rfl:    XIGDUO  XR 11-998 MG TB24, TAKE 1 TABLET BY MOUTH TWICE A DAY, Disp: 180 tablet, Rfl: 1  "

## 2024-09-27 ENCOUNTER — Ambulatory Visit (HOSPITAL_COMMUNITY)

## 2025-01-29 ENCOUNTER — Ambulatory Visit (HOSPITAL_COMMUNITY)
